# Patient Record
Sex: Male | Born: 1940
Health system: Southern US, Community
[De-identification: ages and names within clinical notes are randomized; demographics above are authoritative.]

## PROBLEM LIST (undated history)

## (undated) DIAGNOSIS — E785 Hyperlipidemia, unspecified: Secondary | ICD-10-CM

## (undated) DIAGNOSIS — C801 Malignant (primary) neoplasm, unspecified: Secondary | ICD-10-CM

## (undated) DIAGNOSIS — I428 Other cardiomyopathies: Secondary | ICD-10-CM

## (undated) DIAGNOSIS — I10 Essential (primary) hypertension: Secondary | ICD-10-CM

## (undated) DIAGNOSIS — E7800 Pure hypercholesterolemia, unspecified: Secondary | ICD-10-CM

## (undated) DIAGNOSIS — C8590 Non-Hodgkin lymphoma, unspecified, unspecified site: Secondary | ICD-10-CM

## (undated) DIAGNOSIS — M1990 Unspecified osteoarthritis, unspecified site: Secondary | ICD-10-CM

## (undated) HISTORY — DX: Hyperlipidemia, unspecified: E78.5

## (undated) HISTORY — DX: Other cardiomyopathies: I42.8

## (undated) HISTORY — DX: Non-Hodgkin lymphoma, unspecified, unspecified site: C85.90

## (undated) HISTORY — PX: THYROIDECTOMY, PARTIAL: SHX18

## (undated) HISTORY — DX: Pure hypercholesterolemia, unspecified: E78.00

## (undated) HISTORY — PX: OTHER SURGICAL HISTORY: SHX169

## (undated) HISTORY — DX: Essential (primary) hypertension: I10

## (undated) HISTORY — DX: Unspecified osteoarthritis, unspecified site: M19.90

## (undated) MED FILL — Rituximab-pvvr IV Soln 500 MG/50ML (10 MG/ML): INTRAVENOUS | Qty: 80 | Status: AC

---

## 2007-11-25 ENCOUNTER — Ambulatory Visit: Payer: Self-pay | Admitting: Oncology

## 2007-12-19 LAB — CBC WITH DIFFERENTIAL/PLATELET
BASO%: 0.5 % (ref 0.0–2.0)
Basophils Absolute: 0 10*3/uL (ref 0.0–0.1)
EOS%: 2.2 % (ref 0.0–7.0)
Eosinophils Absolute: 0.1 10*3/uL (ref 0.0–0.5)
HCT: 39.8 % (ref 38.7–49.9)
HGB: 14.2 g/dL (ref 13.0–17.1)
LYMPH%: 23 % (ref 14.0–48.0)
MCH: 31.5 pg (ref 28.0–33.4)
MCHC: 35.7 g/dL (ref 32.0–35.9)
MCV: 88.3 fL (ref 81.6–98.0)
MONO#: 0.7 10*3/uL (ref 0.1–0.9)
MONO%: 12.4 % (ref 0.0–13.0)
NEUT#: 3.6 10*3/uL (ref 1.5–6.5)
NEUT%: 61.9 % (ref 40.0–75.0)
Platelets: 204 10*3/uL (ref 145–400)
RBC: 4.51 10*6/uL (ref 4.20–5.71)
RDW: 14.2 % (ref 11.2–14.6)
WBC: 5.9 10*3/uL (ref 4.0–10.0)
lymph#: 1.4 10*3/uL (ref 0.9–3.3)

## 2007-12-19 LAB — COMPREHENSIVE METABOLIC PANEL
ALT: 25 U/L (ref 0–53)
AST: 14 U/L (ref 0–37)
Albumin: 4.5 g/dL (ref 3.5–5.2)
Alkaline Phosphatase: 47 U/L (ref 39–117)
BUN: 21 mg/dL (ref 6–23)
CO2: 26 mEq/L (ref 19–32)
Calcium: 8.9 mg/dL (ref 8.4–10.5)
Chloride: 104 mEq/L (ref 96–112)
Creatinine, Ser: 1.13 mg/dL (ref 0.40–1.50)
Glucose, Bld: 91 mg/dL (ref 70–99)
Potassium: 4 mEq/L (ref 3.5–5.3)
Sodium: 141 mEq/L (ref 135–145)
Total Bilirubin: 0.4 mg/dL (ref 0.3–1.2)
Total Protein: 6.6 g/dL (ref 6.0–8.3)

## 2007-12-19 LAB — LACTATE DEHYDROGENASE: LDH: 150 U/L (ref 94–250)

## 2007-12-19 LAB — URIC ACID: Uric Acid, Serum: 8.1 mg/dL — ABNORMAL HIGH (ref 4.0–7.8)

## 2007-12-27 ENCOUNTER — Ambulatory Visit: Admission: RE | Admit: 2007-12-27 | Discharge: 2007-12-27 | Payer: Self-pay | Admitting: Oncology

## 2007-12-27 ENCOUNTER — Encounter (HOSPITAL_COMMUNITY): Payer: Self-pay | Admitting: Oncology

## 2007-12-28 ENCOUNTER — Ambulatory Visit (HOSPITAL_COMMUNITY): Admission: RE | Admit: 2007-12-28 | Discharge: 2007-12-28 | Payer: Self-pay | Admitting: Oncology

## 2008-01-26 ENCOUNTER — Ambulatory Visit (HOSPITAL_COMMUNITY): Admission: RE | Admit: 2008-01-26 | Discharge: 2008-01-26 | Payer: Self-pay | Admitting: Oncology

## 2008-02-01 ENCOUNTER — Ambulatory Visit: Payer: Self-pay | Admitting: Oncology

## 2008-02-03 LAB — COMPREHENSIVE METABOLIC PANEL
ALT: 28 U/L (ref 0–53)
AST: 14 U/L (ref 0–37)
Albumin: 4.3 g/dL (ref 3.5–5.2)
Alkaline Phosphatase: 54 U/L (ref 39–117)
BUN: 19 mg/dL (ref 6–23)
CO2: 26 mEq/L (ref 19–32)
Calcium: 9.2 mg/dL (ref 8.4–10.5)
Chloride: 104 mEq/L (ref 96–112)
Creatinine, Ser: 1.04 mg/dL (ref 0.40–1.50)
Glucose, Bld: 102 mg/dL — ABNORMAL HIGH (ref 70–99)
Potassium: 4 mEq/L (ref 3.5–5.3)
Sodium: 138 mEq/L (ref 135–145)
Total Bilirubin: 0.5 mg/dL (ref 0.3–1.2)
Total Protein: 6.7 g/dL (ref 6.0–8.3)

## 2008-02-03 LAB — CBC WITH DIFFERENTIAL/PLATELET
BASO%: 0.4 % (ref 0.0–2.0)
Basophils Absolute: 0 10*3/uL (ref 0.0–0.1)
EOS%: 2 % (ref 0.0–7.0)
Eosinophils Absolute: 0.1 10*3/uL (ref 0.0–0.5)
HCT: 42.8 % (ref 38.7–49.9)
HGB: 15 g/dL (ref 13.0–17.1)
LYMPH%: 21.1 % (ref 14.0–48.0)
MCH: 31.9 pg (ref 28.0–33.4)
MCHC: 35 g/dL (ref 32.0–35.9)
MCV: 91.3 fL (ref 81.6–98.0)
MONO#: 0.7 10*3/uL (ref 0.1–0.9)
MONO%: 13.2 % — ABNORMAL HIGH (ref 0.0–13.0)
NEUT#: 3.4 10*3/uL (ref 1.5–6.5)
NEUT%: 63.3 % (ref 40.0–75.0)
Platelets: 178 10*3/uL (ref 145–400)
RBC: 4.69 10*6/uL (ref 4.20–5.71)
RDW: 13.9 % (ref 11.2–14.6)
WBC: 5.4 10*3/uL (ref 4.0–10.0)
lymph#: 1.1 10*3/uL (ref 0.9–3.3)

## 2008-02-03 LAB — ERYTHROCYTE SEDIMENTATION RATE: Sed Rate: 0 mm/hr (ref 0–20)

## 2008-02-03 LAB — TSH: TSH: 1.706 u[IU]/mL (ref 0.350–4.500)

## 2008-02-03 LAB — LACTATE DEHYDROGENASE: LDH: 143 U/L (ref 94–250)

## 2008-02-15 LAB — CBC WITH DIFFERENTIAL/PLATELET
BASO%: 1 % (ref 0.0–2.0)
Basophils Absolute: 0.1 10*3/uL (ref 0.0–0.1)
EOS%: 2.5 % (ref 0.0–7.0)
Eosinophils Absolute: 0.1 10*3/uL (ref 0.0–0.5)
HCT: 39.9 % (ref 38.7–49.9)
HGB: 14.1 g/dL (ref 13.0–17.1)
LYMPH%: 24.4 % (ref 14.0–48.0)
MCH: 31.4 pg (ref 28.0–33.4)
MCHC: 35.4 g/dL (ref 32.0–35.9)
MCV: 88.6 fL (ref 81.6–98.0)
MONO#: 0.7 10*3/uL (ref 0.1–0.9)
MONO%: 13.9 % — ABNORMAL HIGH (ref 0.0–13.0)
NEUT#: 3.1 10*3/uL (ref 1.5–6.5)
NEUT%: 58.3 % (ref 40.0–75.0)
Platelets: 197 10*3/uL (ref 145–400)
RBC: 4.5 10*6/uL (ref 4.20–5.71)
RDW: 12.4 % (ref 11.2–14.6)
WBC: 5.3 10*3/uL (ref 4.0–10.0)
lymph#: 1.3 10*3/uL (ref 0.9–3.3)

## 2008-02-15 LAB — COMPREHENSIVE METABOLIC PANEL
ALT: 31 U/L (ref 0–53)
AST: 15 U/L (ref 0–37)
Albumin: 4.2 g/dL (ref 3.5–5.2)
Alkaline Phosphatase: 49 U/L (ref 39–117)
BUN: 18 mg/dL (ref 6–23)
CO2: 26 mEq/L (ref 19–32)
Calcium: 8.8 mg/dL (ref 8.4–10.5)
Chloride: 104 mEq/L (ref 96–112)
Creatinine, Ser: 0.99 mg/dL (ref 0.40–1.50)
Glucose, Bld: 83 mg/dL (ref 70–99)
Potassium: 4.4 mEq/L (ref 3.5–5.3)
Sodium: 139 mEq/L (ref 135–145)
Total Bilirubin: 0.5 mg/dL (ref 0.3–1.2)
Total Protein: 6.3 g/dL (ref 6.0–8.3)

## 2008-02-15 LAB — LACTATE DEHYDROGENASE: LDH: 144 U/L (ref 94–250)

## 2008-04-06 ENCOUNTER — Ambulatory Visit: Payer: Self-pay | Admitting: Oncology

## 2008-04-10 LAB — COMPREHENSIVE METABOLIC PANEL
ALT: 37 U/L (ref 0–53)
AST: 16 U/L (ref 0–37)
Albumin: 4.4 g/dL (ref 3.5–5.2)
Alkaline Phosphatase: 51 U/L (ref 39–117)
BUN: 16 mg/dL (ref 6–23)
CO2: 24 mEq/L (ref 19–32)
Calcium: 9.3 mg/dL (ref 8.4–10.5)
Chloride: 104 mEq/L (ref 96–112)
Creatinine, Ser: 1.04 mg/dL (ref 0.40–1.50)
Glucose, Bld: 98 mg/dL (ref 70–99)
Potassium: 4.3 mEq/L (ref 3.5–5.3)
Sodium: 139 mEq/L (ref 135–145)
Total Bilirubin: 0.5 mg/dL (ref 0.3–1.2)
Total Protein: 6.6 g/dL (ref 6.0–8.3)

## 2008-04-10 LAB — CBC WITH DIFFERENTIAL/PLATELET
BASO%: 0.8 % (ref 0.0–2.0)
Basophils Absolute: 0 10*3/uL (ref 0.0–0.1)
EOS%: 2.7 % (ref 0.0–7.0)
Eosinophils Absolute: 0.2 10*3/uL (ref 0.0–0.5)
HCT: 42.5 % (ref 38.7–49.9)
HGB: 15 g/dL (ref 13.0–17.1)
LYMPH%: 23.7 % (ref 14.0–48.0)
MCH: 30.9 pg (ref 28.0–33.4)
MCHC: 35.4 g/dL (ref 32.0–35.9)
MCV: 87.5 fL (ref 81.6–98.0)
MONO#: 0.8 10*3/uL (ref 0.1–0.9)
MONO%: 12.7 % (ref 0.0–13.0)
NEUT#: 3.7 10*3/uL (ref 1.5–6.5)
NEUT%: 60.1 % (ref 40.0–75.0)
Platelets: 213 10*3/uL (ref 145–400)
RBC: 4.85 10*6/uL (ref 4.20–5.71)
RDW: 12.3 % (ref 11.2–14.6)
WBC: 6.2 10*3/uL (ref 4.0–10.0)
lymph#: 1.5 10*3/uL (ref 0.9–3.3)

## 2008-04-10 LAB — LACTATE DEHYDROGENASE: LDH: 152 U/L (ref 94–250)

## 2008-06-05 ENCOUNTER — Ambulatory Visit (HOSPITAL_COMMUNITY): Admission: RE | Admit: 2008-06-05 | Discharge: 2008-06-05 | Payer: Self-pay | Admitting: Oncology

## 2008-06-08 ENCOUNTER — Ambulatory Visit: Payer: Self-pay | Admitting: Oncology

## 2008-06-12 LAB — CBC WITH DIFFERENTIAL/PLATELET
BASO%: 1.3 % (ref 0.0–2.0)
Basophils Absolute: 0.1 10*3/uL (ref 0.0–0.1)
EOS%: 2.1 % (ref 0.0–7.0)
Eosinophils Absolute: 0.1 10*3/uL (ref 0.0–0.5)
HCT: 43.4 % (ref 38.7–49.9)
HGB: 15.4 g/dL (ref 13.0–17.1)
LYMPH%: 26.8 % (ref 14.0–48.0)
MCH: 31.1 pg (ref 28.0–33.4)
MCHC: 35.4 g/dL (ref 32.0–35.9)
MCV: 87.9 fL (ref 81.6–98.0)
MONO#: 0.8 10*3/uL (ref 0.1–0.9)
MONO%: 13 % (ref 0.0–13.0)
NEUT#: 3.4 10*3/uL (ref 1.5–6.5)
NEUT%: 56.8 % (ref 40.0–75.0)
Platelets: 213 10*3/uL (ref 145–400)
RBC: 4.94 10*6/uL (ref 4.20–5.71)
RDW: 12.2 % (ref 11.2–14.6)
WBC: 5.9 10*3/uL (ref 4.0–10.0)
lymph#: 1.6 10*3/uL (ref 0.9–3.3)

## 2008-06-12 LAB — COMPREHENSIVE METABOLIC PANEL
ALT: 32 U/L (ref 0–53)
AST: 13 U/L (ref 0–37)
Albumin: 4.3 g/dL (ref 3.5–5.2)
Alkaline Phosphatase: 47 U/L (ref 39–117)
BUN: 21 mg/dL (ref 6–23)
CO2: 25 mEq/L (ref 19–32)
Calcium: 9.2 mg/dL (ref 8.4–10.5)
Chloride: 103 mEq/L (ref 96–112)
Creatinine, Ser: 0.99 mg/dL (ref 0.40–1.50)
Glucose, Bld: 96 mg/dL (ref 70–99)
Potassium: 4.4 mEq/L (ref 3.5–5.3)
Sodium: 140 mEq/L (ref 135–145)
Total Bilirubin: 0.6 mg/dL (ref 0.3–1.2)
Total Protein: 6.8 g/dL (ref 6.0–8.3)

## 2008-06-12 LAB — LACTATE DEHYDROGENASE: LDH: 142 U/L (ref 94–250)

## 2008-08-07 ENCOUNTER — Ambulatory Visit (HOSPITAL_COMMUNITY): Admission: RE | Admit: 2008-08-07 | Discharge: 2008-08-07 | Payer: Self-pay | Admitting: Oncology

## 2008-08-09 ENCOUNTER — Ambulatory Visit: Payer: Self-pay | Admitting: Oncology

## 2008-08-13 LAB — COMPREHENSIVE METABOLIC PANEL
ALT: 24 U/L (ref 0–53)
AST: 13 U/L (ref 0–37)
Albumin: 4.4 g/dL (ref 3.5–5.2)
Alkaline Phosphatase: 47 U/L (ref 39–117)
BUN: 28 mg/dL — ABNORMAL HIGH (ref 6–23)
CO2: 24 mEq/L (ref 19–32)
Calcium: 8.9 mg/dL (ref 8.4–10.5)
Chloride: 105 mEq/L (ref 96–112)
Creatinine, Ser: 1.08 mg/dL (ref 0.40–1.50)
Glucose, Bld: 85 mg/dL (ref 70–99)
Potassium: 4.5 mEq/L (ref 3.5–5.3)
Sodium: 139 mEq/L (ref 135–145)
Total Bilirubin: 0.5 mg/dL (ref 0.3–1.2)
Total Protein: 6.7 g/dL (ref 6.0–8.3)

## 2008-08-13 LAB — CBC WITH DIFFERENTIAL/PLATELET
BASO%: 0.6 % (ref 0.0–2.0)
Basophils Absolute: 0 10*3/uL (ref 0.0–0.1)
EOS%: 1.5 % (ref 0.0–7.0)
Eosinophils Absolute: 0.1 10*3/uL (ref 0.0–0.5)
HCT: 44.1 % (ref 38.4–49.9)
HGB: 15.5 g/dL (ref 13.0–17.1)
LYMPH%: 24.2 % (ref 14.0–49.0)
MCH: 30.8 pg (ref 27.2–33.4)
MCHC: 35.1 g/dL (ref 32.0–36.0)
MCV: 87.7 fL (ref 79.3–98.0)
MONO#: 0.8 10*3/uL (ref 0.1–0.9)
MONO%: 14.3 % — ABNORMAL HIGH (ref 0.0–14.0)
NEUT#: 3.2 10*3/uL (ref 1.5–6.5)
NEUT%: 59.4 % (ref 39.0–75.0)
Platelets: 165 10*3/uL (ref 140–400)
RBC: 5.03 10*6/uL (ref 4.20–5.82)
RDW: 13.4 % (ref 11.0–14.6)
WBC: 5.3 10*3/uL (ref 4.0–10.3)
lymph#: 1.3 10*3/uL (ref 0.9–3.3)
nRBC: 0 % (ref 0–0)

## 2008-08-13 LAB — LACTATE DEHYDROGENASE: LDH: 163 U/L (ref 94–250)

## 2008-10-09 ENCOUNTER — Ambulatory Visit: Payer: Self-pay | Admitting: Oncology

## 2008-10-11 LAB — CBC WITH DIFFERENTIAL/PLATELET
BASO%: 0.5 % (ref 0.0–2.0)
Basophils Absolute: 0 10*3/uL (ref 0.0–0.1)
EOS%: 1.4 % (ref 0.0–7.0)
Eosinophils Absolute: 0.1 10*3/uL (ref 0.0–0.5)
HCT: 42.7 % (ref 38.4–49.9)
HGB: 15.2 g/dL (ref 13.0–17.1)
LYMPH%: 30.2 % (ref 14.0–49.0)
MCH: 31 pg (ref 27.2–33.4)
MCHC: 35.6 g/dL (ref 32.0–36.0)
MCV: 87.1 fL (ref 79.3–98.0)
MONO#: 0.5 10*3/uL (ref 0.1–0.9)
MONO%: 11.8 % (ref 0.0–14.0)
NEUT#: 2.4 10*3/uL (ref 1.5–6.5)
NEUT%: 56.1 % (ref 39.0–75.0)
Platelets: 149 10*3/uL (ref 140–400)
RBC: 4.9 10*6/uL (ref 4.20–5.82)
RDW: 13.3 % (ref 11.0–14.6)
WBC: 4.2 10*3/uL (ref 4.0–10.3)
lymph#: 1.3 10*3/uL (ref 0.9–3.3)

## 2008-10-11 LAB — COMPREHENSIVE METABOLIC PANEL
ALT: 16 U/L (ref 0–53)
AST: 10 U/L (ref 0–37)
Albumin: 4.5 g/dL (ref 3.5–5.2)
Alkaline Phosphatase: 51 U/L (ref 39–117)
BUN: 17 mg/dL (ref 6–23)
CO2: 24 mEq/L (ref 19–32)
Calcium: 9 mg/dL (ref 8.4–10.5)
Chloride: 102 mEq/L (ref 96–112)
Creatinine, Ser: 0.98 mg/dL (ref 0.40–1.50)
Glucose, Bld: 90 mg/dL (ref 70–99)
Potassium: 4.2 mEq/L (ref 3.5–5.3)
Sodium: 139 mEq/L (ref 135–145)
Total Bilirubin: 0.6 mg/dL (ref 0.3–1.2)
Total Protein: 6.6 g/dL (ref 6.0–8.3)

## 2008-10-11 LAB — LACTATE DEHYDROGENASE: LDH: 128 U/L (ref 94–250)

## 2008-12-07 ENCOUNTER — Ambulatory Visit: Payer: Self-pay | Admitting: Oncology

## 2008-12-11 LAB — COMPREHENSIVE METABOLIC PANEL
ALT: 17 U/L (ref 0–53)
AST: 11 U/L (ref 0–37)
Albumin: 4.3 g/dL (ref 3.5–5.2)
Alkaline Phosphatase: 50 U/L (ref 39–117)
BUN: 23 mg/dL (ref 6–23)
CO2: 25 mEq/L (ref 19–32)
Calcium: 9.5 mg/dL (ref 8.4–10.5)
Chloride: 104 mEq/L (ref 96–112)
Creatinine, Ser: 1.05 mg/dL (ref 0.40–1.50)
Glucose, Bld: 85 mg/dL (ref 70–99)
Potassium: 4.4 mEq/L (ref 3.5–5.3)
Sodium: 139 mEq/L (ref 135–145)
Total Bilirubin: 0.5 mg/dL (ref 0.3–1.2)
Total Protein: 6.6 g/dL (ref 6.0–8.3)

## 2008-12-11 LAB — CBC WITH DIFFERENTIAL/PLATELET
BASO%: 0.5 % (ref 0.0–2.0)
Basophils Absolute: 0 10*3/uL (ref 0.0–0.1)
EOS%: 1.7 % (ref 0.0–7.0)
Eosinophils Absolute: 0.1 10*3/uL (ref 0.0–0.5)
HCT: 41.8 % (ref 38.4–49.9)
HGB: 14.9 g/dL (ref 13.0–17.1)
LYMPH%: 27.8 % (ref 14.0–49.0)
MCH: 31.9 pg (ref 27.2–33.4)
MCHC: 35.8 g/dL (ref 32.0–36.0)
MCV: 89 fL (ref 79.3–98.0)
MONO#: 0.5 10*3/uL (ref 0.1–0.9)
MONO%: 10.1 % (ref 0.0–14.0)
NEUT#: 3.1 10*3/uL (ref 1.5–6.5)
NEUT%: 59.9 % (ref 39.0–75.0)
Platelets: 161 10*3/uL (ref 140–400)
RBC: 4.69 10*6/uL (ref 4.20–5.82)
RDW: 13.6 % (ref 11.0–14.6)
WBC: 5.1 10*3/uL (ref 4.0–10.3)
lymph#: 1.4 10*3/uL (ref 0.9–3.3)

## 2008-12-11 LAB — LACTATE DEHYDROGENASE: LDH: 128 U/L (ref 94–250)

## 2009-02-12 ENCOUNTER — Ambulatory Visit (HOSPITAL_COMMUNITY): Admission: RE | Admit: 2009-02-12 | Discharge: 2009-02-12 | Payer: Self-pay | Admitting: Oncology

## 2009-02-12 ENCOUNTER — Ambulatory Visit: Payer: Self-pay | Admitting: Oncology

## 2009-02-14 LAB — CBC WITH DIFFERENTIAL/PLATELET
BASO%: 0.5 % (ref 0.0–2.0)
Basophils Absolute: 0 10*3/uL (ref 0.0–0.1)
EOS%: 1.3 % (ref 0.0–7.0)
Eosinophils Absolute: 0.1 10*3/uL (ref 0.0–0.5)
HCT: 42.9 % (ref 38.4–49.9)
HGB: 14.9 g/dL (ref 13.0–17.1)
LYMPH%: 30.6 % (ref 14.0–49.0)
MCH: 30.5 pg (ref 27.2–33.4)
MCHC: 34.7 g/dL (ref 32.0–36.0)
MCV: 87.7 fL (ref 79.3–98.0)
MONO#: 0.6 10*3/uL (ref 0.1–0.9)
MONO%: 11.5 % (ref 0.0–14.0)
NEUT#: 3.1 10*3/uL (ref 1.5–6.5)
NEUT%: 56.1 % (ref 39.0–75.0)
Platelets: 141 10*3/uL (ref 140–400)
RBC: 4.89 10*6/uL (ref 4.20–5.82)
RDW: 13.7 % (ref 11.0–14.6)
WBC: 5.5 10*3/uL (ref 4.0–10.3)
lymph#: 1.7 10*3/uL (ref 0.9–3.3)
nRBC: 0 % (ref 0–0)

## 2009-02-14 LAB — COMPREHENSIVE METABOLIC PANEL
ALT: 18 U/L (ref 0–53)
AST: 11 U/L (ref 0–37)
Albumin: 4.2 g/dL (ref 3.5–5.2)
Alkaline Phosphatase: 44 U/L (ref 39–117)
BUN: 21 mg/dL (ref 6–23)
CO2: 25 mEq/L (ref 19–32)
Calcium: 9 mg/dL (ref 8.4–10.5)
Chloride: 103 mEq/L (ref 96–112)
Creatinine, Ser: 0.95 mg/dL (ref 0.40–1.50)
Glucose, Bld: 93 mg/dL (ref 70–99)
Potassium: 4.2 mEq/L (ref 3.5–5.3)
Sodium: 139 mEq/L (ref 135–145)
Total Bilirubin: 0.7 mg/dL (ref 0.3–1.2)
Total Protein: 6.4 g/dL (ref 6.0–8.3)

## 2009-02-14 LAB — LACTATE DEHYDROGENASE: LDH: 137 U/L (ref 94–250)

## 2009-03-04 ENCOUNTER — Ambulatory Visit (HOSPITAL_COMMUNITY): Admission: RE | Admit: 2009-03-04 | Discharge: 2009-03-05 | Payer: Self-pay | Admitting: Otolaryngology

## 2009-03-04 ENCOUNTER — Encounter (INDEPENDENT_AMBULATORY_CARE_PROVIDER_SITE_OTHER): Payer: Self-pay | Admitting: Otolaryngology

## 2009-04-12 ENCOUNTER — Ambulatory Visit: Payer: Self-pay | Admitting: Oncology

## 2009-04-16 LAB — CBC WITH DIFFERENTIAL/PLATELET
BASO%: 0.7 % (ref 0.0–2.0)
Basophils Absolute: 0 10*3/uL (ref 0.0–0.1)
EOS%: 2 % (ref 0.0–7.0)
Eosinophils Absolute: 0.1 10*3/uL (ref 0.0–0.5)
HCT: 43.7 % (ref 38.4–49.9)
HGB: 15.1 g/dL (ref 13.0–17.1)
LYMPH%: 29.4 % (ref 14.0–49.0)
MCH: 30.6 pg (ref 27.2–33.4)
MCHC: 34.6 g/dL (ref 32.0–36.0)
MCV: 88.6 fL (ref 79.3–98.0)
MONO#: 0.5 10*3/uL (ref 0.1–0.9)
MONO%: 9.3 % (ref 0.0–14.0)
NEUT#: 3.3 10*3/uL (ref 1.5–6.5)
NEUT%: 58.6 % (ref 39.0–75.0)
Platelets: 169 10*3/uL (ref 140–400)
RBC: 4.93 10*6/uL (ref 4.20–5.82)
RDW: 13.8 % (ref 11.0–14.6)
WBC: 5.6 10*3/uL (ref 4.0–10.3)
lymph#: 1.6 10*3/uL (ref 0.9–3.3)
nRBC: 0 % (ref 0–0)

## 2009-04-16 LAB — LACTATE DEHYDROGENASE: LDH: 138 U/L (ref 94–250)

## 2009-04-16 LAB — COMPREHENSIVE METABOLIC PANEL
ALT: 12 U/L (ref 0–53)
AST: 9 U/L (ref 0–37)
Albumin: 4.3 g/dL (ref 3.5–5.2)
Alkaline Phosphatase: 49 U/L (ref 39–117)
BUN: 19 mg/dL (ref 6–23)
CO2: 26 mEq/L (ref 19–32)
Calcium: 9.1 mg/dL (ref 8.4–10.5)
Chloride: 105 mEq/L (ref 96–112)
Creatinine, Ser: 1.18 mg/dL (ref 0.40–1.50)
Glucose, Bld: 92 mg/dL (ref 70–99)
Potassium: 4.1 mEq/L (ref 3.5–5.3)
Sodium: 141 mEq/L (ref 135–145)
Total Bilirubin: 0.4 mg/dL (ref 0.3–1.2)
Total Protein: 6.4 g/dL (ref 6.0–8.3)

## 2009-06-17 ENCOUNTER — Ambulatory Visit: Payer: Self-pay | Admitting: Oncology

## 2009-06-20 LAB — COMPREHENSIVE METABOLIC PANEL
ALT: 15 U/L (ref 0–53)
AST: 10 U/L (ref 0–37)
Albumin: 4.4 g/dL (ref 3.5–5.2)
Alkaline Phosphatase: 45 U/L (ref 39–117)
BUN: 16 mg/dL (ref 6–23)
CO2: 26 mEq/L (ref 19–32)
Calcium: 9.1 mg/dL (ref 8.4–10.5)
Chloride: 103 mEq/L (ref 96–112)
Creatinine, Ser: 0.95 mg/dL (ref 0.40–1.50)
Glucose, Bld: 88 mg/dL (ref 70–99)
Potassium: 4.2 mEq/L (ref 3.5–5.3)
Sodium: 139 mEq/L (ref 135–145)
Total Bilirubin: 0.5 mg/dL (ref 0.3–1.2)
Total Protein: 6.5 g/dL (ref 6.0–8.3)

## 2009-06-20 LAB — CBC WITH DIFFERENTIAL/PLATELET
BASO%: 0.5 % (ref 0.0–2.0)
Basophils Absolute: 0 10*3/uL (ref 0.0–0.1)
EOS%: 2.3 % (ref 0.0–7.0)
Eosinophils Absolute: 0.1 10*3/uL (ref 0.0–0.5)
HCT: 45.4 % (ref 38.4–49.9)
HGB: 15.6 g/dL (ref 13.0–17.1)
LYMPH%: 29 % (ref 14.0–49.0)
MCH: 30.5 pg (ref 27.2–33.4)
MCHC: 34.4 g/dL (ref 32.0–36.0)
MCV: 88.8 fL (ref 79.3–98.0)
MONO#: 0.7 10*3/uL (ref 0.1–0.9)
MONO%: 11.2 % (ref 0.0–14.0)
NEUT#: 3.5 10*3/uL (ref 1.5–6.5)
NEUT%: 57 % (ref 39.0–75.0)
Platelets: 144 10*3/uL (ref 140–400)
RBC: 5.11 10*6/uL (ref 4.20–5.82)
RDW: 13.7 % (ref 11.0–14.6)
WBC: 6.1 10*3/uL (ref 4.0–10.3)
lymph#: 1.8 10*3/uL (ref 0.9–3.3)

## 2009-06-20 LAB — LACTATE DEHYDROGENASE: LDH: 129 U/L (ref 94–250)

## 2009-08-15 ENCOUNTER — Ambulatory Visit: Payer: Self-pay | Admitting: Oncology

## 2009-10-16 ENCOUNTER — Ambulatory Visit: Payer: Self-pay | Admitting: Oncology

## 2009-10-17 LAB — COMPREHENSIVE METABOLIC PANEL
ALT: 10 U/L (ref 0–53)
AST: 9 U/L (ref 0–37)
Albumin: 4.3 g/dL (ref 3.5–5.2)
Alkaline Phosphatase: 44 U/L (ref 39–117)
BUN: 17 mg/dL (ref 6–23)
CO2: 26 mEq/L (ref 19–32)
Calcium: 8.9 mg/dL (ref 8.4–10.5)
Chloride: 104 mEq/L (ref 96–112)
Creatinine, Ser: 1.02 mg/dL (ref 0.40–1.50)
Glucose, Bld: 98 mg/dL (ref 70–99)
Potassium: 4.2 mEq/L (ref 3.5–5.3)
Sodium: 140 mEq/L (ref 135–145)
Total Bilirubin: 0.6 mg/dL (ref 0.3–1.2)
Total Protein: 6.3 g/dL (ref 6.0–8.3)

## 2009-10-17 LAB — CBC WITH DIFFERENTIAL/PLATELET
BASO%: 0.5 % (ref 0.0–2.0)
Basophils Absolute: 0 10*3/uL (ref 0.0–0.1)
EOS%: 1.9 % (ref 0.0–7.0)
Eosinophils Absolute: 0.1 10*3/uL (ref 0.0–0.5)
HCT: 44.4 % (ref 38.4–49.9)
HGB: 15.3 g/dL (ref 13.0–17.1)
LYMPH%: 28.1 % (ref 14.0–49.0)
MCH: 30.4 pg (ref 27.2–33.4)
MCHC: 34.5 g/dL (ref 32.0–36.0)
MCV: 88.1 fL (ref 79.3–98.0)
MONO#: 0.8 10*3/uL (ref 0.1–0.9)
MONO%: 12.2 % (ref 0.0–14.0)
NEUT#: 3.6 10*3/uL (ref 1.5–6.5)
NEUT%: 57.3 % (ref 39.0–75.0)
Platelets: 144 10*3/uL (ref 140–400)
RBC: 5.04 10*6/uL (ref 4.20–5.82)
RDW: 13.2 % (ref 11.0–14.6)
WBC: 6.2 10*3/uL (ref 4.0–10.3)
lymph#: 1.8 10*3/uL (ref 0.9–3.3)
nRBC: 0 % (ref 0–0)

## 2009-10-17 LAB — LACTATE DEHYDROGENASE: LDH: 118 U/L (ref 94–250)

## 2009-12-10 ENCOUNTER — Ambulatory Visit: Payer: Self-pay | Admitting: Oncology

## 2010-01-31 ENCOUNTER — Ambulatory Visit: Payer: Self-pay | Admitting: Oncology

## 2010-02-04 ENCOUNTER — Ambulatory Visit (HOSPITAL_COMMUNITY): Admission: RE | Admit: 2010-02-04 | Discharge: 2010-02-04 | Payer: Self-pay | Admitting: Oncology

## 2010-02-04 LAB — CBC WITH DIFFERENTIAL/PLATELET
BASO%: 0.6 % (ref 0.0–2.0)
Basophils Absolute: 0 10*3/uL (ref 0.0–0.1)
EOS%: 1.5 % (ref 0.0–7.0)
Eosinophils Absolute: 0.1 10*3/uL (ref 0.0–0.5)
HCT: 42.9 % (ref 38.4–49.9)
HGB: 14.9 g/dL (ref 13.0–17.1)
LYMPH%: 34.2 % (ref 14.0–49.0)
MCH: 30.5 pg (ref 27.2–33.4)
MCHC: 34.7 g/dL (ref 32.0–36.0)
MCV: 87.9 fL (ref 79.3–98.0)
MONO#: 0.5 10*3/uL (ref 0.1–0.9)
MONO%: 9.7 % (ref 0.0–14.0)
NEUT#: 2.6 10*3/uL (ref 1.5–6.5)
NEUT%: 54 % (ref 39.0–75.0)
Platelets: 153 10*3/uL (ref 140–400)
RBC: 4.88 10*6/uL (ref 4.20–5.82)
RDW: 14 % (ref 11.0–14.6)
WBC: 4.7 10*3/uL (ref 4.0–10.3)
lymph#: 1.6 10*3/uL (ref 0.9–3.3)
nRBC: 0 % (ref 0–0)

## 2010-02-04 LAB — COMPREHENSIVE METABOLIC PANEL
ALT: 14 U/L (ref 0–53)
AST: 15 U/L (ref 0–37)
Albumin: 4.1 g/dL (ref 3.5–5.2)
Alkaline Phosphatase: 42 U/L (ref 39–117)
BUN: 18 mg/dL (ref 6–23)
CO2: 28 mEq/L (ref 19–32)
Calcium: 9.3 mg/dL (ref 8.4–10.5)
Chloride: 105 mEq/L (ref 96–112)
Creatinine, Ser: 1.1 mg/dL (ref 0.40–1.50)
Glucose, Bld: 107 mg/dL — ABNORMAL HIGH (ref 70–99)
Potassium: 4.4 mEq/L (ref 3.5–5.3)
Sodium: 140 mEq/L (ref 135–145)
Total Bilirubin: 1 mg/dL (ref 0.3–1.2)
Total Protein: 6.8 g/dL (ref 6.0–8.3)

## 2010-02-04 LAB — LACTATE DEHYDROGENASE: LDH: 124 U/L (ref 94–250)

## 2010-04-04 ENCOUNTER — Ambulatory Visit: Payer: Self-pay | Admitting: Oncology

## 2010-06-06 ENCOUNTER — Ambulatory Visit: Payer: Self-pay | Admitting: Oncology

## 2010-08-11 ENCOUNTER — Other Ambulatory Visit (HOSPITAL_COMMUNITY): Payer: Self-pay | Admitting: Oncology

## 2010-08-11 ENCOUNTER — Encounter (HOSPITAL_BASED_OUTPATIENT_CLINIC_OR_DEPARTMENT_OTHER): Payer: Medicare Other | Admitting: Oncology

## 2010-08-11 DIAGNOSIS — C8299 Follicular lymphoma, unspecified, extranodal and solid organ sites: Secondary | ICD-10-CM

## 2010-08-11 DIAGNOSIS — Z452 Encounter for adjustment and management of vascular access device: Secondary | ICD-10-CM

## 2010-08-11 DIAGNOSIS — C8590 Non-Hodgkin lymphoma, unspecified, unspecified site: Secondary | ICD-10-CM

## 2010-08-11 LAB — COMPREHENSIVE METABOLIC PANEL
ALT: 25 U/L (ref 0–53)
AST: 15 U/L (ref 0–37)
Albumin: 4.6 g/dL (ref 3.5–5.2)
Alkaline Phosphatase: 47 U/L (ref 39–117)
BUN: 18 mg/dL (ref 6–23)
CO2: 26 mEq/L (ref 19–32)
Calcium: 9.3 mg/dL (ref 8.4–10.5)
Chloride: 102 mEq/L (ref 96–112)
Creatinine, Ser: 1.06 mg/dL (ref 0.40–1.50)
Glucose, Bld: 100 mg/dL — ABNORMAL HIGH (ref 70–99)
Potassium: 4.3 mEq/L (ref 3.5–5.3)
Sodium: 139 mEq/L (ref 135–145)
Total Bilirubin: 0.6 mg/dL (ref 0.3–1.2)
Total Protein: 6.5 g/dL (ref 6.0–8.3)

## 2010-08-11 LAB — CBC WITH DIFFERENTIAL/PLATELET
BASO%: 0.8 % (ref 0.0–2.0)
Basophils Absolute: 0 10*3/uL (ref 0.0–0.1)
EOS%: 2.6 % (ref 0.0–7.0)
Eosinophils Absolute: 0.1 10*3/uL (ref 0.0–0.5)
HCT: 44.8 % (ref 38.4–49.9)
HGB: 15.6 g/dL (ref 13.0–17.1)
LYMPH%: 25.7 % (ref 14.0–49.0)
MCH: 30.1 pg (ref 27.2–33.4)
MCHC: 34.8 g/dL (ref 32.0–36.0)
MCV: 86.6 fL (ref 79.3–98.0)
MONO#: 0.6 10*3/uL (ref 0.1–0.9)
MONO%: 11.8 % (ref 0.0–14.0)
NEUT#: 3.2 10*3/uL (ref 1.5–6.5)
NEUT%: 59.1 % (ref 39.0–75.0)
Platelets: 177 10*3/uL (ref 140–400)
RBC: 5.17 10*6/uL (ref 4.20–5.82)
RDW: 13.2 % (ref 11.0–14.6)
WBC: 5.4 10*3/uL (ref 4.0–10.3)
lymph#: 1.4 10*3/uL (ref 0.9–3.3)

## 2010-08-11 LAB — LACTATE DEHYDROGENASE: LDH: 131 U/L (ref 94–250)

## 2010-09-26 LAB — BASIC METABOLIC PANEL
BUN: 19 mg/dL (ref 6–23)
CO2: 29 mEq/L (ref 19–32)
Calcium: 9.3 mg/dL (ref 8.4–10.5)
Chloride: 100 mEq/L (ref 96–112)
Creatinine, Ser: 1 mg/dL (ref 0.4–1.5)
GFR calc Af Amer: >60 mL/min (ref >60)
GFR calc non Af Amer: >60 mL/min (ref >60)
Glucose, Bld: 95 mg/dL (ref 70–99)
Potassium: 4.8 mEq/L (ref 3.5–5.1)
Sodium: 138 mEq/L (ref 135–145)

## 2010-09-26 LAB — CBC
HCT: 44.3 % (ref 39.0–52.0)
Hemoglobin: 15.2 g/dL (ref 13.0–17.0)
MCHC: 34.3 g/dL (ref 30.0–36.0)
MCV: 90.9 fL (ref 78.0–100.0)
Platelets: 167 10*3/uL (ref 150–400)
RBC: 4.88 MIL/uL (ref 4.22–5.81)
RDW: 13.7 % (ref 11.5–15.5)
WBC: 6.7 10*3/uL (ref 4.0–10.5)

## 2010-09-27 LAB — GLUCOSE, CAPILLARY: Glucose-Capillary: 104 mg/dL — ABNORMAL HIGH (ref 70–99)

## 2010-10-06 ENCOUNTER — Encounter (HOSPITAL_BASED_OUTPATIENT_CLINIC_OR_DEPARTMENT_OTHER): Payer: Medicare Other | Admitting: Oncology

## 2010-10-06 DIAGNOSIS — Z452 Encounter for adjustment and management of vascular access device: Secondary | ICD-10-CM

## 2010-10-06 DIAGNOSIS — C8299 Follicular lymphoma, unspecified, extranodal and solid organ sites: Secondary | ICD-10-CM

## 2010-12-01 ENCOUNTER — Encounter (HOSPITAL_BASED_OUTPATIENT_CLINIC_OR_DEPARTMENT_OTHER): Payer: Medicare Other | Admitting: Oncology

## 2010-12-01 DIAGNOSIS — Z452 Encounter for adjustment and management of vascular access device: Secondary | ICD-10-CM

## 2010-12-01 DIAGNOSIS — C8299 Follicular lymphoma, unspecified, extranodal and solid organ sites: Secondary | ICD-10-CM

## 2011-01-23 ENCOUNTER — Encounter (HOSPITAL_COMMUNITY): Payer: Self-pay

## 2011-01-23 ENCOUNTER — Other Ambulatory Visit (HOSPITAL_COMMUNITY): Payer: Self-pay | Admitting: Oncology

## 2011-01-23 ENCOUNTER — Ambulatory Visit (HOSPITAL_COMMUNITY)
Admission: RE | Admit: 2011-01-23 | Discharge: 2011-01-23 | Disposition: A | Discharge status: Home / Self Care | Payer: Medicare Other | Source: Ambulatory Visit | Attending: Oncology | Admitting: Oncology

## 2011-01-23 ENCOUNTER — Encounter (HOSPITAL_BASED_OUTPATIENT_CLINIC_OR_DEPARTMENT_OTHER): Payer: Medicare Other | Admitting: Oncology

## 2011-01-23 DIAGNOSIS — K7689 Other specified diseases of liver: Secondary | ICD-10-CM | POA: Insufficient documentation

## 2011-01-23 DIAGNOSIS — C8589 Other specified types of non-Hodgkin lymphoma, extranodal and solid organ sites: Secondary | ICD-10-CM | POA: Insufficient documentation

## 2011-01-23 DIAGNOSIS — R599 Enlarged lymph nodes, unspecified: Secondary | ICD-10-CM | POA: Insufficient documentation

## 2011-01-23 DIAGNOSIS — Q619 Cystic kidney disease, unspecified: Secondary | ICD-10-CM | POA: Insufficient documentation

## 2011-01-23 DIAGNOSIS — Z452 Encounter for adjustment and management of vascular access device: Secondary | ICD-10-CM

## 2011-01-23 DIAGNOSIS — C8299 Follicular lymphoma, unspecified, extranodal and solid organ sites: Secondary | ICD-10-CM

## 2011-01-23 DIAGNOSIS — C8590 Non-Hodgkin lymphoma, unspecified, unspecified site: Secondary | ICD-10-CM

## 2011-01-23 HISTORY — DX: Malignant (primary) neoplasm, unspecified: C80.1

## 2011-01-23 LAB — LACTATE DEHYDROGENASE: LDH: 131 U/L (ref 94–250)

## 2011-01-23 LAB — CBC WITH DIFFERENTIAL/PLATELET
BASO%: 0.5 % (ref 0.0–2.0)
Basophils Absolute: 0 10*3/uL (ref 0.0–0.1)
EOS%: 2 % (ref 0.0–7.0)
Eosinophils Absolute: 0.1 10*3/uL (ref 0.0–0.5)
HCT: 39.3 % (ref 38.4–49.9)
HGB: 13.6 g/dL (ref 13.0–17.1)
LYMPH%: 29.2 % (ref 14.0–49.0)
MCH: 30.4 pg (ref 27.2–33.4)
MCHC: 34.6 g/dL (ref 32.0–36.0)
MCV: 87.9 fL (ref 79.3–98.0)
MONO#: 0.6 10*3/uL (ref 0.1–0.9)
MONO%: 11 % (ref 0.0–14.0)
NEUT#: 2.9 10*3/uL (ref 1.5–6.5)
NEUT%: 57.3 % (ref 39.0–75.0)
Platelets: 137 10*3/uL — ABNORMAL LOW (ref 140–400)
RBC: 4.46 10*6/uL (ref 4.20–5.82)
RDW: 14 % (ref 11.0–14.6)
WBC: 5 10*3/uL (ref 4.0–10.3)
lymph#: 1.5 10*3/uL (ref 0.9–3.3)

## 2011-01-23 LAB — CMP (CANCER CENTER ONLY)
ALT(SGPT): 22 U/L (ref 10–47)
AST: 17 U/L (ref 11–38)
Albumin: 3.6 g/dL (ref 3.3–5.5)
Alkaline Phosphatase: 41 U/L (ref 26–84)
BUN, Bld: 18 mg/dL (ref 7–22)
CO2: 30 mEq/L (ref 18–33)
Calcium: 8.9 mg/dL (ref 8.0–10.3)
Chloride: 101 mEq/L (ref 98–108)
Creat: 1.1 mg/dl (ref 0.6–1.2)
Glucose, Bld: 116 mg/dL (ref 73–118)
Potassium: 4.5 mEq/L (ref 3.3–4.7)
Sodium: 144 mEq/L (ref 128–145)
Total Bilirubin: 0.7 mg/dl (ref 0.20–1.60)
Total Protein: 6.6 g/dL (ref 6.4–8.1)

## 2011-01-23 MED ORDER — IOHEXOL 300 MG/ML  SOLN
100.0000 mL | Freq: Once | INTRAMUSCULAR | Status: AC | PRN
  Administered 2011-01-23: 100 mL via INTRAVENOUS

## 2011-01-27 ENCOUNTER — Other Ambulatory Visit (HOSPITAL_COMMUNITY): Payer: Self-pay | Admitting: Oncology

## 2011-01-27 ENCOUNTER — Encounter (HOSPITAL_BASED_OUTPATIENT_CLINIC_OR_DEPARTMENT_OTHER): Payer: Medicare Other | Admitting: Oncology

## 2011-01-27 DIAGNOSIS — C8299 Follicular lymphoma, unspecified, extranodal and solid organ sites: Secondary | ICD-10-CM

## 2011-01-27 DIAGNOSIS — C8590 Non-Hodgkin lymphoma, unspecified, unspecified site: Secondary | ICD-10-CM

## 2011-03-24 ENCOUNTER — Encounter (HOSPITAL_BASED_OUTPATIENT_CLINIC_OR_DEPARTMENT_OTHER): Payer: Medicare Other | Admitting: Oncology

## 2011-03-24 DIAGNOSIS — Z452 Encounter for adjustment and management of vascular access device: Secondary | ICD-10-CM

## 2011-03-24 DIAGNOSIS — C8299 Follicular lymphoma, unspecified, extranodal and solid organ sites: Secondary | ICD-10-CM

## 2011-05-25 ENCOUNTER — Ambulatory Visit (HOSPITAL_BASED_OUTPATIENT_CLINIC_OR_DEPARTMENT_OTHER): Payer: Medicare Other

## 2011-05-25 ENCOUNTER — Telehealth: Payer: Self-pay | Admitting: Oncology

## 2011-05-25 DIAGNOSIS — C8590 Non-Hodgkin lymphoma, unspecified, unspecified site: Secondary | ICD-10-CM

## 2011-05-25 DIAGNOSIS — C8299 Follicular lymphoma, unspecified, extranodal and solid organ sites: Secondary | ICD-10-CM

## 2011-05-25 MED ORDER — SODIUM CHLORIDE 0.9 % IJ SOLN
10.0000 mL | INTRAMUSCULAR | Status: DC | PRN
  Administered 2011-05-25: 10 mL via INTRAVENOUS
  Filled 2011-05-25: qty 10

## 2011-05-25 MED ORDER — HEPARIN SOD (PORK) LOCK FLUSH 100 UNIT/ML IV SOLN
500.0000 [IU] | Freq: Once | INTRAVENOUS | Status: AC
  Administered 2011-05-25: 500 [IU] via INTRAVENOUS
  Filled 2011-05-25: qty 5

## 2011-05-25 NOTE — Telephone Encounter (Signed)
Pt came in today to check on 2013 appts. Pt was given schedule for 2/4 and 2/8. Pt has ct info.

## 2011-05-25 NOTE — Patient Instructions (Signed)
Call MD for problems 

## 2011-06-23 HISTORY — PX: OTHER SURGICAL HISTORY: SHX169

## 2011-07-22 ENCOUNTER — Telehealth: Payer: Self-pay | Admitting: Oncology

## 2011-07-22 NOTE — Telephone Encounter (Signed)
Moved 2/8 appt to 2/6. lmonvm for pt re change w/new d/t. Also confirmed appts for 2/4 lb/flush/ct. Added not to 2/4 appt to send pt for new schedule.

## 2011-07-27 ENCOUNTER — Other Ambulatory Visit: Payer: Medicare Other | Admitting: Lab

## 2011-07-27 ENCOUNTER — Ambulatory Visit (HOSPITAL_COMMUNITY)
Admission: RE | Admit: 2011-07-27 | Discharge: 2011-07-27 | Disposition: A | Discharge status: Home / Self Care | Payer: Medicare Other | Source: Ambulatory Visit | Attending: Oncology | Admitting: Oncology

## 2011-07-27 ENCOUNTER — Ambulatory Visit (HOSPITAL_BASED_OUTPATIENT_CLINIC_OR_DEPARTMENT_OTHER): Payer: Medicare Other

## 2011-07-27 DIAGNOSIS — C8590 Non-Hodgkin lymphoma, unspecified, unspecified site: Secondary | ICD-10-CM

## 2011-07-27 DIAGNOSIS — C8589 Other specified types of non-Hodgkin lymphoma, extranodal and solid organ sites: Secondary | ICD-10-CM | POA: Insufficient documentation

## 2011-07-27 DIAGNOSIS — Z452 Encounter for adjustment and management of vascular access device: Secondary | ICD-10-CM

## 2011-07-27 DIAGNOSIS — R599 Enlarged lymph nodes, unspecified: Secondary | ICD-10-CM | POA: Insufficient documentation

## 2011-07-27 LAB — CMP (CANCER CENTER ONLY)
ALT(SGPT): 26 U/L (ref 10–47)
AST: 18 U/L (ref 11–38)
Albumin: 3.8 g/dL (ref 3.3–5.5)
Alkaline Phosphatase: 41 U/L (ref 26–84)
BUN, Bld: 15 mg/dL (ref 7–22)
CO2: 30 mEq/L (ref 18–33)
Calcium: 8.9 mg/dL (ref 8.0–10.3)
Chloride: 100 mEq/L (ref 98–108)
Creat: 1 mg/dl (ref 0.6–1.2)
Glucose, Bld: 111 mg/dL (ref 73–118)
Potassium: 4.3 mEq/L (ref 3.3–4.7)
Sodium: 141 mEq/L (ref 128–145)
Total Bilirubin: 0.7 mg/dl (ref 0.20–1.60)
Total Protein: 7 g/dL (ref 6.4–8.1)

## 2011-07-27 LAB — CBC WITH DIFFERENTIAL/PLATELET
BASO%: 0.4 % (ref 0.0–2.0)
Basophils Absolute: 0 10*3/uL (ref 0.0–0.1)
EOS%: 2.7 % (ref 0.0–7.0)
Eosinophils Absolute: 0.2 10*3/uL (ref 0.0–0.5)
HCT: 44.9 % (ref 38.4–49.9)
HGB: 15.7 g/dL (ref 13.0–17.1)
LYMPH%: 25.9 % (ref 14.0–49.0)
MCH: 30.5 pg (ref 27.2–33.4)
MCHC: 34.9 g/dL (ref 32.0–36.0)
MCV: 87.5 fL (ref 79.3–98.0)
MONO#: 0.6 10*3/uL (ref 0.1–0.9)
MONO%: 10.2 % (ref 0.0–14.0)
NEUT#: 3.6 10*3/uL (ref 1.5–6.5)
NEUT%: 60.8 % (ref 39.0–75.0)
Platelets: 177 10*3/uL (ref 140–400)
RBC: 5.14 10*6/uL (ref 4.20–5.82)
RDW: 13.5 % (ref 11.0–14.6)
WBC: 6 10*3/uL (ref 4.0–10.3)
lymph#: 1.5 10*3/uL (ref 0.9–3.3)

## 2011-07-27 LAB — LACTATE DEHYDROGENASE: LDH: 141 U/L (ref 94–250)

## 2011-07-27 MED ORDER — SODIUM CHLORIDE 0.9 % IJ SOLN
10.0000 mL | INTRAMUSCULAR | Status: DC | PRN
  Administered 2011-07-27: 10 mL via INTRAVENOUS
  Filled 2011-07-27: qty 10

## 2011-07-27 MED ORDER — HEPARIN SOD (PORK) LOCK FLUSH 100 UNIT/ML IV SOLN
500.0000 [IU] | Freq: Once | INTRAVENOUS | Status: AC
  Administered 2011-07-27: 500 [IU] via INTRAVENOUS
  Filled 2011-07-27: qty 5

## 2011-07-27 MED ORDER — IOHEXOL 300 MG/ML  SOLN
100.0000 mL | Freq: Once | INTRAMUSCULAR | Status: AC | PRN
  Administered 2011-07-27: 100 mL via INTRAVENOUS

## 2011-07-29 ENCOUNTER — Ambulatory Visit (HOSPITAL_BASED_OUTPATIENT_CLINIC_OR_DEPARTMENT_OTHER): Payer: Medicare Other | Admitting: Oncology

## 2011-07-29 ENCOUNTER — Telehealth: Payer: Self-pay | Admitting: Oncology

## 2011-07-29 ENCOUNTER — Encounter: Payer: Self-pay | Admitting: Oncology

## 2011-07-29 VITALS — BP 144/82 | HR 60 | Temp 97.4°F | Ht 69.0 in | Wt 210.3 lb

## 2011-07-29 DIAGNOSIS — C8290 Follicular lymphoma, unspecified, unspecified site: Secondary | ICD-10-CM

## 2011-07-29 DIAGNOSIS — C8299 Follicular lymphoma, unspecified, extranodal and solid organ sites: Secondary | ICD-10-CM

## 2011-07-29 HISTORY — DX: Follicular lymphoma, unspecified, unspecified site: C82.90

## 2011-07-29 NOTE — Progress Notes (Signed)
CC:   Durenda Hurt, M.D. Newman Pies, MD Webb Silversmith, MD  PROBLEM LIST: 1. Non-Hodgkin's lymphoma diagnosed in May 2000.  This apparently was     a follicular grade 1, CD20 positive, non-Hodgkin's lymphoma with     involvement of the left part of the thyroid, chest, and abdomen.     The patient tells me that this was a diffuse large B-cell lymphoma,     although I believe our records do not indicate that.  In any event,     he was treated with CHOP chemotherapy for 6 cycles from August 2000     through November 2000.  He then received Rituxan for 8 cycles from     November 2000 through February of 2001 and had a complete     remission.  He had recurrent lymphoma by PET scan on 02/04/2007     with a confirmatory biopsy of the right axillary lymph node on     02/24/2007.  This showed follicular lymphoma grade 1-2, CD20     positive.  He had involvement of right axilla, retroperitoneum, and     pelvic lymph nodes.  He was having chest pain and night sweats.  He     was treated with Rituxan-CVP for 6 cycles with achievement of a     complete remission.  These treatments were given from September     2008 through January 2009.  The patient then received 1 dose of     maintenance Rituxan on 12/30/2007 after we had seen him but did not     want to continue with maintenance Rituxan.  The patient's first     visit here was on 12/19/2007. 2. Hypertension. 3. Dyslipidemia. 4. Right vocal cord nodules. 5. Status post left hemithyroidectomy yielding a 3.5 cm adenoma on     03/04/2009. 6. History of left-sided chest wall pain for the past 2 or 3 years. 7. History of vertigo. 8. History of squamous cell skin cancers involving the arms, face, and     neck secondary to sun exposure. 9. Right-sided Port-A-Cath placed in September 2008.  MEDICATIONS: 1. Aspirin 160 mg twice daily. 2. Calcium and magnesium 1 tablet twice daily. 3. Vitamin D 1,000 units daily. 4. Coenzyme Q. 5. Vasotec 20 mg  daily. 6. Vitamin B12 1,000 mcg daily. 7. Fish oil/omega-3 fatty acids 1,000 mg daily. 8. Glucosamine chondroitin 500/400 mg one tablet twice daily. 9. Multivitamins 1 tablet daily. 10.Pravachol 40 mg daily. 11.Saw palmetto 500 mg twice daily.  HISTORY:  Sean Hodges is now 71 years old and is here today with his wife, Lucendia Herrlich.  He was last seen by [name] on 01/27/2011.  It will be recalled that we first saw the patient for evaluation on 12/19/2007.  He had presented to [name] with records from his previous treatments in [place], [place]. The patient's clinical status remains stable and unchanged.  He does have some left-sided chest pain, which has not changed.  This has been present for a couple years.  The patient denies any symptoms to suggest active lymphoma; specifically, fever or night sweats.  In general, he feels well.  He underwent CT scans of chest, abdomen, and pelvis with IV contrast on 07/27/2011.  The scans were compared with the prior studies of 01/23/2011.  PHYSICAL EXAMINATION:  General Appearance:  Mr. Ciancio looks well.  No obvious changes.  Vital Signs:  Weight is 210 pounds.  Height 5 feet and 9 inches.  Body surface area 2.15  m2.  Blood pressure 144/82.  Other vital signs are normal.  HEENT:  There is no scleral icterus.  He had an injury with decreased vision in his left eye and also an irregular pupil, as previously noted.  Mouth and pharynx are benign.  No peripheral adenopathy palpable.  He may have rosacea.  Heart:  Normal. Lungs:  Normal.  Breath sounds are decreased.  Chest:  He has a right- sided Port-A-Cath that is being flushed with heparin every 2 months. Abdomen:  Benign and nontender with no organomegaly or masses palpable. Extremities:  No peripheral edema or clubbing.  Neurologic Exam: Grossly normal.  LABORATORY DATA:  From 07/27/2011, white count 6, ANC 3.6, hemoglobin 15.7, hematocrit 44.9, platelets 177,000.  Chemistries from 07/27/2011: Albumin 3.8, LDH  141.  Chemistries were normal.  IMAGING STUDIES: 1. CT scan of chest, abdomen, and pelvis with IV contrast from     02/04/2010 showed stability with no evidence for lymphoma     recurrence compared with the prior study of 08/07/2008. 2. CT scan of chest, abdomen, and pelvis from 01/23/2011 showed a 3 mm     right upper lobe nodule on image 15, which is unchanged.  There was     interval development of mild right pelvic lymphadenopathy     concerning for possible recurrent lymphoma.  Small retroperitoneal     and mesenteric lymph nodes within the abdomen were unchanged. 3. CT scan of chest, abdomen, and pelvis with IV contrast on     07/27/2011 showed no enlarged mediastinal, hilar, or axillary lymph     nodes.  There is a small right thyroid low density lesion on image     5, which is unchanged.  There is a tiny right upper lobe pulmonary     nodule on image 13, which is unchanged.  No new or enlarging     pulmonary nodules.  There are scattered mild enlarged abdominal and     pelvic lymph nodes, mostly unchanged compared with the most recent     examination carried out on 01/23/2011.  There is a single celiac     lymph node that is minimally larger, measuring 9 mm in the short     axis on image 56 whereas previously it measured 7 mm.  The     asymmetrically mildly enlarged right pelvic lymph nodes noted on     the most recent examination have not significantly changed.  An     external iliac lymph node on the right measures 13 mm in short axis     on image 108, unchanged from the prior study.  IMPRESSION AND PLAN:  Mr. Brogden continues to do well with no active lymphoma.  As discussed with him, I cannot exclude the possibility that some of these minimally enlarged lymph nodes certainly could contain lymphoma.  He was given a copy of his report.  There are no symptoms at this time to suggest active lymphoma.  As stated above, his lymphoma goes back to May 2000, i.e., approaching 13  years ago.  The patient was telling me that his original diagnosis showed diffuse large B-cell lymphoma.  I asked him to bring in that report for me.  I looked through [facility] and I was unable to find prior documentation.  The patient will continue to have his port flushed with heparin every 2 months. We will plan to see him again in 6 months at which time we will check a CBC,  chemistries and  LDH.  I have ordered CT scans of the chest, abdomen and pelvis with IV contrast for about August 1, a few days prior to his appointment with me.  ______________________________ Samul Dada, M.D. DSM/MEDQ  D:  07/29/2011  T:  07/29/2011  Job:  [id number]

## 2011-07-29 NOTE — Telephone Encounter (Signed)
appts made and printed for  2/4/6/8 2013 and contrast given  aom

## 2011-07-29 NOTE — Progress Notes (Signed)
This office note has been dictated.  #[id number]

## 2011-07-31 ENCOUNTER — Ambulatory Visit: Payer: Medicare Other | Admitting: Oncology

## 2011-09-22 ENCOUNTER — Ambulatory Visit (HOSPITAL_BASED_OUTPATIENT_CLINIC_OR_DEPARTMENT_OTHER): Payer: Medicare Other

## 2011-09-22 VITALS — BP 121/72 | HR 67

## 2011-09-22 DIAGNOSIS — C8589 Other specified types of non-Hodgkin lymphoma, extranodal and solid organ sites: Secondary | ICD-10-CM

## 2011-09-22 DIAGNOSIS — C8590 Non-Hodgkin lymphoma, unspecified, unspecified site: Secondary | ICD-10-CM

## 2011-09-22 DIAGNOSIS — Z469 Encounter for fitting and adjustment of unspecified device: Secondary | ICD-10-CM

## 2011-09-22 MED ORDER — SODIUM CHLORIDE 0.9 % IJ SOLN
10.0000 mL | INTRAMUSCULAR | Status: DC | PRN
  Administered 2011-09-22: 10 mL via INTRAVENOUS
  Filled 2011-09-22: qty 10

## 2011-09-22 MED ORDER — HEPARIN SOD (PORK) LOCK FLUSH 100 UNIT/ML IV SOLN
500.0000 [IU] | Freq: Once | INTRAVENOUS | Status: AC
  Administered 2011-09-22: 500 [IU] via INTRAVENOUS
  Filled 2011-09-22: qty 5

## 2011-11-24 ENCOUNTER — Ambulatory Visit (HOSPITAL_BASED_OUTPATIENT_CLINIC_OR_DEPARTMENT_OTHER): Payer: Medicare Other

## 2011-11-24 DIAGNOSIS — C8299 Follicular lymphoma, unspecified, extranodal and solid organ sites: Secondary | ICD-10-CM

## 2011-11-24 DIAGNOSIS — C8590 Non-Hodgkin lymphoma, unspecified, unspecified site: Secondary | ICD-10-CM

## 2011-11-24 MED ORDER — SODIUM CHLORIDE 0.9 % IJ SOLN
10.0000 mL | INTRAMUSCULAR | Status: DC | PRN
  Administered 2011-11-24: 10 mL via INTRAVENOUS
  Filled 2011-11-24: qty 10

## 2011-11-24 MED ORDER — HEPARIN SOD (PORK) LOCK FLUSH 100 UNIT/ML IV SOLN
500.0000 [IU] | Freq: Once | INTRAVENOUS | Status: AC
  Administered 2011-11-24: 500 [IU] via INTRAVENOUS
  Filled 2011-11-24: qty 5

## 2012-01-21 ENCOUNTER — Telehealth: Payer: Self-pay | Admitting: Oncology

## 2012-01-21 NOTE — Telephone Encounter (Signed)
Received call from [facility] pt on her line rescheduling 8/5 ct to 8/7 and would like lb/fu on 8/5 to be moved to 8/7 w/ct. Lb/flush moved to 8/7 @ 8:30 am and pet will have scan 8/7 @ 9:30 am. Changed communicated to pt via [place] as he was already on the line w/her.

## 2012-01-25 ENCOUNTER — Other Ambulatory Visit (HOSPITAL_BASED_OUTPATIENT_CLINIC_OR_DEPARTMENT_OTHER): Payer: Medicare Other | Admitting: Lab

## 2012-01-25 ENCOUNTER — Other Ambulatory Visit (HOSPITAL_COMMUNITY): Payer: Medicare Other

## 2012-01-27 ENCOUNTER — Ambulatory Visit (HOSPITAL_BASED_OUTPATIENT_CLINIC_OR_DEPARTMENT_OTHER): Payer: Medicare Other

## 2012-01-27 ENCOUNTER — Other Ambulatory Visit: Payer: Medicare Other

## 2012-01-27 ENCOUNTER — Other Ambulatory Visit (HOSPITAL_COMMUNITY): Payer: Medicare Other

## 2012-01-27 ENCOUNTER — Ambulatory Visit (HOSPITAL_COMMUNITY)
Admission: RE | Admit: 2012-01-27 | Discharge: 2012-01-27 | Disposition: A | Discharge status: Home / Self Care | Payer: Medicare Other | Source: Ambulatory Visit | Attending: Oncology | Admitting: Oncology

## 2012-01-27 ENCOUNTER — Telehealth: Payer: Self-pay | Admitting: Oncology

## 2012-01-27 VITALS — BP 146/85 | HR 60 | Temp 97.8°F

## 2012-01-27 DIAGNOSIS — N400 Enlarged prostate without lower urinary tract symptoms: Secondary | ICD-10-CM | POA: Insufficient documentation

## 2012-01-27 DIAGNOSIS — C8589 Other specified types of non-Hodgkin lymphoma, extranodal and solid organ sites: Secondary | ICD-10-CM | POA: Insufficient documentation

## 2012-01-27 DIAGNOSIS — Z9221 Personal history of antineoplastic chemotherapy: Secondary | ICD-10-CM | POA: Insufficient documentation

## 2012-01-27 DIAGNOSIS — C8590 Non-Hodgkin lymphoma, unspecified, unspecified site: Secondary | ICD-10-CM

## 2012-01-27 DIAGNOSIS — R911 Solitary pulmonary nodule: Secondary | ICD-10-CM | POA: Insufficient documentation

## 2012-01-27 DIAGNOSIS — C8299 Follicular lymphoma, unspecified, extranodal and solid organ sites: Secondary | ICD-10-CM

## 2012-01-27 DIAGNOSIS — C8290 Follicular lymphoma, unspecified, unspecified site: Secondary | ICD-10-CM

## 2012-01-27 DIAGNOSIS — R599 Enlarged lymph nodes, unspecified: Secondary | ICD-10-CM | POA: Insufficient documentation

## 2012-01-27 LAB — CMP (CANCER CENTER ONLY)
ALT(SGPT): 24 U/L (ref 10–47)
AST: 19 U/L (ref 11–38)
Albumin: 3.6 g/dL (ref 3.3–5.5)
Alkaline Phosphatase: 47 U/L (ref 26–84)
BUN, Bld: 19 mg/dL (ref 7–22)
CO2: 28 mEq/L (ref 18–33)
Calcium: 9 mg/dL (ref 8.0–10.3)
Chloride: 99 mEq/L (ref 98–108)
Creat: 1.1 mg/dl (ref 0.6–1.2)
Glucose, Bld: 104 mg/dL (ref 73–118)
Potassium: 4.5 mEq/L (ref 3.3–4.7)
Sodium: 138 mEq/L (ref 128–145)
Total Bilirubin: 0.7 mg/dl (ref 0.20–1.60)
Total Protein: 6.6 g/dL (ref 6.4–8.1)

## 2012-01-27 LAB — LACTATE DEHYDROGENASE: LDH: 127 U/L (ref 94–250)

## 2012-01-27 LAB — CBC WITH DIFFERENTIAL/PLATELET
BASO%: 0.9 % (ref 0.0–2.0)
Basophils Absolute: 0 10*3/uL (ref 0.0–0.1)
EOS%: 2.7 % (ref 0.0–7.0)
Eosinophils Absolute: 0.1 10*3/uL (ref 0.0–0.5)
HCT: 42.4 % (ref 38.4–49.9)
HGB: 14.5 g/dL (ref 13.0–17.1)
LYMPH%: 26.3 % (ref 14.0–49.0)
MCH: 30.2 pg (ref 27.2–33.4)
MCHC: 34.2 g/dL (ref 32.0–36.0)
MCV: 88.3 fL (ref 79.3–98.0)
MONO#: 0.7 10*3/uL (ref 0.1–0.9)
MONO%: 12.4 % (ref 0.0–14.0)
NEUT#: 3.2 10*3/uL (ref 1.5–6.5)
NEUT%: 57.7 % (ref 39.0–75.0)
Platelets: 170 10*3/uL (ref 140–400)
RBC: 4.8 10*6/uL (ref 4.20–5.82)
RDW: 14.3 % (ref 11.0–14.6)
WBC: 5.5 10*3/uL (ref 4.0–10.3)
lymph#: 1.5 10*3/uL (ref 0.9–3.3)

## 2012-01-27 MED ORDER — IOHEXOL 300 MG/ML  SOLN
100.0000 mL | Freq: Once | INTRAMUSCULAR | Status: AC | PRN
  Administered 2012-01-27: 100 mL via INTRAVENOUS

## 2012-01-27 MED ORDER — SODIUM CHLORIDE 0.9 % IJ SOLN
10.0000 mL | INTRAMUSCULAR | Status: DC | PRN
  Administered 2012-01-27: 10 mL via INTRAVENOUS
  Filled 2012-01-27: qty 10

## 2012-01-27 NOTE — Progress Notes (Signed)
50- Port accessed with power port HN for CT scan scheduled today.  Labs drawn and port flushed with normal saline only.  Pt has appt for CT today at 0930-dhp, rn

## 2012-01-27 NOTE — Telephone Encounter (Signed)
l/m with ner appt info   aom

## 2012-01-27 NOTE — Patient Instructions (Signed)
Call MD with any problems 

## 2012-01-29 ENCOUNTER — Ambulatory Visit: Payer: Medicare Other | Admitting: Family

## 2012-02-01 ENCOUNTER — Ambulatory Visit: Payer: Medicare Other | Admitting: Family

## 2012-02-01 ENCOUNTER — Telehealth: Payer: Self-pay | Admitting: Oncology

## 2012-02-01 NOTE — Telephone Encounter (Signed)
called and l/m with appt change   aom

## 2012-02-05 ENCOUNTER — Encounter: Payer: Self-pay | Admitting: Family

## 2012-02-05 ENCOUNTER — Ambulatory Visit (HOSPITAL_BASED_OUTPATIENT_CLINIC_OR_DEPARTMENT_OTHER): Payer: Medicare Other | Admitting: Family

## 2012-02-05 ENCOUNTER — Telehealth: Payer: Self-pay | Admitting: Oncology

## 2012-02-05 VITALS — BP 112/71 | HR 67 | Temp 97.0°F | Resp 18 | Ht 69.0 in | Wt 211.4 lb

## 2012-02-05 DIAGNOSIS — I10 Essential (primary) hypertension: Secondary | ICD-10-CM

## 2012-02-05 DIAGNOSIS — C8290 Follicular lymphoma, unspecified, unspecified site: Secondary | ICD-10-CM

## 2012-02-05 DIAGNOSIS — C8298 Follicular lymphoma, unspecified, lymph nodes of multiple sites: Secondary | ICD-10-CM

## 2012-02-05 NOTE — Patient Instructions (Addendum)
Patient ID: Sean Hodges,   DOB: 06/09/1941,  MRN: [id number]   [facility] Discharge Instructions  RECOMMENDATIONS MAD BY THE CONSULTANT AND ANY TEST RESULT(S) WILL BE FORWARDED TO YOU REFERRING DOCTOR   EXAM FINDINGS BY NURSE PRACTITIONER TODAY TO REPORT TO THE CLINIC OR PRIMARY PROVIDER: N/A   Your Current Medications Are: Current Outpatient Prescriptions  Medication Sig Dispense Refill  . aspirin 81 MG tablet Take 160 mg by mouth 2 (two) times daily.      . calcium & magnesium carbonates (MYLANTA) 311-232 MG per tablet Take 1 tablet by mouth 2 (two) times daily.      . cholecalciferol (VITAMIN D) 400 UNITS TABS Take 1,000 Units by mouth. 500 units daily      . COENZYME Q-10 PO Take by mouth.      . cyanocobalamin 1000 MCG tablet Take 100 mcg by mouth daily.      . fish oil-omega-3 fatty acids 1000 MG capsule Take 1 g by mouth daily.      Marland Kitchen glucosamine-chondroitin 500-400 MG tablet Take 1 tablet by mouth 2 (two) times daily.      . pravastatin (PRAVACHOL) 40 MG tablet Take 40 mg by mouth daily.      . ranitidine (ZANTAC) 150 MG capsule Take 150 mg by mouth every evening.      . saw palmetto 500 MG capsule Take 500 mg by mouth 2 (two) times daily.         INSTRUCTIONS GIVEN, DISCUSSED AND FOLLOW-UP: Follow up with you Dermatologist about the troublesome areas on you back and arms.  I acknowledge that I have been informed and understand all the instructions given to me and have received a copy.  I do not have any further questions at this time, but I understand that I may call the [facility] at [phone] during business hours should I have any further questions or need assistance in obtaining follow-up care.   02/05/2012, 4:54 PM

## 2012-02-05 NOTE — Progress Notes (Signed)
Patient ID: Sean Hodges, male   DOB: 10-Nov-1940, 71 y.o.   MRN: [id number] CSN: [id number]  CC: Durenda Hurt, M.D.  Illene Silver, MD  Webb Silversmith, MD Fran Lowes., MD  Problem Hodges: Sean Hodges is a 71 y.o. Caucasian male with a problem Hodges consisting of:  1. Non-Hodgkin's lymphoma diagnosed in May 2000. This apparently was a follicular grade 1, CD20 positive, non-Hodgkin's lymphoma with involvement of the left part of the thyroid, chest, and abdomen. The patient tells me that this was a diffuse large B-cell lymphoma, although I believe our records do not indicate that. In any event, he was treated with CHOP chemotherapy for 6 cycles from August 2000 through November 2000. He then received Rituxan for 8 cycles from November 2000 through February of 2001 and had a complete remission. He had recurrent lymphoma by PET scan on 02/04/2007 with a confirmatory biopsy of the right axillary lymph node on 02/24/2007. This showed follicular lymphoma grade 1-2, CD20 positive. He had involvement of right axilla, retroperitoneum, and pelvic lymph nodes. He was having chest pain and night sweats. He was treated with Rituxan-CVP for 6 cycles with achievement of a complete remission. These treatments were given from September 2008 through January 2009. The patient then received 1 dose of maintenance Rituxan on 12/30/2007 after we had seen him but did not want to continue with maintenance Rituxan. The patient's first visit here was on 12/19/2007.  2. Hypertension.  3. Dyslipidemia.  4. Right vocal cord nodules.  5. Status post left hemithyroidectomy yielding a 3.5 cm adenoma on 03/04/2009.  6. History of left-sided chest wall pain for the past 2 or 3 years.  7. History of vertigo.  8. History of squamous cell skin cancers involving the arms, face, and neck secondary to sun exposure.  9. Right-sided Port-A-Cath placed in September 2008.  Mr. [name] was examined by Dr. Arline Asp and I today.   Sean Hodges is 71 years old and is here today with his wife, Lucendia Herrlich. He was last seen by [name] on 01/27/2011. It will be recalled that we first saw the patient for evaluation on 12/19/2007. He had presented to [name] with records from his previous treatments in [place], [place]. The patient's clinical status remains stable and unchanged. In the past the patient has complained of left-sided chest pain, but is without complaint of chest pain today.  The patient denies any symptoms to suggest active lymphoma; specifically, fever or night sweats. In general, he feels well but did have a some areas on his skin that are pruritic and of concern (left forearm and right lower back).  The patient states that he has a office visit with his Dermatologist, Dr. Nita Sells scheduled for next week.  He underwent CT scans of chest, abdomen, and pelvis with IV contrast on 01/27/2012. The scans were compared with the prior studies of 07/27/2011.  The scans are unremarkable per Dr. Arline Asp. Since we last saw the patient, he underwent cataract removal and repair of an old eye injury surgery on his left eye 12/16/2011.  The patient denies any other concerns or symptomatology during today's visit.   Past Medical History: Past Medical History  Diagnosis Date  . Cancer     nhl    Surgical History: Past Surgical History  Procedure Date  . Left eye surgery 2013    Current Medications: Current Outpatient Prescriptions  Medication Sig Dispense Refill  . aspirin 81 MG tablet Take 160 mg by mouth 2 (  two) times daily.      . calcium & magnesium carbonates (MYLANTA) 311-232 MG per tablet Take 1 tablet by mouth 2 (two) times daily.      . cholecalciferol (VITAMIN D) 400 UNITS TABS Take 1,000 Units by mouth. 500 units daily      . COENZYME Q-10 PO Take by mouth.      . cyanocobalamin 1000 MCG tablet Take 100 mcg by mouth daily.      . fish oil-omega-3 fatty acids 1000 MG capsule Take 1 g by mouth daily.      Marland Kitchen glucosamine-chondroitin  500-400 MG tablet Take 1 tablet by mouth 2 (two) times daily.      . pravastatin (PRAVACHOL) 40 MG tablet Take 40 mg by mouth daily.      . ranitidine (ZANTAC) 150 MG capsule Take 150 mg by mouth every evening.      . saw palmetto 500 MG capsule Take 500 mg by mouth 2 (two) times daily.        Allergies: No Known Allergies   Family History: No family history on file.  Social History: History  Substance Use Topics  . Smoking status: Not on file  . Smokeless tobacco: Not on file  . Alcohol Use: Not on file    Review of Systems: 10 Point review of systems was completed and is negative except as noted above.   Physical Exam:   Blood pressure 112/71, pulse 67, temperature 97 F (36.1 C), temperature source Oral, resp. rate 18, height 5\' 9"  (1.753 m), weight 211 lb 6.4 oz (95.89 kg).  General appearance: Alert, cooperative, well nourished, no apparent distress Head: Normocephalic, without obvious abnormality, atraumatic Eyes: Conjunctivae clear, cloudy left cornea, PERRLA, EOMI, no icterus  Nose: Nares, septum and mucosa are normal, no drainage or sinus tenderness Throat: Lips, mucosa, tongue, teeth and gums are normal Resp: Clear to auscultation bilaterally Cardio: Regular rate and rhythm, S1, S2 normal, no murmur, click, rub or gallop GI: Soft, non-tender, distended,  bowel sounds normal, no organomegaly Extremities: Extremities normal, atraumatic, no cyanosis or edema Skin: Dry skin with several areas of seborrheic keratosis, lesions on right lower back and bilateral forearms (L arm > R arm) Lymph nodes: Cervical, supraclavicular, and axillary nodes normal Neurologic: Grossly normal   Laboratory Data: Sodium 138, potassium 4.5, chloride 99, CO2 28, glucose 104, creatinine 1.1, calcium 9.0, BUN 19, alkaline phosphatase 47, albumin 3.6, AST 19, ALT 24, total protein 6.6, total bilirubin 0.70, LDH 127, WBCs 5.5, RBCs 4.80, hemoglobin 14.5, hematocrit 42.4, MCV 88.3, MCHC 3.2,  MCHC 34.2, RDW 14.3, platelets 170, ANC 3.2  Imaging Studies: 1. CT scan of chest, abdomen, and pelvis with IV contrast from 02/04/2010 showed stability with no evidence for lymphoma recurrence compared with the prior study of 08/07/2008.  2. CT scan of chest, abdomen, and pelvis from 01/23/2011 showed a 3 mm right upper lobe nodule on image 15, which is unchanged. There was interval development of mild right pelvic lymphadenopathy concerning for possible recurrent lymphoma. Small retroperitoneal and mesenteric lymph nodes within the abdomen were unchanged.  3. CT scan of chest, abdomen, and pelvis with IV contrast on 07/27/2011 showed no enlarged mediastinal, hilar, or axillary lymph  nodes. There is a small right thyroid low density lesion on image 5, which is unchanged. There is a tiny right upper lobe pulmonary nodule on image 13, which is unchanged. No new or enlarging pulmonary nodules. There are scattered mild enlarged abdominal and pelvic lymph nodes, mostly  unchanged compared with the most recent examination carried out on 01/23/2011. There is a single celiac lymph node that is minimally larger, measuring 9 mm in the short axis on image 56 whereas previously it measured 7 mm. The asymmetrically mildly enlarged right pelvic lymph nodes noted on the most recent examination have not significantly changed. An external iliac lymph node on the right measures 13 mm in short axis on image 108, unchanged from the prior study. 4.  Ct Chest W Contrast 01/27/2012 No axillary or supraclavicular lymphadenopathy. There is a port in the right anterior chest wall.  Left hemithyroidectomy noted.  No pathologically enlarged mediastinal lymph nodes or hilar nodes.  No pericardial fluid.  Esophagus is normal.  Small 3 mm right upper lobe nodule is unchanged (image 14).  IMPRESSION: No evidence of lymphoma recurrence within the thorax.  5.  Ct Abdomen Pelvis W Contrast  There are mildly enlarged left periaortic lymph nodes  which measures slightly larger than comparison exam.    For example, left periaortic node measuring 10 mm ( image 77) compared to 8 mm on prior.  More inferiorly, at the level of bifurcation, a 9mm lymph node (image 88) compares to 8 mm on prior. On the coronal projection visually these lymph nodes appear slightly more full than comparison exam (Image 51, series 602).  Right iliac lymph nodes are stable with a 13 mm right external iliac lymph node (image 101) unchanged 13 mm on prior.  No focal hepatic lesion. The gallbladder, pancreas, spleen, adrenal glands, and kidneys are stable.  There is a simple cyst within the right kidney.  The stomach, small bowel, appendix, and colon are normal.  Abdominal aorta normal caliber.  No free fluid the pelvis.  The prostate gland is mildly enlarged. There is mild thickening of the bladder wall to 5 mm which is similar to prior exam.  No aggressive osseous lesions.  IMPRESSION:  1. Mild increase in size of left periaortic lymph nodes.  This is a subtle finding but could indicate early progression of disease. 2.  Stable right iliac adenopathy and periportal adenopathy. 3.  Normal spleen     Impression/Plan: Mr. Thang continues to do well with no active lymphoma. As discussed with him, we cannot exclude the possibility that  some of these minimally enlarged lymph nodes certainly could contain lymphoma. He was given a copy of his reports. There are no symptoms at this time to suggest active lymphoma. As stated above, his lymphoma goes back to May 2000, i.e., over 13 years ago. The patient was telling me that his original diagnosis showed diffuse large B-cell lymphoma. I asked him to bring in that report for me. I looked through [facility] and I was unable to find prior documentation. The patient will continue to have his port flushed with heparin every 2 months. We will plan to see him again in 6 months at which time we will check a CBC, chemistries and LDH.  We will decide at  that time if further CT scans are warranted.  Dequann Vandervelden  NP-C 02/05/2012, 4:31 PM

## 2012-02-05 NOTE — Telephone Encounter (Signed)
Gave pt appt for October, December and February 2014 lab and MD

## 2012-03-25 ENCOUNTER — Other Ambulatory Visit: Payer: Medicare Other | Admitting: Lab

## 2012-03-25 ENCOUNTER — Ambulatory Visit (HOSPITAL_BASED_OUTPATIENT_CLINIC_OR_DEPARTMENT_OTHER): Payer: Medicare Other

## 2012-03-25 VITALS — BP 112/69 | HR 76 | Temp 97.1°F | Resp 20

## 2012-03-25 DIAGNOSIS — C8590 Non-Hodgkin lymphoma, unspecified, unspecified site: Secondary | ICD-10-CM

## 2012-03-25 DIAGNOSIS — Z452 Encounter for adjustment and management of vascular access device: Secondary | ICD-10-CM

## 2012-03-25 DIAGNOSIS — C8298 Follicular lymphoma, unspecified, lymph nodes of multiple sites: Secondary | ICD-10-CM

## 2012-03-25 MED ORDER — SODIUM CHLORIDE 0.9 % IJ SOLN
10.0000 mL | INTRAMUSCULAR | Status: DC | PRN
  Administered 2012-03-25: 10 mL via INTRAVENOUS
  Filled 2012-03-25: qty 10

## 2012-03-25 MED ORDER — HEPARIN SOD (PORK) LOCK FLUSH 100 UNIT/ML IV SOLN
500.0000 [IU] | Freq: Once | INTRAVENOUS | Status: AC
  Administered 2012-03-25: 500 [IU] via INTRAVENOUS
  Filled 2012-03-25: qty 5

## 2012-05-25 ENCOUNTER — Other Ambulatory Visit: Payer: Medicare Other | Admitting: Lab

## 2012-05-27 ENCOUNTER — Other Ambulatory Visit: Payer: Medicare Other | Admitting: Lab

## 2012-05-27 ENCOUNTER — Ambulatory Visit (HOSPITAL_BASED_OUTPATIENT_CLINIC_OR_DEPARTMENT_OTHER): Payer: Medicare Other

## 2012-05-27 VITALS — BP 124/69 | HR 85 | Temp 98.1°F

## 2012-05-27 DIAGNOSIS — Z452 Encounter for adjustment and management of vascular access device: Secondary | ICD-10-CM

## 2012-05-27 DIAGNOSIS — C8298 Follicular lymphoma, unspecified, lymph nodes of multiple sites: Secondary | ICD-10-CM

## 2012-05-27 DIAGNOSIS — C8590 Non-Hodgkin lymphoma, unspecified, unspecified site: Secondary | ICD-10-CM

## 2012-05-27 MED ORDER — HEPARIN SOD (PORK) LOCK FLUSH 100 UNIT/ML IV SOLN
500.0000 [IU] | Freq: Once | INTRAVENOUS | Status: AC
  Administered 2012-05-27: 500 [IU] via INTRAVENOUS
  Filled 2012-05-27: qty 5

## 2012-05-27 MED ORDER — SODIUM CHLORIDE 0.9 % IJ SOLN
10.0000 mL | INTRAMUSCULAR | Status: DC | PRN
  Administered 2012-05-27: 10 mL via INTRAVENOUS
  Filled 2012-05-27: qty 10

## 2012-05-27 NOTE — Patient Instructions (Signed)
Call MD for problems 

## 2012-07-13 ENCOUNTER — Telehealth: Payer: Self-pay | Admitting: Oncology

## 2012-07-13 NOTE — Telephone Encounter (Signed)
2/14 appt moved to 2/13 poof. lmonvm for pt and mailed schedule.

## 2012-08-01 ENCOUNTER — Telehealth: Payer: Self-pay | Admitting: Oncology

## 2012-08-01 NOTE — Telephone Encounter (Signed)
Pt called today and r/s appts. Next available not until April. Pt will do flush only. 2/14 and lb/DM r/s to 4/4 and an additional flush appt has been added. Pt has new appt d/t's.

## 2012-08-04 ENCOUNTER — Other Ambulatory Visit: Payer: Medicare Other | Admitting: Lab

## 2012-08-04 ENCOUNTER — Ambulatory Visit: Payer: Medicare Other | Admitting: Oncology

## 2012-08-05 ENCOUNTER — Ambulatory Visit: Payer: Medicare Other | Admitting: Oncology

## 2012-08-05 ENCOUNTER — Other Ambulatory Visit: Payer: Medicare Other | Admitting: Lab

## 2012-08-09 ENCOUNTER — Ambulatory Visit (HOSPITAL_BASED_OUTPATIENT_CLINIC_OR_DEPARTMENT_OTHER): Payer: Medicare Other

## 2012-08-09 VITALS — BP 126/73 | HR 67 | Temp 98.1°F

## 2012-08-09 DIAGNOSIS — C8589 Other specified types of non-Hodgkin lymphoma, extranodal and solid organ sites: Secondary | ICD-10-CM

## 2012-08-09 DIAGNOSIS — Z452 Encounter for adjustment and management of vascular access device: Secondary | ICD-10-CM

## 2012-08-09 DIAGNOSIS — C8590 Non-Hodgkin lymphoma, unspecified, unspecified site: Secondary | ICD-10-CM

## 2012-08-09 MED ORDER — SODIUM CHLORIDE 0.9 % IJ SOLN
10.0000 mL | INTRAMUSCULAR | Status: DC | PRN
  Administered 2012-08-09: 10 mL via INTRAVENOUS
  Filled 2012-08-09: qty 10

## 2012-08-09 MED ORDER — HEPARIN SOD (PORK) LOCK FLUSH 100 UNIT/ML IV SOLN
500.0000 [IU] | Freq: Once | INTRAVENOUS | Status: AC
  Administered 2012-08-09: 500 [IU] via INTRAVENOUS
  Filled 2012-08-09: qty 5

## 2012-09-23 ENCOUNTER — Ambulatory Visit (HOSPITAL_BASED_OUTPATIENT_CLINIC_OR_DEPARTMENT_OTHER): Payer: Medicare Other | Admitting: Oncology

## 2012-09-23 ENCOUNTER — Other Ambulatory Visit (HOSPITAL_BASED_OUTPATIENT_CLINIC_OR_DEPARTMENT_OTHER): Payer: Medicare Other | Admitting: Lab

## 2012-09-23 ENCOUNTER — Ambulatory Visit: Payer: Medicare Other

## 2012-09-23 ENCOUNTER — Telehealth: Payer: Self-pay | Admitting: Oncology

## 2012-09-23 ENCOUNTER — Encounter: Payer: Self-pay | Admitting: Oncology

## 2012-09-23 VITALS — BP 135/70 | HR 66 | Temp 98.8°F | Resp 20 | Ht 69.0 in | Wt 201.9 lb

## 2012-09-23 DIAGNOSIS — C8298 Follicular lymphoma, unspecified, lymph nodes of multiple sites: Secondary | ICD-10-CM

## 2012-09-23 DIAGNOSIS — C8589 Other specified types of non-Hodgkin lymphoma, extranodal and solid organ sites: Secondary | ICD-10-CM

## 2012-09-23 DIAGNOSIS — C8290 Follicular lymphoma, unspecified, unspecified site: Secondary | ICD-10-CM

## 2012-09-23 LAB — CBC WITH DIFFERENTIAL/PLATELET
BASO%: 1 % (ref 0.0–2.0)
Basophils Absolute: 0.1 10*3/uL (ref 0.0–0.1)
EOS%: 2.4 % (ref 0.0–7.0)
Eosinophils Absolute: 0.1 10*3/uL (ref 0.0–0.5)
HCT: 43 % (ref 38.4–49.9)
HGB: 14.7 g/dL (ref 13.0–17.1)
LYMPH%: 21.1 % (ref 14.0–49.0)
MCH: 29.2 pg (ref 27.2–33.4)
MCHC: 34.1 g/dL (ref 32.0–36.0)
MCV: 85.5 fL (ref 79.3–98.0)
MONO#: 0.7 10*3/uL (ref 0.1–0.9)
MONO%: 10.4 % (ref 0.0–14.0)
NEUT#: 4.1 10*3/uL (ref 1.5–6.5)
NEUT%: 65.1 % (ref 39.0–75.0)
Platelets: 173 10*3/uL (ref 140–400)
RBC: 5.03 10*6/uL (ref 4.20–5.82)
RDW: 13.7 % (ref 11.0–14.6)
WBC: 6.4 10*3/uL (ref 4.0–10.3)
lymph#: 1.3 10*3/uL (ref 0.9–3.3)

## 2012-09-23 LAB — COMPREHENSIVE METABOLIC PANEL (CC13)
ALT: 13 U/L (ref 0–55)
AST: 9 U/L (ref 5–34)
Albumin: 3.9 g/dL (ref 3.5–5.0)
Alkaline Phosphatase: 45 U/L (ref 40–150)
BUN: 23 mg/dL (ref 7.0–26.0)
CO2: 27 mEq/L (ref 22–29)
Calcium: 9.2 mg/dL (ref 8.4–10.4)
Chloride: 104 mEq/L (ref 98–107)
Creatinine: 1.2 mg/dL (ref 0.7–1.3)
Glucose: 95 mg/dl (ref 70–99)
Potassium: 4.5 mEq/L (ref 3.5–5.1)
Sodium: 139 mEq/L (ref 136–145)
Total Bilirubin: 0.58 mg/dL (ref 0.20–1.20)
Total Protein: 6.7 g/dL (ref 6.4–8.3)

## 2012-09-23 LAB — LACTATE DEHYDROGENASE (CC13): LDH: 138 U/L (ref 125–245)

## 2012-09-23 MED ORDER — HEPARIN SOD (PORK) LOCK FLUSH 100 UNIT/ML IV SOLN
500.0000 [IU] | Freq: Once | INTRAVENOUS | Status: AC
  Administered 2012-09-23: 500 [IU] via INTRAVENOUS
  Filled 2012-09-23: qty 5

## 2012-09-23 MED ORDER — SODIUM CHLORIDE 0.9 % IJ SOLN
10.0000 mL | INTRAMUSCULAR | Status: DC | PRN
  Administered 2012-09-23: 10 mL via INTRAVENOUS
  Filled 2012-09-23: qty 10

## 2012-09-23 NOTE — Telephone Encounter (Signed)
gv and printed appt schedule for pt for June, aug, and [place]

## 2012-09-23 NOTE — Progress Notes (Signed)
This office note has been dictated.  #[id number]

## 2012-09-23 NOTE — Progress Notes (Signed)
CC:   Durenda Hurt, M.D. Newman Pies, MD Webb Silversmith, MD  PROBLEM LIST:  1. Non-Hodgkin's lymphoma diagnosed in May 2000. This apparently was  a follicular grade 1, CD20 positive, non-Hodgkin's lymphoma with  involvement of the left part of the thyroid, chest, and abdomen.  The patient tells me that this was a diffuse large B-cell lymphoma,  although I believe our records do not indicate that. In any event,  he was treated with CHOP chemotherapy for 6 cycles from August 2000  through November 2000. He then received Rituxan for 8 cycles from  November 2000 through February of 2001 and had a complete  remission. He had recurrent lymphoma by PET scan on 02/04/2007  with a confirmatory biopsy of the right axillary lymph node on  02/24/2007. This showed follicular lymphoma grade 1-2, CD20  positive. He had involvement of right axilla, retroperitoneum, and  pelvic lymph nodes. He was having chest pain and night sweats. He  was treated with Rituxan-CVP for 6 cycles with achievement of a  complete remission. These treatments were given from September  2008 through January 2009. The patient then received 1 dose of  maintenance Rituxan on 12/30/2007 after we had seen him but did not  want to continue with maintenance Rituxan. The patient's first  visit here was on 12/19/2007.  2. Hypertension.  3. Dyslipidemia.  4. Right vocal cord nodules.  5. Status post left hemithyroidectomy yielding a 3.5 cm adenoma on  03/04/2009.  6. History of left-sided chest wall pain for the past 2 or 3 years.  7. History of vertigo.  8. History of squamous cell skin cancers involving the arms, face, and  neck secondary to sun exposure.  9. Right-sided Port-A-Cath placed in September 2008.   MEDICATIONS:  Reviewed and recorded. Current Outpatient Prescriptions  Medication Sig Dispense Refill  . calcium & magnesium carbonates (MYLANTA) 311-232 MG per tablet Take 1 tablet by mouth 2 (two) times daily.      .  cholecalciferol (VITAMIN D) 400 UNITS TABS Take 1,000 Units by mouth. 500 units daily      . COENZYME Q-10 PO Take by mouth.      . cyanocobalamin 1000 MCG tablet Take 100 mcg by mouth daily.      . enalapril (VASOTEC) 20 MG tablet Take 40 mg by mouth daily.      . fish oil-omega-3 fatty acids 1000 MG capsule Take 1 g by mouth daily.      Marland Kitchen glucosamine-chondroitin 500-400 MG tablet Take 1 tablet by mouth 2 (two) times daily.      . pravastatin (PRAVACHOL) 40 MG tablet Take 40 mg by mouth daily.      . ranitidine (ZANTAC) 150 MG capsule Take 150 mg by mouth every evening.      . saw palmetto 500 MG capsule Take 500 mg by mouth 2 (two) times daily.       No current facility-administered medications for this visit.    SMOKING HISTORY:  To be determined.   HISTORY:  I saw Sean Hodges today for a followup of his previous history of non-Hodgkin's lymphoma diagnosed in May 2000 with recurrence noted in the right axillary lymph nodes, documented by biopsy on 02/24/2007.  This was a follicular lymphoma grade 1-2.  The patient had lymph node disease above and below the diaphragm.  He has been in remission since completing 6 cycles of Rituxan-CVP.  He has had no treatments since January 2009.  Thus, he has been  disease-free for over 5 years.  The patient was last seen by [name] on 02/05/2012 and prior to that on 07/29/2011.  He is here today with his wife, Sean Hodges.  He underwent CT scans of chest, abdomen, and pelvis on 01/27/2012.  There was no evidence of disease in the chest.  He had minimal increase in the size of some left periaortic lymph nodes.  The largest lymph nodes apparently are in the pelvis and measure 13 mm.  These are located on the right side.  The patient is without any complaints today.  He continues to have his Port-A-Cath flushed every 2 months.  He lives in [place] and comes to [place] for this.  He denies any fever, chills, night sweats, any sense of ill health.  He  has been trying to lose weight and has been successful.  PHYSICAL EXAMINATION:  Mr. Frett looks well.  Weight is 201 pounds 14.4 ounces.  Height 5 feet 9 inches, body surface area 2.11 meters squared. Blood pressure 135/70.  Other vital signs are normal.  There is no scleral icterus.  Mouth and pharynx are benign.  No peripheral adenopathy palpable in any of the lymph node groups.  He has a right- sided Port-A-Cath that was flushed with heparin today.  We are flushing this with heparin every 2 months.  Breath sounds are decreased. Abdomen:  Benign with no organomegaly or masses palpable.  Extremities: No peripheral edema or clubbing.  No petechiae or purpura.  Neurologic: Normal.  The patient pointed out to me what seemed like a sebaceous cyst in his low back near the midline, just to the right of the midline. Thyroid was normal without masses.  LABORATORY DATA:  Today, white count 6.4, ANC 4.1, hemoglobin 14.7, hematocrit 43.0, platelets 173,000.  Chemistries were normal.  LDH is pending.  LDH from 01/27/2012 was 127.  IMAGING STUDIES:  1. CT scan of chest, abdomen, and pelvis with IV contrast from  02/04/2010 showed stability with no evidence for lymphoma  recurrence compared with the prior study of 08/07/2008.  2. CT scan of chest, abdomen, and pelvis from 01/23/2011 showed a 3 mm  right upper lobe nodule on image 15, which is unchanged. There was  interval development of mild right pelvic lymphadenopathy  concerning for possible recurrent lymphoma. Small retroperitoneal  and mesenteric lymph nodes within the abdomen were unchanged.  3. CT scan of chest, abdomen, and pelvis with IV contrast on  07/27/2011 showed no enlarged mediastinal, hilar, or axillary lymph  nodes. There is a small right thyroid low density lesion on image  5, which is unchanged. There is a tiny right upper lobe pulmonary  nodule on image 13, which is unchanged. No new or enlarging  pulmonary nodules. There  are scattered mild enlarged abdominal and  pelvic lymph nodes, mostly unchanged compared with the most recent  examination carried out on 01/23/2011. There is a single celiac  lymph node that is minimally larger, measuring 9 mm in the short  axis on image 56 whereas previously it measured 7 mm. The  asymmetrically mildly enlarged right pelvic lymph nodes noted on  the most recent examination have not significantly changed. An  external iliac lymph node on the right measures 13 mm in short axis  on image 108, unchanged from the prior study.  4. CT scan of the chest, abdomen, and pelvis with IV contrast on 01/27/2012 showed no evidence of lymphoma recurrence within the thorax.  There is felt to be mild increase  in the size of left periaortic lymph nodes which measured 10 mm on image 77, as compared with 8 mm on the prior CT scan from 07/27/2011.  Right iliac lymph nodes were stable, measuring 13 mm, and a right external iliac lymph node also was unchanged, also measuring 13 mm.  Spleen was normal in size.  IMPRESSION/PLAN:  Mr. Whalin continues to do well with no evidence for progression of his lymphoma.  As stated, he is now out over 5 years from the time of his last treatment.  We talked about removing the Port-A- Cath.  The patient has been kept driving up to [facility] from [place] to have the Port-A-Cath flushed with heparin every 2 months.  He will continue to do that at his preference.  The patient, at least for now, would like [name] to obtain CT scans on a yearly basis.  We had been obtaining CT scans every 6 months.  We will plan to get a CT scan of the abdomen and pelvis and also a chest x-ray in late September.  We will plan to see the patient again in 6 months, which should be early October, at which time we will check CBC and chemistries.    ______________________________ Samul Dada, M.D. DSM/MEDQ  D:  09/23/2012  T:  09/23/2012  Job:  [id number]

## 2012-09-26 ENCOUNTER — Telehealth: Payer: Self-pay | Admitting: *Deleted

## 2012-09-26 NOTE — Telephone Encounter (Signed)
Pt called to get an earlier appt for lab so that he could get a flush the same day. He cancel his flush for Oct. 3 and scheduled for 9.26.14.

## 2012-11-23 ENCOUNTER — Ambulatory Visit (HOSPITAL_BASED_OUTPATIENT_CLINIC_OR_DEPARTMENT_OTHER): Payer: Medicare Other

## 2012-11-23 VITALS — BP 116/70 | HR 76 | Temp 97.3°F | Resp 20

## 2012-11-23 DIAGNOSIS — C8590 Non-Hodgkin lymphoma, unspecified, unspecified site: Secondary | ICD-10-CM

## 2012-11-23 DIAGNOSIS — C8589 Other specified types of non-Hodgkin lymphoma, extranodal and solid organ sites: Secondary | ICD-10-CM

## 2012-11-23 DIAGNOSIS — Z452 Encounter for adjustment and management of vascular access device: Secondary | ICD-10-CM

## 2012-11-23 MED ORDER — SODIUM CHLORIDE 0.9 % IJ SOLN
10.0000 mL | INTRAMUSCULAR | Status: DC | PRN
  Administered 2012-11-23: 10 mL via INTRAVENOUS
  Filled 2012-11-23: qty 10

## 2012-11-23 MED ORDER — HEPARIN SOD (PORK) LOCK FLUSH 100 UNIT/ML IV SOLN
500.0000 [IU] | Freq: Once | INTRAVENOUS | Status: AC
  Administered 2012-11-23: 500 [IU] via INTRAVENOUS
  Filled 2012-11-23: qty 5

## 2012-11-23 NOTE — Patient Instructions (Signed)
Call MD for problems or concerns 

## 2013-01-23 ENCOUNTER — Ambulatory Visit (HOSPITAL_BASED_OUTPATIENT_CLINIC_OR_DEPARTMENT_OTHER): Payer: Medicare Other

## 2013-01-23 VITALS — BP 120/75 | HR 62 | Temp 97.4°F

## 2013-01-23 DIAGNOSIS — C8290 Follicular lymphoma, unspecified, unspecified site: Secondary | ICD-10-CM

## 2013-01-23 DIAGNOSIS — C8589 Other specified types of non-Hodgkin lymphoma, extranodal and solid organ sites: Secondary | ICD-10-CM

## 2013-01-23 DIAGNOSIS — Z452 Encounter for adjustment and management of vascular access device: Secondary | ICD-10-CM

## 2013-01-23 MED ORDER — HEPARIN SOD (PORK) LOCK FLUSH 100 UNIT/ML IV SOLN
500.0000 [IU] | Freq: Once | INTRAVENOUS | Status: AC
  Administered 2013-01-23: 500 [IU] via INTRAVENOUS
  Filled 2013-01-23: qty 5

## 2013-01-23 MED ORDER — SODIUM CHLORIDE 0.9 % IJ SOLN
10.0000 mL | INTRAMUSCULAR | Status: DC | PRN
  Administered 2013-01-23: 10 mL via INTRAVENOUS
  Filled 2013-01-23: qty 10

## 2013-01-23 NOTE — Patient Instructions (Addendum)
Implanted Port Instructions  An implanted port is a central line that has a round shape and is placed under the skin. It is used for long-term IV (intravenous) access for:  · Medicine.  · Fluids.  · Liquid nutrition, such as TPN (total parenteral nutrition).  · Blood samples.  Ports can be placed:  · In the chest area just below the collarbone (this is the most common place.)  · In the arms.  · In the belly (abdomen) area.  · In the legs.  PARTS OF THE PORT  A port has 2 main parts:  · The reservoir. The reservoir is round, disc-shaped, and will be a small, raised area under your skin.  · The reservoir is the part where a needle is inserted (accessed) to either give medicines or to draw blood.  · The catheter. The catheter is a long, slender tube that extends from the reservoir. The catheter is placed into a large vein.  · Medicine that is inserted into the reservoir goes into the catheter and then into the vein.  INSERTION OF THE PORT  · The port is surgically placed in either an operating room or in a procedural area (interventional radiology).  · Medicine may be given to help you relax during the procedure.  · The skin where the port will be inserted is numbed (local anesthetic).  · 1 or 2 small cuts (incisions) will be made in the skin to insert the port.  · The port can be used after it has been inserted.  INCISION SITE CARE  · The incision site may have small adhesive strips on it. This helps keep the incision site closed. Sometimes, no adhesive strips are placed. Instead of adhesive strips, a special kind of surgical glue is used to keep the incision closed.  · If adhesive strips were placed on the incision sites, do not take them off. They will fall off on their own.  · The incision site may be sore for 1 to 2 days. Pain medicine can help.  · Do not get the incision site wet. Bathe or shower as directed by your caregiver.  · The incision site should heal in 5 to 7 days. A small scar may form after the  incision has healed.  ACCESSING THE PORT  Special steps must be taken to access the port:  · Before the port is accessed, a numbing cream can be placed on the skin. This helps numb the skin over the port site.  · A sterile technique is used to access the port.  · The port is accessed with a needle. Only "non-coring" port needles should be used to access the port. Once the port is accessed, a blood return should be checked. This helps ensure the port is in the vein and is not clogged (clotted).  · If your caregiver believes your port should remain accessed, a clear (transparent) bandage will be placed over the needle site. The bandage and needle will need to be changed every week or as directed by your caregiver.  · Keep the bandage covering the needle clean and dry. Do not get it wet. Follow your caregiver's instructions on how to take a shower or bath when the port is accessed.  · If your port does not need to stay accessed, no bandage is needed over the port.  FLUSHING THE PORT  Flushing the port keeps it from getting clogged. How often the port is flushed depends on:  · If a   constant infusion is running. If a constant infusion is running, the port may not need to be flushed.  · If intermittent medicines are given.  · If the port is not being used.  For intermittent medicines:  · The port will need to be flushed:  · After medicines have been given.  · After blood has been drawn.  · As part of routine maintenance.  · A port is normally flushed with:  · Normal saline.  · Heparin.  · Follow your caregiver's advice on how often, how much, and the type of flush to use on your port.  IMPORTANT PORT INFORMATION  · Tell your caregiver if you are allergic to heparin.  · After your port is placed, you will get a manufacturer's information card. The card has information about your port. Keep this card with you at all times.  · There are many types of ports available. Know what kind of port you have.  · In case of an  emergency, it may be helpful to wear a medical alert bracelet. This can help alert health care workers that you have a port.  · The port can stay in for as long as your caregiver believes it is necessary.  · When it is time for the port to come out, surgery will be done to remove it. The surgery will be similar to how the port was put in.  · If you are in the hospital or clinic:  · Your port will be taken care of and flushed by a nurse.  · If you are at home:  · A home health care nurse may give medicines and take care of the port.  · You or a family member can get special training and directions for giving medicine and taking care of the port at home.  SEEK IMMEDIATE MEDICAL CARE IF:   · Your port does not flush or you are unable to get a blood return.  · New drainage or pus is coming from the incision.  · A bad smell is coming from the incision site.  · You develop swelling or increased redness at the incision site.  · You develop increased swelling or pain at the port site.  · You develop swelling or pain in the surrounding skin near the port.  · You have an oral temperature above 102° F (38.9° C), not controlled by medicine.  MAKE SURE YOU:   · Understand these instructions.  · Will watch your condition.  · Will get help right away if you are not doing well or get worse.  Document Released: 06/08/2005 Document Revised: 08/31/2011 Document Reviewed: 08/30/2008  ExitCare® Patient Information ©2014 [employer].

## 2013-01-25 ENCOUNTER — Other Ambulatory Visit: Payer: Self-pay

## 2013-03-02 ENCOUNTER — Telehealth: Payer: Self-pay | Admitting: Internal Medicine

## 2013-03-02 NOTE — Telephone Encounter (Signed)
LM FOR PT ABT TIME CHAMGE TO 2:45 INSTRUCTED PT TO RETURN CALL IN CONFIRM MESSAGE RECEIVED.

## 2013-03-17 ENCOUNTER — Ambulatory Visit (HOSPITAL_COMMUNITY)
Admission: RE | Admit: 2013-03-17 | Discharge: 2013-03-17 | Disposition: A | Discharge status: Home / Self Care | Payer: Medicare Other | Source: Ambulatory Visit | Attending: Oncology | Admitting: Oncology

## 2013-03-17 ENCOUNTER — Other Ambulatory Visit: Payer: Medicare Other | Admitting: Lab

## 2013-03-17 ENCOUNTER — Encounter (HOSPITAL_COMMUNITY): Payer: Self-pay

## 2013-03-17 ENCOUNTER — Ambulatory Visit (HOSPITAL_BASED_OUTPATIENT_CLINIC_OR_DEPARTMENT_OTHER): Payer: Medicare Other

## 2013-03-17 ENCOUNTER — Other Ambulatory Visit (HOSPITAL_BASED_OUTPATIENT_CLINIC_OR_DEPARTMENT_OTHER): Payer: Medicare Other

## 2013-03-17 VITALS — BP 150/68 | HR 65 | Temp 97.9°F

## 2013-03-17 DIAGNOSIS — I700 Atherosclerosis of aorta: Secondary | ICD-10-CM | POA: Insufficient documentation

## 2013-03-17 DIAGNOSIS — C8590 Non-Hodgkin lymphoma, unspecified, unspecified site: Secondary | ICD-10-CM

## 2013-03-17 DIAGNOSIS — Z87898 Personal history of other specified conditions: Secondary | ICD-10-CM | POA: Insufficient documentation

## 2013-03-17 DIAGNOSIS — C8290 Follicular lymphoma, unspecified, unspecified site: Secondary | ICD-10-CM

## 2013-03-17 DIAGNOSIS — C8299 Follicular lymphoma, unspecified, extranodal and solid organ sites: Secondary | ICD-10-CM

## 2013-03-17 DIAGNOSIS — N281 Cyst of kidney, acquired: Secondary | ICD-10-CM | POA: Insufficient documentation

## 2013-03-17 DIAGNOSIS — N400 Enlarged prostate without lower urinary tract symptoms: Secondary | ICD-10-CM | POA: Insufficient documentation

## 2013-03-17 DIAGNOSIS — R079 Chest pain, unspecified: Secondary | ICD-10-CM | POA: Insufficient documentation

## 2013-03-17 LAB — CBC WITH DIFFERENTIAL/PLATELET
BASO%: 1.1 % (ref 0.0–2.0)
Basophils Absolute: 0.1 10*3/uL (ref 0.0–0.1)
EOS%: 3.3 % (ref 0.0–7.0)
Eosinophils Absolute: 0.2 10*3/uL (ref 0.0–0.5)
HCT: 42.8 % (ref 38.4–49.9)
HGB: 14.7 g/dL (ref 13.0–17.1)
LYMPH%: 21.8 % (ref 14.0–49.0)
MCH: 29.4 pg (ref 27.2–33.4)
MCHC: 34.4 g/dL (ref 32.0–36.0)
MCV: 85.4 fL (ref 79.3–98.0)
MONO#: 0.7 10*3/uL (ref 0.1–0.9)
MONO%: 10 % (ref 0.0–14.0)
NEUT#: 4.6 10*3/uL (ref 1.5–6.5)
NEUT%: 63.8 % (ref 39.0–75.0)
Platelets: 186 10*3/uL (ref 140–400)
RBC: 5.01 10*6/uL (ref 4.20–5.82)
RDW: 14.5 % (ref 11.0–14.6)
WBC: 7.2 10*3/uL (ref 4.0–10.3)
lymph#: 1.6 10*3/uL (ref 0.9–3.3)

## 2013-03-17 LAB — COMPREHENSIVE METABOLIC PANEL (CC13)
ALT: 11 U/L (ref 0–55)
AST: 8 U/L (ref 5–34)
Albumin: 3.7 g/dL (ref 3.5–5.0)
Alkaline Phosphatase: 45 U/L (ref 40–150)
BUN: 23.4 mg/dL (ref 7.0–26.0)
CO2: 27 mEq/L (ref 22–29)
Calcium: 9.2 mg/dL (ref 8.4–10.4)
Chloride: 104 mEq/L (ref 98–109)
Creatinine: 1.1 mg/dL (ref 0.7–1.3)
Glucose: 114 mg/dl (ref 70–140)
Potassium: 4.3 mEq/L (ref 3.5–5.1)
Sodium: 141 mEq/L (ref 136–145)
Total Bilirubin: 0.57 mg/dL (ref 0.20–1.20)
Total Protein: 6.8 g/dL (ref 6.4–8.3)

## 2013-03-17 LAB — LACTATE DEHYDROGENASE (CC13): LDH: 148 U/L (ref 125–245)

## 2013-03-17 MED ORDER — SODIUM CHLORIDE 0.9 % IJ SOLN
10.0000 mL | Freq: Once | INTRAMUSCULAR | Status: AC
  Administered 2013-03-17: 10 mL
  Filled 2013-03-17: qty 10

## 2013-03-17 MED ORDER — IOHEXOL 300 MG/ML  SOLN
100.0000 mL | Freq: Once | INTRAMUSCULAR | Status: AC | PRN
  Administered 2013-03-17: 100 mL via INTRAVENOUS

## 2013-03-17 NOTE — Patient Instructions (Addendum)
Implanted Port Instructions  An implanted port is a central line that has a round shape and is placed under the skin. It is used for long-term IV (intravenous) access for:  · Medicine.  · Fluids.  · Liquid nutrition, such as TPN (total parenteral nutrition).  · Blood samples.  Ports can be placed:  · In the chest area just below the collarbone (this is the most common place.)  · In the arms.  · In the belly (abdomen) area.  · In the legs.  PARTS OF THE PORT  A port has 2 main parts:  · The reservoir. The reservoir is round, disc-shaped, and will be a small, raised area under your skin.  · The reservoir is the part where a needle is inserted (accessed) to either give medicines or to draw blood.  · The catheter. The catheter is a long, slender tube that extends from the reservoir. The catheter is placed into a large vein.  · Medicine that is inserted into the reservoir goes into the catheter and then into the vein.  INSERTION OF THE PORT  · The port is surgically placed in either an operating room or in a procedural area (interventional radiology).  · Medicine may be given to help you relax during the procedure.  · The skin where the port will be inserted is numbed (local anesthetic).  · 1 or 2 small cuts (incisions) will be made in the skin to insert the port.  · The port can be used after it has been inserted.  INCISION SITE CARE  · The incision site may have small adhesive strips on it. This helps keep the incision site closed. Sometimes, no adhesive strips are placed. Instead of adhesive strips, a special kind of surgical glue is used to keep the incision closed.  · If adhesive strips were placed on the incision sites, do not take them off. They will fall off on their own.  · The incision site may be sore for 1 to 2 days. Pain medicine can help.  · Do not get the incision site wet. Bathe or shower as directed by your caregiver.  · The incision site should heal in 5 to 7 days. A small scar may form after the  incision has healed.  ACCESSING THE PORT  Special steps must be taken to access the port:  · Before the port is accessed, a numbing cream can be placed on the skin. This helps numb the skin over the port site.  · A sterile technique is used to access the port.  · The port is accessed with a needle. Only "non-coring" port needles should be used to access the port. Once the port is accessed, a blood return should be checked. This helps ensure the port is in the vein and is not clogged (clotted).  · If your caregiver believes your port should remain accessed, a clear (transparent) bandage will be placed over the needle site. The bandage and needle will need to be changed every week or as directed by your caregiver.  · Keep the bandage covering the needle clean and dry. Do not get it wet. Follow your caregiver's instructions on how to take a shower or bath when the port is accessed.  · If your port does not need to stay accessed, no bandage is needed over the port.  FLUSHING THE PORT  Flushing the port keeps it from getting clogged. How often the port is flushed depends on:  · If a   constant infusion is running. If a constant infusion is running, the port may not need to be flushed.  · If intermittent medicines are given.  · If the port is not being used.  For intermittent medicines:  · The port will need to be flushed:  · After medicines have been given.  · After blood has been drawn.  · As part of routine maintenance.  · A port is normally flushed with:  · Normal saline.  · Heparin.  · Follow your caregiver's advice on how often, how much, and the type of flush to use on your port.  IMPORTANT PORT INFORMATION  · Tell your caregiver if you are allergic to heparin.  · After your port is placed, you will get a manufacturer's information card. The card has information about your port. Keep this card with you at all times.  · There are many types of ports available. Know what kind of port you have.  · In case of an  emergency, it may be helpful to wear a medical alert bracelet. This can help alert health care workers that you have a port.  · The port can stay in for as long as your caregiver believes it is necessary.  · When it is time for the port to come out, surgery will be done to remove it. The surgery will be similar to how the port was put in.  · If you are in the hospital or clinic:  · Your port will be taken care of and flushed by a nurse.  · If you are at home:  · A home health care nurse may give medicines and take care of the port.  · You or a family member can get special training and directions for giving medicine and taking care of the port at home.  SEEK IMMEDIATE MEDICAL CARE IF:   · Your port does not flush or you are unable to get a blood return.  · New drainage or pus is coming from the incision.  · A bad smell is coming from the incision site.  · You develop swelling or increased redness at the incision site.  · You develop increased swelling or pain at the port site.  · You develop swelling or pain in the surrounding skin near the port.  · You have an oral temperature above 102° F (38.9° C), not controlled by medicine.  MAKE SURE YOU:   · Understand these instructions.  · Will watch your condition.  · Will get help right away if you are not doing well or get worse.  Document Released: 06/08/2005 Document Revised: 08/31/2011 Document Reviewed: 08/30/2008  ExitCare® Patient Information ©2014 [employer].

## 2013-03-23 ENCOUNTER — Other Ambulatory Visit: Payer: Self-pay | Admitting: Internal Medicine

## 2013-03-23 DIAGNOSIS — C8290 Follicular lymphoma, unspecified, unspecified site: Secondary | ICD-10-CM

## 2013-03-24 ENCOUNTER — Ambulatory Visit (HOSPITAL_BASED_OUTPATIENT_CLINIC_OR_DEPARTMENT_OTHER): Payer: Medicare Other | Admitting: Internal Medicine

## 2013-03-24 ENCOUNTER — Telehealth: Payer: Self-pay | Admitting: Internal Medicine

## 2013-03-24 ENCOUNTER — Other Ambulatory Visit: Payer: Medicare Other | Admitting: Lab

## 2013-03-24 ENCOUNTER — Encounter: Payer: Self-pay | Admitting: Internal Medicine

## 2013-03-24 ENCOUNTER — Ambulatory Visit: Payer: Medicare Other | Admitting: Lab

## 2013-03-24 ENCOUNTER — Other Ambulatory Visit: Payer: Self-pay | Admitting: Internal Medicine

## 2013-03-24 VITALS — BP 134/68 | HR 76 | Temp 98.0°F | Resp 18 | Ht 69.0 in | Wt 201.6 lb

## 2013-03-24 DIAGNOSIS — C8299 Follicular lymphoma, unspecified, extranodal and solid organ sites: Secondary | ICD-10-CM

## 2013-03-24 DIAGNOSIS — C8290 Follicular lymphoma, unspecified, unspecified site: Secondary | ICD-10-CM

## 2013-03-24 NOTE — Telephone Encounter (Signed)
lvm for pt regarding to add on labs today.Marland KitchenMarland Kitchen

## 2013-03-24 NOTE — Patient Instructions (Signed)
Positron Emission Tomography (PET Scan) PET stands for positron emission tomography. This is a test similar to an X-ray. Pictures can be taken of a body part after injection of a very small dose of a chemical called a radionuclide. This is combined with sugar, water, or ammonia to give off tiny particles called positrons. The positrons emitted are like small bursts of energy that can be detected by a scanner. They are processed by a computer to create images. These images can be used to study different diseases. They are often used to study cancer and cancer therapy. A scan of the entire body can be done and used to study all its parts. Because this test is tagged to a sugar used by cells, the bursts of energy show up differently in cells that use sugar faster. The computer is able to produce a color-coded picture based on this. The colors and amount of brightness on a PET image show different levels of tissue or organ function. For example, a cancer grows faster than healthy tissue and uses more sugar than normal tissue. It will absorb more of the substance injected. This causes it to appear brighter than normal tissue on the PET image. A specialist will read and explain the images. Other examinations, such as recent CT (or CAT) scans or MRI scans may help with interpretation and should be brought along. There are usually no restrictions after the test. You should drink plenty of fluids to flush the radioactive substance from your body. BEFORE THE PROCEDURE   PET is usually an outpatient procedure. Wear comfortable, loose-fitting clothes.  Do not eat for four hours before the scan. You will be encouraged to drink water.  Your caregiver will instruct you regarding the use of medications before the test.  Note: Diabetic patients should ask for any specific diet guidelines to control glucose (sugar) levels during the day of the test. There are limitations with the test if your blood sugar is not controlled  during or before the test.  Be on time because of the rapid decay of the radioactive material that must be injected. PROCEDURE  Before the procedure begins a small amount of harmless radioactive material will be injected into a vein. This means you will have a needle stick. It will take from 30 minutes to one hour for the material to travel around your body in preparation for the scan. You will lie on a cushioned table and be moved through the center of a machine that looks like a large doughnut. This is the machine that detects the positrons. It is connected to a computer that produces images that can be viewed on a monitor. This will take about 30 minutes to an hour, during which you must remain still. Let your caregiver know if this will be difficult for you. Also, let your caregiver know if you need a sedative or help dealing with claustrophobia (feeling uncomfortable in enclosed spaces). HOME CARE INSTRUCTIONS   For the protection of your privacy, test results can not be given over the phone. Make sure you receive the results of your test. Ask as to how these results are to be obtained if you have not been informed. It is your responsibility to obtain your test results.  Drink several 8-once glasses of water following the test to flush the small amount of radioactive material out of your body.  Keep your follow-up appointments. Document Released: 12/13/2002 Document Revised: 08/31/2011 Document Reviewed: 06/08/2005 Exit[employer] Patient Information 2014 [employer].  

## 2013-03-24 NOTE — Telephone Encounter (Signed)
gv and printed appt sched and avs for pt for OCT. °

## 2013-03-26 NOTE — Progress Notes (Signed)
[name] OFFICE PROGRESS NOTE  Sean Hodges., MD [address] [place] [address]  DIAGNOSIS: Lymphoma, follicular - Plan: NM PET Image Restag (PS) Skull Base To Thigh  Chief Complaint  Patient presents with  . Lymphoma, follicular    CURRENT THERAPY: Observation  INTERVAL HISTORY: Sean Hodges 72 y.o. male with a history of follicular lymphoma presents for follow-up. He was last seen by Dr. Arline Asp on 09/23/2012. Today, he is accompanied by his wife Sean Hodges.  He has received the flu shot.  He denies any fevers or night sweats.   He denies any weight changes.  He reports feeling pretty well.   MEDICAL HISTORY: Past Medical History  Diagnosis Date  . Cancer     nhl    INTERIM HISTORY: has Lymphoma, follicular on his problem list.    ALLERGIES:  has No Known Allergies.  MEDICATIONS: has a current medication list which includes the following prescription(s): calcium & magnesium carbonates, cholecalciferol, coenzyme q10, cyanocobalamin, enalapril, fish oil-omega-3 fatty acids, glucosamine-chondroitin, pravastatin, ranitidine, and saw palmetto.  SURGICAL HISTORY:  Past Surgical History  Procedure Laterality Date  . Left eye surgery  2013   NHL HISTORY (BASED ON DR. Arline Asp PRIOR NOTE): 1. Non-Hodgkin's lymphoma diagnosed in May 2000. This apparently was a follicular grade 1, CD20 positive, non-Hodgkin's lymphoma with involvement of the left part of the thyroid, chest, and abdomen. The patient tells me that this was a diffuse large B-cell lymphoma, although I believe our records do not indicate that. In any event, he was treated with CHOP chemotherapy for 6 cycles from August 2000 through November 2000. He then received Rituxan for 8 cycles from November 2000 through February of 2001 and had a complete remission. He had recurrent lymphoma by PET scan on 02/04/2007 with a confirmatory biopsy of the right axillary lymph node on 02/24/2007. This showed  follicular lymphoma grade 1-2, CD20 positive. He had involvement of right axilla, retroperitoneum, and pelvic lymph nodes. He was having chest pain and night sweats. He was treated with Rituxan-CVP for 6 cycles with achievement of a complete remission. These treatments were given from September  2008 through January 2009. The patient then received 1 dose of  maintenance Rituxan on 12/30/2007 after we had seen him but did not want to continue with maintenance Rituxan. The patient's first visit here was on 12/19/2007.   REVIEW OF SYSTEMS:   Constitutional: Denies fevers, chills or abnormal weight loss Eyes: Denies blurriness of vision Ears, nose, mouth, throat, and face: Denies mucositis or sore throat Respiratory: Denies cough, dyspnea or wheezes Cardiovascular: Denies palpitation, chest discomfort or lower extremity swelling Gastrointestinal:  Denies nausea, heartburn or change in bowel habits Skin: Denies abnormal skin rashes Lymphatics: Denies new lymphadenopathy or easy bruising Neurological:Denies numbness, tingling or new weaknesses Behavioral/Psych: Mood is stable, no new changes  All other systems were reviewed with the patient and are negative.  PHYSICAL EXAMINATION: ECOG PERFORMANCE STATUS: 0 - Asymptomatic  Blood pressure 134/68, pulse 76, temperature 98 F (36.7 C), temperature source Oral, resp. rate 18, height 5\' 9"  (1.753 m), weight 201 lb 9.6 oz (91.445 kg), SpO2 97.00%.  GENERAL:alert, no distress and comfortable; mildly obese. SKIN: skin color, texture, turgor are normal, no rashes or significant lesions; right port-a-cath. EYES: normal, Conjunctiva are pink and non-injected, sclera clear OROPHARYNX:no exudate, no erythema and lips, buccal mucosa, and tongue normal  NECK: supple, thyroid normal size, non-tender, without nodularity LYMPH:  no palpable lymphadenopathy in the cervical,  axillary or supraclavicular LUNGS: clear to auscultation and percussion with normal  breathing effort HEART: regular rate & rhythm and no murmurs and no lower extremity edema ABDOMEN:abdomen soft, non-tender and normal bowel sounds Musculoskeletal:no cyanosis of digits and no clubbing  NEURO: alert & oriented x 3 with fluent speech, no focal motor/sensory deficits  Labs:  Lab Results  Component Value Date   WBC 7.2 03/17/2013   HGB 14.7 03/17/2013   HCT 42.8 03/17/2013   MCV 85.4 03/17/2013   PLT 186 03/17/2013   NEUTROABS 4.6 03/17/2013      Chemistry      Component Value Date/Time   NA 141 03/17/2013 0839   NA 138 01/27/2012 0819   NA 139 08/11/2010 1013   K 4.3 03/17/2013 0839   K 4.5 01/27/2012 0819   K 4.3 08/11/2010 1013   CL 104 09/23/2012 1104   CL 99 01/27/2012 0819   CL 102 08/11/2010 1013   CO2 27 03/17/2013 0839   CO2 28 01/27/2012 0819   CO2 26 08/11/2010 1013   BUN 23.4 03/17/2013 0839   BUN 19 01/27/2012 0819   BUN 18 08/11/2010 1013   CREATININE 1.1 03/17/2013 0839   CREATININE 1.1 01/27/2012 0819   CREATININE 1.06 08/11/2010 1013      Component Value Date/Time   CALCIUM 9.2 03/17/2013 0839   CALCIUM 9.0 01/27/2012 0819   CALCIUM 9.3 08/11/2010 1013   ALKPHOS 45 03/17/2013 0839   ALKPHOS 47 01/27/2012 0819   ALKPHOS 47 08/11/2010 1013   AST 8 03/17/2013 0839   AST 19 01/27/2012 0819   AST 15 08/11/2010 1013   ALT 11 03/17/2013 0839   ALT 24 01/27/2012 0819   ALT 25 08/11/2010 1013   BILITOT 0.57 03/17/2013 0839   BILITOT 0.70 01/27/2012 0819   BILITOT 0.6 08/11/2010 1013     RADIOGRAPHIC STUDIES: Dg Chest 2 View  03/17/2013   *RADIOLOGY REPORT*  Clinical Data: History of lymphoma.  Chest soreness.  CHEST - 2 VIEW  Comparison: CT chest 01/27/2012 and chest radiograph 02/28/2009.  Findings: Trachea is midline.  Heart size normal.  Thoracic aorta is calcified.  Right subclavian Port-A-Cath tip is in the SVC. Minimal scarring or subsegmental atelectasis along the hemidiaphragms bilaterally.  Lungs are mildly hyperinflated but otherwise clear.  No pleural fluid.  IMPRESSION: No  acute findings.   Original Report Authenticated By: Leanna Battles, M.D.   Ct Abdomen Pelvis W Contrast  03/17/2013   CLINICAL DATA:  recurrent lymphoma by PET scan on 02/04/2007 with a confirmatory biopsy of the right axillary lymph node on 02/24/2007. This showed follicular lymphoma grade 1-2, CD20 positive. He had involvement of right axilla, retroperitoneum, and pelvic lymph nodes.  EXAM: CT ABDOMEN AND PELVIS WITH CONTRAST  TECHNIQUE: Multidetector CT imaging of the abdomen and pelvis was performed using the standard protocol following bolus administration of intravenous contrast.  CONTRAST:  OMNIPAQUE IOHEXOL 300 MG/ML  SOLN  COMPARISON:  A 713  FINDINGS: Tiny low-density foci in the posterior right liver are unchanged. The spleen, stomach, duodenum, pancreas, gallbladder, and adrenal glands are unremarkable.  2.1 cm interpolar right renal cyst is stable. Left kidney is unremarkable.  No abdominal aortic aneurysm. The retroperitoneal, left para-aortic lymph nodes persist. 8 mm left para-aortic index node (image 28 today) was 10 mm previously. 6 mm left para-aortic lymph node on image 36 was measured on the previous study at 7 mm. 8 mm hepatoduodenal ligament lymph node seen on image 15 is stable.  Small lymph nodes in the gastrohepatic ligament are unchanged.  Imaging through the pelvis shows no intraperitoneal free fluid. Prostate gland is mildly enlarged. 14 mm short axis right common femoral lymph node was 10 mm on the previous study (see images 66 today). The 13 mm short axis right pelvic sidewall lymph node seen on the previous study is now 16 mm in short axis on image 63.  The terminal ileum is normal. The appendix is normal.  Bone windows reveal no worrisome lytic or sclerotic osseous lesions.  IMPRESSION: Stable appearance of the abdominal para-aortic lymph nodes and hepatoduodenal ligament lymph nodes, but lymph nodes in the right pelvic sidewall and right common femoral region shows slight  interval increase in size. PET-CT may prove helpful to assess for hypermetabolism in the right pelvic enlarging nodes.   Electronically Signed   By: Kennith Center M.D.   On: 03/17/2013 10:29    ASSESSMENT: WAGNER TANZI 72 y.o. male with a history of Lymphoma, follicular - Plan: NM PET Image Restag (PS) Skull Base To Thigh  PLAN:  --Mr. Shell continues to do well with no evidence for  progression of his lymphoma clinically. As stated, he is now out over 5 and one/half years from the time of his last treatment.   --We reviewed in detail his latest CT scan of his abdomen and chest as noted above.  We will follow up as requested by radiology with a PET scan to further characterize the lymph nodes in the right pelvic sidewall and right common femoral region which showed a slight interval increase in size. PET-CT may prove helpful to assess for hypermetabolism in the right pelvic enlarging nodes.    --All questions were answered. The patient knows to call the clinic with any problems, questions or concerns. We can certainly see the patient much sooner if necessary.  He was provided an after visit summary.  I spent 15 minutes counseling the patient face to face. The total time spent in the appointment was 25 minutes.    Sean Bollman, MD 03/24/2013  8:30 PM

## 2013-04-07 ENCOUNTER — Encounter (HOSPITAL_COMMUNITY)
Admission: RE | Admit: 2013-04-07 | Discharge: 2013-04-07 | Disposition: A | Discharge status: Home / Self Care | Payer: Medicare Other | Source: Ambulatory Visit | Attending: Internal Medicine | Admitting: Internal Medicine

## 2013-04-07 DIAGNOSIS — R599 Enlarged lymph nodes, unspecified: Secondary | ICD-10-CM | POA: Insufficient documentation

## 2013-04-07 DIAGNOSIS — C8290 Follicular lymphoma, unspecified, unspecified site: Secondary | ICD-10-CM

## 2013-04-07 DIAGNOSIS — C8299 Follicular lymphoma, unspecified, extranodal and solid organ sites: Secondary | ICD-10-CM | POA: Insufficient documentation

## 2013-04-07 LAB — GLUCOSE, CAPILLARY: Glucose-Capillary: 99 mg/dL (ref 70–99)

## 2013-04-07 MED ORDER — FLUDEOXYGLUCOSE F - 18 (FDG) INJECTION
17.6000 | Freq: Once | INTRAVENOUS | Status: AC | PRN
  Administered 2013-04-07: 17.6 via INTRAVENOUS

## 2013-04-11 ENCOUNTER — Telehealth: Payer: Self-pay | Admitting: Internal Medicine

## 2013-04-11 ENCOUNTER — Other Ambulatory Visit: Payer: Self-pay | Admitting: Internal Medicine

## 2013-04-11 ENCOUNTER — Ambulatory Visit (HOSPITAL_BASED_OUTPATIENT_CLINIC_OR_DEPARTMENT_OTHER): Payer: Medicare Other | Admitting: Internal Medicine

## 2013-04-11 VITALS — BP 135/76 | HR 60 | Temp 97.0°F | Resp 18 | Ht 69.0 in | Wt 206.4 lb

## 2013-04-11 DIAGNOSIS — C8290 Follicular lymphoma, unspecified, unspecified site: Secondary | ICD-10-CM

## 2013-04-11 DIAGNOSIS — C8299 Follicular lymphoma, unspecified, extranodal and solid organ sites: Secondary | ICD-10-CM

## 2013-04-11 DIAGNOSIS — L2089 Other atopic dermatitis: Secondary | ICD-10-CM

## 2013-04-11 NOTE — Telephone Encounter (Signed)
Gave pt appt for labs and Ct, gave pt oral contrast, still waiting for POF

## 2013-04-11 NOTE — Progress Notes (Signed)
[facility] OFFICE PROGRESS NOTE  Jeanie Sewer Valrie Hart., MD [address] [place] [address]  DIAGNOSIS: Lymphoma, follicular - Plan: CBC with Differential, Comprehensive metabolic panel, CBC with Differential, Comprehensive metabolic panel, Lactate dehydrogenase, Lactate dehydrogenase, CT Abdomen Pelvis W Contrast  Chief Complaint  Patient presents with  . Lymphoma, follicular    CURRENT THERAPY: Observation   INTERVAL HISTORY:  Sean Hodges 72 y.o. male with a history of follicular lymphoma presents for follow-up. He was last seen by me on 03/24/2013.  Today, he is accompanied by his wife Lucendia Herrlich. He has received the flu shot. He denies any fevers or night sweats. He denies any weight changes. He reports mild abdominal fullness but normal bowel movements. He denies any abdominal pain . He reports feeling pretty well. He had a nuclear PET scan done as followup for a CT of abdomen and pelvis is here to discuss her results. He saw his primary care physician and was started on protopic topical ointment for ectopic dermatitis of the face. His been taking this for the last several days with significant improvement he reports. Continues to have his Port-A-Cath flush every 8 weeks.  MEDICAL HISTORY: Past Medical History  Diagnosis Date  . Cancer     nhl    INTERIM HISTORY: has Lymphoma, follicular on his problem list.    ALLERGIES:  has No Known Allergies.  MEDICATIONS: has a current medication list which includes the following prescription(s): calcium & magnesium carbonates, cholecalciferol, coenzyme q10, cyanocobalamin, enalapril, fish oil-omega-3 fatty acids, glucosamine-chondroitin, pravastatin, ranitidine, and saw palmetto.  SURGICAL HISTORY:  Past Surgical History  Procedure Laterality Date  . Left eye surgery  2013    REVIEW OF SYSTEMS:   Constitutional: Denies fevers, chills or abnormal weight loss Eyes: Denies blurriness of vision Ears, nose, mouth,  throat, and face: Denies mucositis or sore throat Respiratory: Denies cough, dyspnea or wheezes Cardiovascular: Denies palpitation, chest discomfort or lower extremity swelling Gastrointestinal:  Denies nausea, heartburn or change in bowel habits Skin: Denies abnormal skin rashes Lymphatics: Denies new lymphadenopathy or easy bruising Neurological:Denies numbness, tingling or new weaknesses Behavioral/Psych: Mood is stable, no new changes  All other systems were reviewed with the patient and are negative.  PHYSICAL EXAMINATION: ECOG PERFORMANCE STATUS: 0 - Asymptomatic  Blood pressure 135/76, pulse 60, temperature 97 F (36.1 C), temperature source Oral, resp. rate 18, height 5\' 9"  (1.753 m), weight 206 lb 6.4 oz (93.622 kg).  GENERAL:alert, no distress and comfortable; mildly overweight. SKIN: skin color, texture, turgor are normal, no rashes or significant lesions; right Port-A-Cath EYES: normal, Conjunctiva are pink and non-injected, sclera clear OROPHARYNX:no exudate, no erythema and lips, buccal mucosa, and tongue normal  NECK: supple, thyroid normal size, non-tender, without nodularity LYMPH:  no palpable lymphadenopathy in the cervical, axillary or supraclavicular LUNGS: clear to auscultation and percussion with normal breathing effort HEART: regular rate & rhythm and no murmurs and no lower extremity edema ABDOMEN:abdomen soft, non-tender and normal bowel sounds Musculoskeletal:no cyanosis of digits and no clubbing  NEURO: alert & oriented x 3 with fluent speech, no focal motor/sensory deficits   LABORATORY DATA: No results found for this or any previous visit (from the past 48 hour(s)).     Labs:  Lab Results  Component Value Date   WBC 7.2 03/17/2013   HGB 14.7 03/17/2013   HCT 42.8 03/17/2013   MCV 85.4 03/17/2013   PLT 186 03/17/2013   NEUTROABS 4.6 03/17/2013  Chemistry      Component Value Date/Time   NA 141 03/17/2013 0839   NA 138 01/27/2012 0819   NA 139  08/11/2010 1013   K 4.3 03/17/2013 0839   K 4.5 01/27/2012 0819   K 4.3 08/11/2010 1013   CL 104 09/23/2012 1104   CL 99 01/27/2012 0819   CL 102 08/11/2010 1013   CO2 27 03/17/2013 0839   CO2 28 01/27/2012 0819   CO2 26 08/11/2010 1013   BUN 23.4 03/17/2013 0839   BUN 19 01/27/2012 0819   BUN 18 08/11/2010 1013   CREATININE 1.1 03/17/2013 0839   CREATININE 1.1 01/27/2012 0819   CREATININE 1.06 08/11/2010 1013      Component Value Date/Time   CALCIUM 9.2 03/17/2013 0839   CALCIUM 9.0 01/27/2012 0819   CALCIUM 9.3 08/11/2010 1013   ALKPHOS 45 03/17/2013 0839   ALKPHOS 47 01/27/2012 0819   ALKPHOS 47 08/11/2010 1013   AST 8 03/17/2013 0839   AST 19 01/27/2012 0819   AST 15 08/11/2010 1013   ALT 11 03/17/2013 0839   ALT 24 01/27/2012 0819   ALT 25 08/11/2010 1013   BILITOT 0.57 03/17/2013 0839   BILITOT 0.70 01/27/2012 0819   BILITOT 0.6 08/11/2010 1013        Recent Labs Lab 04/07/13 1049  GLUCAP 99   Studies:  No results found.   RADIOGRAPHIC STUDIES: Dg Chest 2 View  03/17/2013   *RADIOLOGY REPORT*  Clinical Data: History of lymphoma.  Chest soreness.  CHEST - 2 VIEW  Comparison: CT chest 01/27/2012 and chest radiograph 02/28/2009.  Findings: Trachea is midline.  Heart size normal.  Thoracic aorta is calcified.  Right subclavian Port-A-Cath tip is in the SVC. Minimal scarring or subsegmental atelectasis along the hemidiaphragms bilaterally.  Lungs are mildly hyperinflated but otherwise clear.  No pleural fluid.  IMPRESSION: No acute findings.   Original Report Authenticated By: Leanna Battles, M.D.   Ct Abdomen Pelvis W Contrast  03/17/2013   CLINICAL DATA:  recurrent lymphoma by PET scan on 02/04/2007 with a confirmatory biopsy of the right axillary lymph node on 02/24/2007. This showed follicular lymphoma grade 1-2, CD20 positive. He had involvement of right axilla, retroperitoneum, and pelvic lymph nodes.  EXAM: CT ABDOMEN AND PELVIS WITH CONTRAST  TECHNIQUE: Multidetector CT imaging of the abdomen and  pelvis was performed using the standard protocol following bolus administration of intravenous contrast.  CONTRAST:  OMNIPAQUE IOHEXOL 300 MG/ML  SOLN  COMPARISON:  A 713  FINDINGS: Tiny low-density foci in the posterior right liver are unchanged. The spleen, stomach, duodenum, pancreas, gallbladder, and adrenal glands are unremarkable.  2.1 cm interpolar right renal cyst is stable. Left kidney is unremarkable.  No abdominal aortic aneurysm. The retroperitoneal, left para-aortic lymph nodes persist. 8 mm left para-aortic index node (image 28 today) was 10 mm previously. 6 mm left para-aortic lymph node on image 36 was measured on the previous study at 7 mm. 8 mm hepatoduodenal ligament lymph node seen on image 15 is stable. Small lymph nodes in the gastrohepatic ligament are unchanged.  Imaging through the pelvis shows no intraperitoneal free fluid. Prostate gland is mildly enlarged. 14 mm short axis right common femoral lymph node was 10 mm on the previous study (see images 66 today). The 13 mm short axis right pelvic sidewall lymph node seen on the previous study is now 16 mm in short axis on image 63.  The terminal ileum is normal. The appendix is  normal.  Bone windows reveal no worrisome lytic or sclerotic osseous lesions.  IMPRESSION: Stable appearance of the abdominal para-aortic lymph nodes and hepatoduodenal ligament lymph nodes, but lymph nodes in the right pelvic sidewall and right common femoral region shows slight interval increase in size. PET-CT may prove helpful to assess for hypermetabolism in the right pelvic enlarging nodes.   Electronically Signed   By: Kennith Center M.D.   On: 03/17/2013 10:29   Nm Pet Image Restag (ps) Skull Base To Thigh (SCAN was individually reviewed by me).   04/07/2013   CLINICAL DATA:  Subsequent treatment strategy for lymphoma.  EXAM: NUCLEAR MEDICINE PET SKULL BASE TO THIGH  FASTING BLOOD GLUCOSE:  Value:  99 mg/dl  TECHNIQUE: 16.1 mCi W-96 FDG was injected  intravenously. CT data was obtained and used for attenuation correction and anatomic localization only. (This was not acquired as a diagnostic CT examination.) Additional exam technical data entered on technologist worksheet.  COMPARISON:  Multiple exams, including 03/17/2013 and 02/12/2009  FINDINGS: NECK  No hypermetabolic lymph nodes in the neck.  CHEST  No hypermetabolic mediastinal or hilar nodes. No suspicious pulmonary nodules on the CT data.  ABDOMEN/PELVIS  Right external iliac lymph node, 1.7 cm in short axis, maximum standard uptake value 9.5. Additional adjacent external iliac nodes are present on the right.  In could scattered small periaortic lymph nodes observed, measuring up to 1.3 cm in short axis, maximum standard uptake value 6.9. A right common iliac node with short axis diameter 1.0 cm has a maximum standard uptake value of 6.1.  SKELETON  No focal hypermetabolic activity to suggest skeletal metastasis. Degenerative subcortical cyst formation is present along the acetabular roof bilaterally.  IMPRESSION: 1. Hypermetabolic retroperitoneal and pelvic adenopathy. The most active lymph node is a right external iliac node which has a maximum standard uptake value of 9.5. Appearance favors recurrent lymphoma.   Electronically Signed   By: Herbie Baltimore M.D.   On: 04/07/2013 13:19    ASSESSMENT: Sean Hodges 72 y.o. male with a history of Lymphoma, follicular - Plan: CBC with Differential, Comprehensive metabolic panel, CBC with Differential, Comprehensive metabolic panel, Lactate dehydrogenase, Lactate dehydrogenase, CT Abdomen Pelvis W Contrast  PLAN:  1. Follicular Lymphoma --Mr. Peggs continues to do well with no evidence for  progression of his lymphoma clinically. As stated, he is now out over 5 and one/half years from the time of his last treatment. We reviewed his PET CT (04/07/2013) which was consistent with hypermetabolic retroperitoneal and pelvic adenopathy with the most  active lymph node in the right external iliac no which had a maximum standard uptake body of 9.5. In addition we again reviewed in detail his latest CT scan of his abdomen and chest as noted above.   --We again reviewed in detail NCCN Guidelines version 4.20144 follicular lymphoma grade 1 through 2. We reviewed the GELF criteria (Solal-Celigny P et. Al, J. Clin Oncol [phone]) which indicated that he was negative for involvement of greater than and equal to 3 nodal sites, each with a diameter of greater than and equal to 3 cm; negative for any nodal or extranodal tumor mass with a diameter of greater than or equal to 7 cm; negative for any B. symptoms including night sweats fevers weight loss; negative for splenomegaly; negative for pleural effusions or peritoneal ascites; negative for cytopenias or leukemia.  Given his absence of symptoms, threatened end-organ function, cytopenias secondary to lymphoma, or bulky disease we will continue to observe.  Main risk factor continues to be his age greater than 6 years old.  -- The patient understood the risks of treatment delay included worsening of his lymphadenopathy is characterize by imaging and or worsening of clinical symptoms. The benefit of treatment delay included decrease exposure to chemotherapies which might be myelosuppressive core have other significant side effects. He understood the risks and benefits clearly and chose to continue observation.We then discussed that there was the possibility of histologic transformation in patients with progressive disease especially if they're LDH levels are rising or there is presence of new B. symptoms. He has none of the above at this time.  2. Atopic dermatitis, resolving. --We advised the patient that tacrolimus topical has been associated with lymphadenopathy in the development of lymphoma in rare cases. As a result, he should minimize exposure to this medication as it will increase his risk and or  make the appearance of any new adenopathy uncertain. He will further assess the risk and benefits, and alternative therapies with his dermatologist.    3. Follow-up. --We will repeat CT abdomen and pelvis with contrast scans in 6 months to assess for steady progression. Additionally, we will repeat labs including chemistries, CBC and LDH in 4 months.  --All questions were answered. The patient knows to call the clinic with any problems, questions or concerns. We can certainly see the patient much sooner if necessary. He was provided an after visit summary. He will have his port-a-cath flushed every 8 weeks.  I spent 15 minutes counseling the patient face to face. The total time spent in the appointment was 25 minutes.    Tylie Golonka, MD 04/11/2013 1:01 PM

## 2013-04-13 ENCOUNTER — Telehealth: Payer: Self-pay | Admitting: Internal Medicine

## 2013-04-13 NOTE — Telephone Encounter (Signed)
, °

## 2013-04-27 ENCOUNTER — Other Ambulatory Visit: Payer: Self-pay

## 2013-05-17 ENCOUNTER — Ambulatory Visit (HOSPITAL_BASED_OUTPATIENT_CLINIC_OR_DEPARTMENT_OTHER): Payer: Medicare Other

## 2013-05-17 VITALS — BP 148/65 | HR 73 | Temp 96.8°F

## 2013-05-17 DIAGNOSIS — Z452 Encounter for adjustment and management of vascular access device: Secondary | ICD-10-CM

## 2013-05-17 DIAGNOSIS — C8590 Non-Hodgkin lymphoma, unspecified, unspecified site: Secondary | ICD-10-CM

## 2013-05-17 DIAGNOSIS — C8299 Follicular lymphoma, unspecified, extranodal and solid organ sites: Secondary | ICD-10-CM

## 2013-05-17 MED ORDER — HEPARIN SOD (PORK) LOCK FLUSH 100 UNIT/ML IV SOLN
500.0000 [IU] | Freq: Once | INTRAVENOUS | Status: AC
  Administered 2013-05-17: 500 [IU] via INTRAVENOUS
  Filled 2013-05-17: qty 5

## 2013-05-17 MED ORDER — SODIUM CHLORIDE 0.9 % IJ SOLN
10.0000 mL | INTRAMUSCULAR | Status: DC | PRN
  Administered 2013-05-17: 10 mL via INTRAVENOUS
  Filled 2013-05-17: qty 10

## 2013-05-17 NOTE — Addendum Note (Signed)
Addended by: Amado Coe on: 05/17/2013 02:57 PM   Modules accepted: Orders, SmartSet

## 2013-05-17 NOTE — Patient Instructions (Signed)
Implanted Port Instructions  An implanted port is a central line that has a round shape and is placed under the skin. It is used for long-term IV (intravenous) access for:  · Medicine.  · Fluids.  · Liquid nutrition, such as TPN (total parenteral nutrition).  · Blood samples.  Ports can be placed:  · In the chest area just below the collarbone (this is the most common place.)  · In the arms.  · In the belly (abdomen) area.  · In the legs.  PARTS OF THE PORT  A port has 2 main parts:  · The reservoir. The reservoir is round, disc-shaped, and will be a small, raised area under your skin.  · The reservoir is the part where a needle is inserted (accessed) to either give medicines or to draw blood.  · The catheter. The catheter is a long, slender tube that extends from the reservoir. The catheter is placed into a large vein.  · Medicine that is inserted into the reservoir goes into the catheter and then into the vein.  INSERTION OF THE PORT  · The port is surgically placed in either an operating room or in a procedural area (interventional radiology).  · Medicine may be given to help you relax during the procedure.  · The skin where the port will be inserted is numbed (local anesthetic).  · 1 or 2 small cuts (incisions) will be made in the skin to insert the port.  · The port can be used after it has been inserted.  INCISION SITE CARE  · The incision site may have small adhesive strips on it. This helps keep the incision site closed. Sometimes, no adhesive strips are placed. Instead of adhesive strips, a special kind of surgical glue is used to keep the incision closed.  · If adhesive strips were placed on the incision sites, do not take them off. They will fall off on their own.  · The incision site may be sore for 1 to 2 days. Pain medicine can help.  · Do not get the incision site wet. Bathe or shower as directed by your caregiver.  · The incision site should heal in 5 to 7 days. A small scar may form after the  incision has healed.  ACCESSING THE PORT  Special steps must be taken to access the port:  · Before the port is accessed, a numbing cream can be placed on the skin. This helps numb the skin over the port site.  · A sterile technique is used to access the port.  · The port is accessed with a needle. Only "non-coring" port needles should be used to access the port. Once the port is accessed, a blood return should be checked. This helps ensure the port is in the vein and is not clogged (clotted).  · If your caregiver believes your port should remain accessed, a clear (transparent) bandage will be placed over the needle site. The bandage and needle will need to be changed every week or as directed by your caregiver.  · Keep the bandage covering the needle clean and dry. Do not get it wet. Follow your caregiver's instructions on how to take a shower or bath when the port is accessed.  · If your port does not need to stay accessed, no bandage is needed over the port.  FLUSHING THE PORT  Flushing the port keeps it from getting clogged. How often the port is flushed depends on:  · If a   constant infusion is running. If a constant infusion is running, the port may not need to be flushed.  · If intermittent medicines are given.  · If the port is not being used.  For intermittent medicines:  · The port will need to be flushed:  · After medicines have been given.  · After blood has been drawn.  · As part of routine maintenance.  · A port is normally flushed with:  · Normal saline.  · Heparin.  · Follow your caregiver's advice on how often, how much, and the type of flush to use on your port.  IMPORTANT PORT INFORMATION  · Tell your caregiver if you are allergic to heparin.  · After your port is placed, you will get a manufacturer's information card. The card has information about your port. Keep this card with you at all times.  · There are many types of ports available. Know what kind of port you have.  · In case of an  emergency, it may be helpful to wear a medical alert bracelet. This can help alert health care workers that you have a port.  · The port can stay in for as long as your caregiver believes it is necessary.  · When it is time for the port to come out, surgery will be done to remove it. The surgery will be similar to how the port was put in.  · If you are in the hospital or clinic:  · Your port will be taken care of and flushed by a nurse.  · If you are at home:  · A home health care nurse may give medicines and take care of the port.  · You or a family member can get special training and directions for giving medicine and taking care of the port at home.  SEEK IMMEDIATE MEDICAL CARE IF:   · Your port does not flush or you are unable to get a blood return.  · New drainage or pus is coming from the incision.  · A bad smell is coming from the incision site.  · You develop swelling or increased redness at the incision site.  · You develop increased swelling or pain at the port site.  · You develop swelling or pain in the surrounding skin near the port.  · You have an oral temperature above 102° F (38.9° C), not controlled by medicine.  MAKE SURE YOU:   · Understand these instructions.  · Will watch your condition.  · Will get help right away if you are not doing well or get worse.  Document Released: 06/08/2005 Document Revised: 08/31/2011 Document Reviewed: 08/30/2008  [employer]® Patient Information ©2014 [employer].

## 2013-07-17 ENCOUNTER — Other Ambulatory Visit (HOSPITAL_BASED_OUTPATIENT_CLINIC_OR_DEPARTMENT_OTHER): Payer: Medicare Other

## 2013-07-17 ENCOUNTER — Ambulatory Visit (HOSPITAL_BASED_OUTPATIENT_CLINIC_OR_DEPARTMENT_OTHER): Payer: Medicare Other

## 2013-07-17 VITALS — BP 117/63 | HR 64 | Temp 97.6°F

## 2013-07-17 DIAGNOSIS — C8299 Follicular lymphoma, unspecified, extranodal and solid organ sites: Secondary | ICD-10-CM

## 2013-07-17 DIAGNOSIS — Z95828 Presence of other vascular implants and grafts: Secondary | ICD-10-CM

## 2013-07-17 DIAGNOSIS — C8290 Follicular lymphoma, unspecified, unspecified site: Secondary | ICD-10-CM

## 2013-07-17 DIAGNOSIS — Z452 Encounter for adjustment and management of vascular access device: Secondary | ICD-10-CM

## 2013-07-17 LAB — COMPREHENSIVE METABOLIC PANEL (CC13)
ALT: 17 U/L (ref 0–55)
AST: 9 U/L (ref 5–34)
Albumin: 4.2 g/dL (ref 3.5–5.0)
Alkaline Phosphatase: 45 U/L (ref 40–150)
Anion Gap: 9 mEq/L (ref 3–11)
BUN: 23.3 mg/dL (ref 7.0–26.0)
CO2: 27 mEq/L (ref 22–29)
Calcium: 9.5 mg/dL (ref 8.4–10.4)
Chloride: 104 mEq/L (ref 98–109)
Creatinine: 1.2 mg/dL (ref 0.7–1.3)
Glucose: 97 mg/dl (ref 70–140)
Potassium: 4.4 mEq/L (ref 3.5–5.1)
Sodium: 140 mEq/L (ref 136–145)
Total Bilirubin: 0.64 mg/dL (ref 0.20–1.20)
Total Protein: 6.6 g/dL (ref 6.4–8.3)

## 2013-07-17 LAB — CBC WITH DIFFERENTIAL/PLATELET
BASO%: 1.2 % (ref 0.0–2.0)
Basophils Absolute: 0.1 10*3/uL (ref 0.0–0.1)
EOS%: 2.9 % (ref 0.0–7.0)
Eosinophils Absolute: 0.2 10*3/uL (ref 0.0–0.5)
HCT: 43.1 % (ref 38.4–49.9)
HGB: 14.7 g/dL (ref 13.0–17.1)
LYMPH%: 20.7 % (ref 14.0–49.0)
MCH: 29.4 pg (ref 27.2–33.4)
MCHC: 34.2 g/dL (ref 32.0–36.0)
MCV: 86 fL (ref 79.3–98.0)
MONO#: 0.7 10*3/uL (ref 0.1–0.9)
MONO%: 12 % (ref 0.0–14.0)
NEUT#: 3.6 10*3/uL (ref 1.5–6.5)
NEUT%: 63.2 % (ref 39.0–75.0)
Platelets: 171 10*3/uL (ref 140–400)
RBC: 5.02 10*6/uL (ref 4.20–5.82)
RDW: 13.6 % (ref 11.0–14.6)
WBC: 5.7 10*3/uL (ref 4.0–10.3)
lymph#: 1.2 10*3/uL (ref 0.9–3.3)

## 2013-07-17 LAB — LACTATE DEHYDROGENASE (CC13): LDH: 150 U/L (ref 125–245)

## 2013-07-17 MED ORDER — HEPARIN SOD (PORK) LOCK FLUSH 100 UNIT/ML IV SOLN
500.0000 [IU] | Freq: Once | INTRAVENOUS | Status: AC
  Administered 2013-07-17: 500 [IU] via INTRAVENOUS
  Filled 2013-07-17: qty 5

## 2013-07-17 MED ORDER — SODIUM CHLORIDE 0.9 % IJ SOLN
10.0000 mL | INTRAMUSCULAR | Status: DC | PRN
  Administered 2013-07-17: 10 mL via INTRAVENOUS
  Filled 2013-07-17: qty 10

## 2013-07-17 NOTE — Patient Instructions (Signed)

## 2013-09-11 ENCOUNTER — Other Ambulatory Visit: Payer: Medicare Other | Admitting: Lab

## 2013-09-11 ENCOUNTER — Ambulatory Visit (HOSPITAL_BASED_OUTPATIENT_CLINIC_OR_DEPARTMENT_OTHER): Payer: Medicare Other

## 2013-09-11 ENCOUNTER — Ambulatory Visit (HOSPITAL_COMMUNITY)
Admission: RE | Admit: 2013-09-11 | Discharge: 2013-09-11 | Disposition: A | Discharge status: Home / Self Care | Payer: Medicare Other | Source: Ambulatory Visit | Attending: Internal Medicine | Admitting: Internal Medicine

## 2013-09-11 ENCOUNTER — Other Ambulatory Visit (HOSPITAL_BASED_OUTPATIENT_CLINIC_OR_DEPARTMENT_OTHER): Payer: Medicare Other

## 2013-09-11 VITALS — BP 125/60 | HR 61 | Temp 97.4°F

## 2013-09-11 DIAGNOSIS — Z452 Encounter for adjustment and management of vascular access device: Secondary | ICD-10-CM

## 2013-09-11 DIAGNOSIS — C8290 Follicular lymphoma, unspecified, unspecified site: Secondary | ICD-10-CM

## 2013-09-11 DIAGNOSIS — C8299 Follicular lymphoma, unspecified, extranodal and solid organ sites: Secondary | ICD-10-CM

## 2013-09-11 DIAGNOSIS — Z95828 Presence of other vascular implants and grafts: Secondary | ICD-10-CM

## 2013-09-11 DIAGNOSIS — C8589 Other specified types of non-Hodgkin lymphoma, extranodal and solid organ sites: Secondary | ICD-10-CM | POA: Insufficient documentation

## 2013-09-11 DIAGNOSIS — R599 Enlarged lymph nodes, unspecified: Secondary | ICD-10-CM | POA: Insufficient documentation

## 2013-09-11 DIAGNOSIS — J9819 Other pulmonary collapse: Secondary | ICD-10-CM | POA: Insufficient documentation

## 2013-09-11 LAB — CBC WITH DIFFERENTIAL/PLATELET
BASO%: 0.9 % (ref 0.0–2.0)
Basophils Absolute: 0.1 10*3/uL (ref 0.0–0.1)
EOS%: 3.5 % (ref 0.0–7.0)
Eosinophils Absolute: 0.2 10*3/uL (ref 0.0–0.5)
HCT: 43.4 % (ref 38.4–49.9)
HGB: 14.8 g/dL (ref 13.0–17.1)
LYMPH%: 18.1 % (ref 14.0–49.0)
MCH: 29.1 pg (ref 27.2–33.4)
MCHC: 34 g/dL (ref 32.0–36.0)
MCV: 85.6 fL (ref 79.3–98.0)
MONO#: 0.7 10*3/uL (ref 0.1–0.9)
MONO%: 9.3 % (ref 0.0–14.0)
NEUT#: 4.9 10*3/uL (ref 1.5–6.5)
NEUT%: 68.2 % (ref 39.0–75.0)
Platelets: 187 10*3/uL (ref 140–400)
RBC: 5.07 10*6/uL (ref 4.20–5.82)
RDW: 13.9 % (ref 11.0–14.6)
WBC: 7.2 10*3/uL (ref 4.0–10.3)
lymph#: 1.3 10*3/uL (ref 0.9–3.3)

## 2013-09-11 LAB — COMPREHENSIVE METABOLIC PANEL (CC13)
ALT: 11 U/L (ref 0–55)
AST: 7 U/L (ref 5–34)
Albumin: 4 g/dL (ref 3.5–5.0)
Alkaline Phosphatase: 45 U/L (ref 40–150)
Anion Gap: 11 mEq/L (ref 3–11)
BUN: 22.6 mg/dL (ref 7.0–26.0)
CO2: 26 mEq/L (ref 22–29)
Calcium: 9.4 mg/dL (ref 8.4–10.4)
Chloride: 103 mEq/L (ref 98–109)
Creatinine: 1 mg/dL (ref 0.7–1.3)
Glucose: 114 mg/dl (ref 70–140)
Potassium: 4.3 mEq/L (ref 3.5–5.1)
Sodium: 140 mEq/L (ref 136–145)
Total Bilirubin: 0.44 mg/dL (ref 0.20–1.20)
Total Protein: 6.7 g/dL (ref 6.4–8.3)

## 2013-09-11 LAB — LACTATE DEHYDROGENASE (CC13): LDH: 143 U/L (ref 125–245)

## 2013-09-11 MED ORDER — IOHEXOL 300 MG/ML  SOLN
100.0000 mL | Freq: Once | INTRAMUSCULAR | Status: AC | PRN
  Administered 2013-09-11: 100 mL via INTRAVENOUS

## 2013-09-11 MED ORDER — SODIUM CHLORIDE 0.9 % IJ SOLN
10.0000 mL | INTRAMUSCULAR | Status: DC | PRN
  Administered 2013-09-11: 10 mL via INTRAVENOUS
  Filled 2013-09-11: qty 10

## 2013-09-11 MED ORDER — HEPARIN SOD (PORK) LOCK FLUSH 100 UNIT/ML IV SOLN
500.0000 [IU] | Freq: Once | INTRAVENOUS | Status: AC
  Administered 2013-09-11: 500 [IU] via INTRAVENOUS
  Filled 2013-09-11: qty 5

## 2013-09-11 NOTE — Patient Instructions (Signed)

## 2013-09-13 ENCOUNTER — Encounter: Payer: Self-pay | Admitting: Internal Medicine

## 2013-09-13 ENCOUNTER — Ambulatory Visit (HOSPITAL_BASED_OUTPATIENT_CLINIC_OR_DEPARTMENT_OTHER): Payer: Medicare Other | Admitting: Internal Medicine

## 2013-09-13 ENCOUNTER — Telehealth: Payer: Self-pay | Admitting: Internal Medicine

## 2013-09-13 VITALS — BP 133/68 | HR 70 | Temp 98.0°F | Resp 18 | Ht 69.0 in | Wt 200.9 lb

## 2013-09-13 DIAGNOSIS — G620 Drug-induced polyneuropathy: Secondary | ICD-10-CM

## 2013-09-13 DIAGNOSIS — C8299 Follicular lymphoma, unspecified, extranodal and solid organ sites: Secondary | ICD-10-CM

## 2013-09-13 DIAGNOSIS — C8290 Follicular lymphoma, unspecified, unspecified site: Secondary | ICD-10-CM

## 2013-09-13 DIAGNOSIS — L259 Unspecified contact dermatitis, unspecified cause: Secondary | ICD-10-CM

## 2013-09-13 DIAGNOSIS — T451X5A Adverse effect of antineoplastic and immunosuppressive drugs, initial encounter: Secondary | ICD-10-CM

## 2013-09-13 MED ORDER — GABAPENTIN 300 MG PO CAPS: ORAL_CAPSULE | ORAL | Status: DC

## 2013-09-13 NOTE — Patient Instructions (Signed)
Gabapentin capsules or tablets What is this medicine? GABAPENTIN (GA ba pen tin) is used to control partial seizures in adults with epilepsy. It is also used to treat certain types of nerve pain. This medicine may be used for other purposes; ask your health care provider or pharmacist if you have questions. COMMON BRAND NAME(S): Gabarone , Neurontin What should I tell my health care provider before I take this medicine? They need to know if you have any of these conditions: -kidney disease -suicidal thoughts, plans, or attempt; a previous suicide attempt by you or a family member -an unusual or allergic reaction to gabapentin, other medicines, foods, dyes, or preservatives -pregnant or trying to get pregnant -breast-feeding How should I use this medicine? Take this medicine by mouth with a glass of water. Follow the directions on the prescription label. You can take it with or without food. If it upsets your stomach, take it with food.Take your medicine at regular intervals. Do not take it more often than directed. Do not stop taking except on your doctor's advice. If you are directed to break the 600 or 800 mg tablets in half as part of your dose, the extra half tablet should be used for the next dose. If you have not used the extra half tablet within 28 days, it should be thrown away. A special MedGuide will be given to you by the pharmacist with each prescription and refill. Be sure to read this information carefully each time. Talk to your pediatrician regarding the use of this medicine in children. Special care may be needed. Overdosage: If you think you have taken too much of this medicine contact a poison control center or emergency room at once. NOTE: This medicine is only for you. Do not share this medicine with others. What if I miss a dose? If you miss a dose, take it as soon as you can. If it is almost time for your next dose, take only that dose. Do not take double or extra  doses. What may interact with this medicine? Do not take this medicine with any of the following medications: -other gabapentin products This medicine may also interact with the following medications: -alcohol -antacids -antihistamines for allergy, cough and cold -certain medicines for anxiety or sleep -certain medicines for depression or psychotic disturbances -homatropine; hydrocodone -naproxen -narcotic medicines (opiates) for pain -phenothiazines like chlorpromazine, mesoridazine, prochlorperazine, thioridazine This list may not describe all possible interactions. Give your health care provider a list of all the medicines, herbs, non-prescription drugs, or dietary supplements you use. Also tell them if you smoke, drink alcohol, or use illegal drugs. Some items may interact with your medicine. What should I watch for while using this medicine? Visit your doctor or health care professional for regular checks on your progress. You may want to keep a record at home of how you feel your condition is responding to treatment. You may want to share this information with your doctor or health care professional at each visit. You should contact your doctor or health care professional if your seizures get worse or if you have any new types of seizures. Do not stop taking this medicine or any of your seizure medicines unless instructed by your doctor or health care professional. Stopping your medicine suddenly can increase your seizures or their severity. Wear a medical identification bracelet or chain if you are taking this medicine for seizures, and carry a card that lists all your medications. You may get drowsy, dizzy, or have   blurred vision. Do not drive, use machinery, or do anything that needs mental alertness until you know how this medicine affects you. To reduce dizzy or fainting spells, do not sit or stand up quickly, especially if you are an older patient. Alcohol can increase drowsiness and  dizziness. Avoid alcoholic drinks. Your mouth may get dry. Chewing sugarless gum or sucking hard candy, and drinking plenty of water will help. The use of this medicine may increase the chance of suicidal thoughts or actions. Pay special attention to how you are responding while on this medicine. Any worsening of mood, or thoughts of suicide or dying should be reported to your health care professional right away. Women who become pregnant while using this medicine may enroll in the [place] Antiepileptic Drug Pregnancy Registry by calling 1-888-233-2334. This registry collects information about the safety of antiepileptic drug use during pregnancy. What side effects may I notice from receiving this medicine? Side effects that you should report to your doctor or health care professional as soon as possible: -allergic reactions like skin rash, itching or hives, swelling of the face, lips, or tongue -worsening of mood, thoughts or actions of suicide or dying Side effects that usually do not require medical attention (report to your doctor or health care professional if they continue or are bothersome): -constipation -difficulty walking or controlling muscle movements -dizziness -nausea -slurred speech -tiredness -tremors -weight gain This list may not describe all possible side effects. Call your doctor for medical advice about side effects. You may report side effects to FDA at 1-800-FDA-1088. Where should I keep my medicine? Keep out of reach of children. Store at room temperature between 15 and 30 degrees C (59 and 86 degrees F). Throw away any unused medicine after the expiration date. NOTE: This sheet is a summary. It may not cover all possible information. If you have questions about this medicine, talk to your doctor, pharmacist, or health care provider.  2014, [employer]. (2013-02-09 09:12:48)  

## 2013-09-13 NOTE — Telephone Encounter (Signed)
gv and printed appt sched and avs for pt for April adn May °

## 2013-09-17 DIAGNOSIS — T451X5A Adverse effect of antineoplastic and immunosuppressive drugs, initial encounter: Secondary | ICD-10-CM

## 2013-09-17 DIAGNOSIS — G620 Drug-induced polyneuropathy: Secondary | ICD-10-CM | POA: Insufficient documentation

## 2013-09-17 HISTORY — DX: Adverse effect of antineoplastic and immunosuppressive drugs, initial encounter: T45.1X5A

## 2013-09-17 NOTE — Progress Notes (Signed)
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 Lymphoma, follicular    CURRENT THERAPY: Observation   INTERVAL HISTORY:  Sean Hodges 73 y.o. male with a history of follicular lymphoma presents for follow-up. He was last seen by me on 04/11/2013.  Today, he is accompanied by his wife Sean Hodges.  He denies any fevers or night sweats. He denies any weight changes. He reports mild abdominal fullness but normal bowel movements. He denies any abdominal pain . He reports feeling pretty well. He had a CT of abdomen and pelvis is here to discuss her results. Continues to have his Port-A-Cath flush every 8 weeks. He reports worsening of his tingling in feet bilaterally.  He denies falls.  This bothers him mostly at night.   MEDICAL HISTORY: Past Medical History  Diagnosis Date  . Cancer     nhl    INTERIM HISTORY: has Lymphoma, follicular on his problem list.    ALLERGIES:  has No Known Allergies.  MEDICATIONS: has a current medication list which includes the following prescription(s): calcium & magnesium carbonates, cholecalciferol, coenzyme q10, cyanocobalamin, enalapril, fish oil-omega-3 fatty acids, glucosamine-chondroitin, pravastatin, ranitidine, saw palmetto, and gabapentin.  SURGICAL HISTORY:  Past Surgical History  Procedure Laterality Date  . Left eye surgery  2013    REVIEW OF SYSTEMS:   Constitutional: Denies fevers, chills or abnormal weight loss Eyes: Denies blurriness of vision Ears, nose, mouth, throat, and face: Denies mucositis or sore throat Respiratory: Denies  cough, dyspnea or wheezes Cardiovascular: Denies palpitation, chest discomfort or lower extremity swelling Gastrointestinal:  Denies nausea, heartburn or change in bowel habits Skin: Denies abnormal skin rashes Lymphatics: Denies new lymphadenopathy or easy bruising Neurological:Denies numbness, tingling or new weaknesses Behavioral/Psych: Mood is stable, no new changes  All other systems were reviewed with the patient and are negative.  PHYSICAL EXAMINATION: ECOG PERFORMANCE STATUS: 0 - Asymptomatic  Blood pressure 133/68, pulse 70, temperature 98 F (36.7 C), temperature source Oral, resp. rate 18, height 5\' 9"  (1.753 m), weight 200 lb 14.4 oz (91.128 kg), SpO2 98.00%.  GENERAL:alert, no distress and comfortable; mildly overweight. SKIN: skin color, texture, turgor are normal, reddish hyperpigmentation around brow and nose; right Port-A-Cath EYES: normal, Conjunctiva are pink and non-injected, sclera clear OROPHARYNX:no exudate, no erythema and lips, buccal mucosa, and tongue normal  NECK: supple, thyroid normal size, non-tender, without nodularity LYMPH:  no palpable lymphadenopathy in the cervical, axillary or supraclavicular LUNGS: clear to auscultation and percussion with normal breathing effort HEART: regular rate & rhythm and no murmurs and no lower extremity edema ABDOMEN:abdomen soft, non-tender and normal bowel sounds Musculoskeletal:no cyanosis of digits and no clubbing  NEURO: alert & oriented x 3 with fluent speech, no focal motor/sensory deficits   Labs:  Lab Results  Component Value Date   WBC 7.2 09/11/2013   HGB 14.8 09/11/2013   HCT 43.4 09/11/2013   MCV 85.6 09/11/2013   PLT 187 09/11/2013   NEUTROABS 4.9 09/11/2013      Chemistry      Component Value Date/Time   NA 140 09/11/2013 0912   NA 138 01/27/2012 0819  NA 139 08/11/2010 1013   K 4.3 09/11/2013 0912   K 4.5 01/27/2012 0819   K 4.3 08/11/2010 1013   CL 104 09/23/2012 1104   CL 99 01/27/2012 0819   CL 102  08/11/2010 1013   CO2 26 09/11/2013 0912   CO2 28 01/27/2012 0819   CO2 26 08/11/2010 1013   BUN 22.6 09/11/2013 0912   BUN 19 01/27/2012 0819   BUN 18 08/11/2010 1013   CREATININE 1.0 09/11/2013 0912   CREATININE 1.1 01/27/2012 0819   CREATININE 1.06 08/11/2010 1013      Component Value Date/Time   CALCIUM 9.4 09/11/2013 0912   CALCIUM 9.0 01/27/2012 0819   CALCIUM 9.3 08/11/2010 1013   ALKPHOS 45 09/11/2013 0912   ALKPHOS 47 01/27/2012 0819   ALKPHOS 47 08/11/2010 1013   AST 7 09/11/2013 0912   AST 19 01/27/2012 0819   AST 15 08/11/2010 1013   ALT 11 09/11/2013 0912   ALT 24 01/27/2012 0819   ALT 25 08/11/2010 1013   BILITOT 0.44 09/11/2013 0912   BILITOT 0.70 01/27/2012 0819   BILITOT 0.6 08/11/2010 1013       No results found for this basename: GLUCAP,  in the last 168 hours Studies:  No results found.   RADIOGRAPHIC STUDIES: Dg Chest 2 View  03/17/2013   *RADIOLOGY REPORT*  Clinical Data: History of lymphoma.  Chest soreness.  CHEST - 2 VIEW  Comparison: CT chest 01/27/2012 and chest radiograph 02/28/2009.  Findings: Trachea is midline.  Heart size normal.  Thoracic aorta is calcified.  Right subclavian Port-A-Cath tip is in the SVC. Minimal scarring or subsegmental atelectasis along the hemidiaphragms bilaterally.  Lungs are mildly hyperinflated but otherwise clear.  No pleural fluid.  IMPRESSION: No acute findings.   Original Report Authenticated By: Lorin Picket, M.D.   Ct Abdomen Pelvis W Contrast  03/17/2013   CLINICAL DATA:  recurrent lymphoma by PET scan on 02/04/2007 with a confirmatory biopsy of the right axillary lymph node on 02/24/2007. This showed follicular lymphoma grade 1-2, CD20 positive. He had involvement of right axilla, retroperitoneum, and pelvic lymph nodes.  EXAM: CT ABDOMEN AND PELVIS WITH CONTRAST  TECHNIQUE: Multidetector CT imaging of the abdomen and pelvis was performed using the standard protocol following bolus administration of intravenous contrast.  CONTRAST:  142mL  OMNIPAQUE IOHEXOL 300 MG/ML  SOLN  COMPARISON:  A 713  FINDINGS: Tiny low-density foci in the posterior right liver are unchanged. The spleen, stomach, duodenum, pancreas, gallbladder, and adrenal glands are unremarkable.  2.1 cm interpolar right renal cyst is stable. Left kidney is unremarkable.  No abdominal aortic aneurysm. The retroperitoneal, left para-aortic lymph nodes persist. 8 mm left para-aortic index node (image 28 today) was 10 mm previously. 6 mm left para-aortic lymph node on image 36 was measured on the previous study at 7 mm. 8 mm hepatoduodenal ligament lymph node seen on image 15 is stable. Small lymph nodes in the gastrohepatic ligament are unchanged.  Imaging through the pelvis shows no intraperitoneal free fluid. Prostate gland is mildly enlarged. 14 mm short axis right common femoral lymph node was 10 mm on the previous study (see images 66 today). The 13 mm short axis right pelvic sidewall lymph node seen on the previous study is now 16 mm in short axis on image 63.  The terminal ileum is normal. The appendix is normal.  Bone windows reveal no worrisome lytic or sclerotic osseous lesions.  IMPRESSION: Stable appearance of the abdominal para-aortic lymph  nodes and hepatoduodenal ligament lymph nodes, but lymph nodes in the right pelvic sidewall and right common femoral region shows slight interval increase in size. PET-CT may prove helpful to assess for hypermetabolism in the right pelvic enlarging nodes.   Electronically Signed   By: Kennith Center M.D.   On: 03/17/2013 10:29   Nm Pet Image Restag (ps) Skull Base To Thigh 04/07/2013   CLINICAL DATA:  Subsequent treatment strategy for lymphoma.  EXAM: NUCLEAR MEDICINE PET SKULL BASE TO THIGH  FASTING BLOOD GLUCOSE:  Value:  99 mg/dl  TECHNIQUE: 72.6 mCi O-03 FDG was injected intravenously. CT data was obtained and used for attenuation correction and anatomic localization only. (This was not acquired as a diagnostic CT examination.)  Additional exam technical data entered on technologist worksheet.  COMPARISON:  Multiple exams, including 03/17/2013 and 02/12/2009  FINDINGS: NECK  No hypermetabolic lymph nodes in the neck.  CHEST  No hypermetabolic mediastinal or hilar nodes. No suspicious pulmonary nodules on the CT data.  ABDOMEN/PELVIS  Right external iliac lymph node, 1.7 cm in short axis, maximum standard uptake value 9.5. Additional adjacent external iliac nodes are present on the right.  In could scattered small periaortic lymph nodes observed, measuring up to 1.3 cm in short axis, maximum standard uptake value 6.9. A right common iliac node with short axis diameter 1.0 cm has a maximum standard uptake value of 6.1.  SKELETON  No focal hypermetabolic activity to suggest skeletal metastasis. Degenerative subcortical cyst formation is present along the acetabular roof bilaterally.  IMPRESSION: 1. Hypermetabolic retroperitoneal and pelvic adenopathy. The most active lymph node is a right external iliac node which has a maximum standard uptake value of 9.5. Appearance favors recurrent lymphoma.   Electronically Signed   By: Herbie Baltimore M.D.   On: 04/07/2013 13:19   09/11/2013  CT ABDOMEN AND PELVIS WITH CONTRAST  TECHNIQUE: (REVIEWED PERSONALLY BY ME) Multidetector CT imaging of the abdomen and pelvis was performed using the standard protocol following bolus administration of intravenous contrast. CONTRAST: OMNIPAQUE IOHEXOL 300 MG/ML SOLN COMPARISON: 03/17/2013 FINDINGS: Subsegmental atelectasis is seen in the right lower lobe. Tiny low-density lesion in the posterior right hepatic dome is unchanged. The spleen, stomach, duodenum, pancreas, gallbladder, and adrenal glands are unremarkable. No substantial change in the 2.2 cm right renal cyst. Left kidney is unremarkable. No abdominal aortic aneurysm. The retroperitoneal lymphadenopathy is again identified. Although subtle, there does appear to have been an interval increase in  the bulkiness/ confluence of this abnormal soft tissue attenuation. A confluence of soft tissue to the left of the aorta on image 37 measures 17 x 26 mm today compared to 15 x 25 mm  when Re measuring in the same dimensions on the previous study. Just to the left of the aortic bifurcation, a soft tissue confluence measures 16 x 21 mm on today's study compared to 13 x 19 mm when remeasuring in the same dimensions on the previous study. 16 mm short axis right pelvic sidewall lymph node measured on the previous study measures 17 mm in short axis on today's exam. 14 mm short axis right inguinal lymph node measured previously is now 14 mm. Small lymph nodes are seen in the left pelvic sidewall. Prostate gland is mildly enlarged. The bladder is unremarkable. Terminal ileum is normal. The appendix is normal. The common iliac veins are stable in appearance since the prior study and also when comparing back to 02/06/2012. Bone windows reveal no worrisome lytic or sclerotic osseous  lesions. IMPRESSION: Apparent slight interval increase and retroperitoneal lymphadenopathy as evidenced by slight increase in size and associated increase in confluency of the para-aortic retroperitoneal abnormal soft tissue. Imaging features are concerning for disease progression. This lymphoid tissue was seen to be hypermetabolic on the previous PET-CT.    ASSESSMENT: Sean Hodges 73 y.o. male with a history of Lymphoma, follicular - Plan: Basic metabolic panel (Bmet) - [facility], Comprehensive metabolic panel (Cmet) - [facility], Lactate dehydrogenase (LDH) - [facility], CBC with Differential  Neuropathy due to chemotherapeutic drug - Plan: gabapentin (NEURONTIN) 300 MG capsule, Basic metabolic panel (Bmet) - [facility]  PLAN:  1. Follicular Lymphoma, slight progression --Sean Hodges continues to do well with no evidence for  progression of his lymphoma clinically. As stated, he is now out nearly 6 years from the time of his last treatment. We reviewed  his CT of abdomen and pelvis consistent with mild progression  . In addition we again reviewed in detail his latest PET CT as noted above.   --We again reviewed in detail NCCN Guidelines version 8.12751 follicular lymphoma grade 1 through 2. We reviewed the GELF criteria (Solal-Celigny P et. [place] [phone]) which indicated that he was negative for involvement of greater than and equal to 3 nodal sites, each with a diameter of greater than and equal to 3 cm; negative for any nodal or extranodal tumor mass with a diameter of greater than or equal to 7 cm; negative for any B. symptoms including night sweats fevers weight loss; negative for splenomegaly; negative for pleural effusions or peritoneal ascites; negative for cytopenias or leukemia.  Given his absence of symptoms, threatened end-organ function, cytopenias secondary to lymphoma, or bulky disease, we will continue to observe. Main risk factor continues to be his age greater than 79 years old.  -- The patient understood the risks of treatment delay including worsening of his lymphadenopathy as characterized by imaging and or worsening of clinical symptoms. The benefit of treatment delay included decrease exposure to chemotherapies which might be myelosuppressive or have other significant side effects. He understood the risks and benefits clearly and chose to continue observation.We then discussed that there was the possibility of histologic transformation in patients with progressive disease especially if they're LDH levels are rising or there is presence of new B. symptoms. He has none of the above at this time.  2. Neuropathy, peripheral secondary chemotherapy. --We will start Gabapentin 300 mg tid (300 mg in morning and 600 mg qhs)  3. Atopic dermatitis, persistent. -- He will continue to assess the risk and benefits or tacrolimus, and alternative therapies with his dermatologist.    4. Follow-up. --We will repeat labs  including chemistries, CBC and LDH in 2 months.  All questions were answered. The patient knows to call the clinic with any problems, questions or concerns. We can certainly see the patient much sooner if necessary. He was provided an after visit summary. He will have his port-a-cath flushed every 8 weeks.  I spent 15 minutes counseling the patient face to face. The total time spent in the appointment was 25 minutes.    Tassie Pollett, MD 09/17/2013 1:31 PM

## 2013-09-20 ENCOUNTER — Telehealth: Payer: Self-pay | Admitting: *Deleted

## 2013-09-20 NOTE — Telephone Encounter (Signed)
Received call from pt wanting to inform Dr. Juliann Mule re:  Pt was prescribed Gabapentin for chemo induced neuropathy.  Pt had taken med for 1 week.  However, pt has had bad side effects from Neurontin -  Nausea, Headache, Dizziness, and Mild Confusion. Pt wanted to let md know that he has stopped taking Neurontin.    Pt has appt for lab on 10/11/13.  Pt wanted to know if he should keep this appt since he stopped taking Neurontin.   Informed pt that Dr. Juliann Mule is out of office until Monday 09/25/13.  Message will be left on md's desk for review.  Pt understood that he will be contacted with further instructions from Dr. Juliann Mule. Pt's  Phone   [phone].

## 2013-10-04 ENCOUNTER — Telehealth: Payer: Self-pay | Admitting: Internal Medicine

## 2013-10-04 NOTE — Telephone Encounter (Signed)
pt called to cx lab...done...did not want to r/s

## 2013-10-11 ENCOUNTER — Other Ambulatory Visit: Payer: Medicare Other

## 2013-11-08 ENCOUNTER — Ambulatory Visit: Payer: Medicare Other

## 2013-11-08 ENCOUNTER — Other Ambulatory Visit (HOSPITAL_BASED_OUTPATIENT_CLINIC_OR_DEPARTMENT_OTHER): Payer: Medicare Other

## 2013-11-08 ENCOUNTER — Telehealth: Payer: Self-pay | Admitting: Internal Medicine

## 2013-11-08 ENCOUNTER — Other Ambulatory Visit: Payer: Medicare Other

## 2013-11-08 ENCOUNTER — Encounter: Payer: Self-pay | Admitting: Internal Medicine

## 2013-11-08 ENCOUNTER — Ambulatory Visit (HOSPITAL_BASED_OUTPATIENT_CLINIC_OR_DEPARTMENT_OTHER): Payer: Medicare Other | Admitting: Internal Medicine

## 2013-11-08 VITALS — BP 128/73 | HR 67 | Temp 98.2°F | Resp 19 | Ht 69.0 in | Wt 202.0 lb

## 2013-11-08 DIAGNOSIS — C8290 Follicular lymphoma, unspecified, unspecified site: Secondary | ICD-10-CM

## 2013-11-08 DIAGNOSIS — L2089 Other atopic dermatitis: Secondary | ICD-10-CM

## 2013-11-08 DIAGNOSIS — C8299 Follicular lymphoma, unspecified, extranodal and solid organ sites: Secondary | ICD-10-CM

## 2013-11-08 DIAGNOSIS — Z95828 Presence of other vascular implants and grafts: Secondary | ICD-10-CM

## 2013-11-08 DIAGNOSIS — G620 Drug-induced polyneuropathy: Secondary | ICD-10-CM

## 2013-11-08 DIAGNOSIS — T451X5A Adverse effect of antineoplastic and immunosuppressive drugs, initial encounter: Secondary | ICD-10-CM

## 2013-11-08 LAB — CBC WITH DIFFERENTIAL/PLATELET
BASO%: 0.9 % (ref 0.0–2.0)
Basophils Absolute: 0.1 10*3/uL (ref 0.0–0.1)
EOS%: 3.2 % (ref 0.0–7.0)
Eosinophils Absolute: 0.2 10*3/uL (ref 0.0–0.5)
HCT: 42.4 % (ref 38.4–49.9)
HGB: 14 g/dL (ref 13.0–17.1)
LYMPH%: 21 % (ref 14.0–49.0)
MCH: 28.5 pg (ref 27.2–33.4)
MCHC: 33 g/dL (ref 32.0–36.0)
MCV: 86.3 fL (ref 79.3–98.0)
MONO#: 0.6 10*3/uL (ref 0.1–0.9)
MONO%: 10.4 % (ref 0.0–14.0)
NEUT#: 3.8 10*3/uL (ref 1.5–6.5)
NEUT%: 64.5 % (ref 39.0–75.0)
Platelets: 172 10*3/uL (ref 140–400)
RBC: 4.92 10*6/uL (ref 4.20–5.82)
RDW: 13.8 % (ref 11.0–14.6)
WBC: 5.9 10*3/uL (ref 4.0–10.3)
lymph#: 1.2 10*3/uL (ref 0.9–3.3)

## 2013-11-08 LAB — COMPREHENSIVE METABOLIC PANEL (CC13)
ALT: 12 U/L (ref 0–55)
AST: 8 U/L (ref 5–34)
Albumin: 3.8 g/dL (ref 3.5–5.0)
Alkaline Phosphatase: 47 U/L (ref 40–150)
Anion Gap: 12 mEq/L — ABNORMAL HIGH (ref 3–11)
BUN: 21.4 mg/dL (ref 7.0–26.0)
CO2: 26 mEq/L (ref 22–29)
Calcium: 9.1 mg/dL (ref 8.4–10.4)
Chloride: 104 mEq/L (ref 98–109)
Creatinine: 1.2 mg/dL (ref 0.7–1.3)
Glucose: 112 mg/dl (ref 70–140)
Potassium: 4.8 mEq/L (ref 3.5–5.1)
Sodium: 142 mEq/L (ref 136–145)
Total Bilirubin: 0.46 mg/dL (ref 0.20–1.20)
Total Protein: 6.5 g/dL (ref 6.4–8.3)

## 2013-11-08 LAB — LACTATE DEHYDROGENASE (CC13): LDH: 147 U/L (ref 125–245)

## 2013-11-08 MED ORDER — HEPARIN SOD (PORK) LOCK FLUSH 100 UNIT/ML IV SOLN
500.0000 [IU] | Freq: Once | INTRAVENOUS | Status: AC
  Administered 2013-11-08: 500 [IU] via INTRAVENOUS
  Filled 2013-11-08: qty 5

## 2013-11-08 MED ORDER — SODIUM CHLORIDE 0.9 % IJ SOLN
10.0000 mL | INTRAMUSCULAR | Status: DC | PRN
  Administered 2013-11-08: 10 mL via INTRAVENOUS
  Filled 2013-11-08: qty 10

## 2013-11-08 NOTE — Patient Instructions (Signed)

## 2013-11-08 NOTE — Progress Notes (Signed)
[facility] OFFICE PROGRESS NOTE  Sean Hodges., MD Sean Hodges  DIAGNOSIS: Lymphoma, follicular  Neuropathy due to chemotherapeutic drug  Chief Complaint  Patient presents with  . Lymphoma, follicular    CURRENT THERAPY: Observation   INTERVAL HISTORY:  Sean Hodges 73 y.o. male with a history of follicular lymphoma presents for follow-up. He was last seen by me on 04/11/2013.  Today, he is accompanied by his wife Sean Hodges.  He denies any fevers or night sweats. He denies any weight changes. He denies any abdominal pain . He reports feeling pretty well. Continues to have his Port-A-Cath flush every 8 weeks. He reports worsening of his tingling in feet bilaterally.  He denies falls.  This bothers him mostly at night. He started to gabapentin 300 mg tid.  He started having headaches and dizziness.  He felt drowsiness so he stopped taking this medication after 4 days.   He reports one bad night of having to change his sheets but otherwise no fevers or cnight sweats.    MEDICAL HISTORY: Past Medical History  Diagnosis Date  . Cancer     nhl    INTERIM HISTORY: has Lymphoma, follicular and Neuropathy due to chemotherapeutic drug on his problem list.    ALLERGIES:  is allergic to gabapentin.  MEDICATIONS: has a current medication list which includes the following prescription(s): calcium & magnesium carbonates, cholecalciferol, coenzyme q10, cyanocobalamin, enalapril, fish oil-omega-3 fatty acids, glucosamine-chondroitin, pravastatin, ranitidine, and saw palmetto.  SURGICAL HISTORY:  Past Surgical History  Procedure Laterality Date  . Left eye surgery  2013    REVIEW OF SYSTEMS:   Constitutional: Denies fevers, chills or abnormal weight loss Eyes: Denies blurriness of vision Ears, nose, mouth, throat, and face: Denies mucositis or sore throat Respiratory: Denies cough, dyspnea or wheezes Cardiovascular: Denies palpitation, chest  discomfort or lower extremity swelling Gastrointestinal:  Denies nausea, heartburn or change in bowel habits Skin: Denies abnormal skin rashes Lymphatics: Denies new lymphadenopathy or easy bruising Neurological:Denies numbness, tingling or new weaknesses Behavioral/Psych: Mood is stable, no new changes  All other systems were reviewed with the patient and are negative.  PHYSICAL EXAMINATION: ECOG PERFORMANCE STATUS: 0 - Asymptomatic  Blood pressure 128/73, pulse 67, temperature 98.2 F (36.8 C), temperature source Oral, resp. rate 19, height 5\' 9"  (1.753 m), weight 202 lb (91.627 kg).  GENERAL:alert, no distress and comfortable; mildly overweight. SKIN: skin color, texture, turgor are normal, reddish hyperpigmentation around brow and nose; right Port-A-Cath EYES: normal, Conjunctiva are pink and non-injected, sclera clear OROPHARYNX:no exudate, no erythema and lips, buccal mucosa, and tongue normal  NECK: supple, thyroid normal size, non-tender, without nodularity LYMPH:  no palpable lymphadenopathy in the cervical, axillary or supraclavicular LUNGS: clear to auscultation and percussion with normal breathing effort HEART: regular rate & rhythm and no murmurs and no lower extremity edema ABDOMEN:abdomen soft, non-tender and normal bowel sounds Musculoskeletal:no cyanosis of digits and no clubbing  NEURO: alert & oriented x 3 with fluent speech, no focal motor/sensory deficits   Labs:  Lab Results  Component Value Date   WBC 5.9 11/08/2013   HGB 14.0 11/08/2013   HCT 42.4 11/08/2013   MCV 86.3 11/08/2013   PLT 172 11/08/2013   NEUTROABS 3.8 11/08/2013      Chemistry      Component Value Date/Time   NA 142 11/08/2013 1202   NA 138 01/27/2012 0819   NA 139 08/11/2010 1013   K 4.8 11/08/2013  1202   K 4.5 01/27/2012 0819   K 4.3 08/11/2010 1013   CL 104 09/23/2012 1104   CL 99 01/27/2012 0819   CL 102 08/11/2010 1013   CO2 26 11/08/2013 1202   CO2 28 01/27/2012 0819   CO2 26 08/11/2010 1013    BUN 21.4 11/08/2013 1202   BUN 19 01/27/2012 0819   BUN 18 08/11/2010 1013   CREATININE 1.2 11/08/2013 1202   CREATININE 1.1 01/27/2012 0819   CREATININE 1.06 08/11/2010 1013      Component Value Date/Time   CALCIUM 9.1 11/08/2013 1202   CALCIUM 9.0 01/27/2012 0819   CALCIUM 9.3 08/11/2010 1013   ALKPHOS 47 11/08/2013 1202   ALKPHOS 47 01/27/2012 0819   ALKPHOS 47 08/11/2010 1013   AST 8 11/08/2013 1202   AST 19 01/27/2012 0819   AST 15 08/11/2010 1013   ALT 12 11/08/2013 1202   ALT 24 01/27/2012 0819   ALT 25 08/11/2010 1013   BILITOT 0.46 11/08/2013 1202   BILITOT 0.70 01/27/2012 0819   BILITOT 0.6 08/11/2010 1013       No results found for this basename: GLUCAP,  in the last 168 hours Studies:  No results found.   RADIOGRAPHIC STUDIES: None  ASSESSMENT: Sean Hodges 72 y.o. male with a history of Lymphoma, follicular  Neuropathy due to chemotherapeutic drug  PLAN:  1. Follicular Lymphoma, slight progression --[name] continues to do well with no evidence for progression of his lymphoma clinically. As stated, he is now out nearly 6 years from the time of his last treatment. We reviewed his CT of abdomen and pelvis consistent with mild progression. In addition we again reviewed in detail his latest PET CT as noted above.   --We again reviewed in detail NCCN Guidelines version 3.1540 follicular lymphoma grade 1 through 2. We reviewed the GELF criteria (Solal-Celigny P et. [place] [phone]) which indicated that he was negative for involvement of greater than and equal to 3 nodal sites, each with a diameter of greater than and equal to 3 cm; negative for any nodal or extranodal tumor mass with a diameter of greater than or equal to 7 cm; negative for any B. symptoms including night sweats fevers weight loss; negative for splenomegaly; negative for pleural effusions or peritoneal ascites; negative for cytopenias or leukemia.  Given his absence of symptoms, threatened  end-organ function, cytopenias secondary to lymphoma, or bulky disease, we will continue to observe. Main risk factor continues to be his age greater than 35 years old.  -- The patient understood the risks of treatment delay including worsening of his lymphadenopathy as characterized by imaging and or worsening of clinical symptoms. The benefit of treatment delay included decrease exposure to chemotherapies which might be myelosuppressive or have other significant side effects. He understood the risks and benefits clearly and chose to continue observation.W e then discussed that there was the possibility of histologic transformation in patients with progressive disease especially if they're LDH levels are rising or there is presence of new B. symptoms. He has none of the above at this time.  2. Neuropathy, peripheral secondary chemotherapy. --We startdc Gabapentin 300 mg tid (300 mg in morning and 600 mg qhs) but he reported drowsiness and other side effects and stopped this medication.   3. Atopic dermatitis, persistent. -- He will continue to assess the risk and benefits or tacrolimus, and alternative therapies with his dermatologist.    4. Follow-up. --We will repeat labs including  chemistries, CBC and LDH in 2 months.  All questions were answered. The patient knows to call the clinic with any problems, questions or concerns. We can certainly see the patient much sooner if necessary. He was provided an after visit summary. He will have his port-a-cath flushed every 8 weeks.  I spent 10 minutes counseling the patient face to face. The total time spent in the appointment was 15 minutes.    Concha Norway, MD 11/08/2013 1:21 PM

## 2013-11-08 NOTE — Telephone Encounter (Signed)
Gave pt appt for lab,md and flush for July 2015

## 2013-11-30 ENCOUNTER — Telehealth: Payer: Self-pay | Admitting: Internal Medicine

## 2013-11-30 NOTE — Telephone Encounter (Signed)
per pt cld & stated wants labs drawn from port/r/s flush after lab-pt understood new lab & flush time

## 2014-01-08 ENCOUNTER — Encounter: Payer: Self-pay | Admitting: Internal Medicine

## 2014-01-08 ENCOUNTER — Ambulatory Visit (HOSPITAL_BASED_OUTPATIENT_CLINIC_OR_DEPARTMENT_OTHER): Payer: Medicare Other

## 2014-01-08 ENCOUNTER — Other Ambulatory Visit (HOSPITAL_BASED_OUTPATIENT_CLINIC_OR_DEPARTMENT_OTHER): Payer: Medicare Other

## 2014-01-08 ENCOUNTER — Other Ambulatory Visit: Payer: Medicare Other

## 2014-01-08 ENCOUNTER — Telehealth: Payer: Self-pay | Admitting: Internal Medicine

## 2014-01-08 ENCOUNTER — Ambulatory Visit (HOSPITAL_BASED_OUTPATIENT_CLINIC_OR_DEPARTMENT_OTHER): Payer: Medicare Other | Admitting: Internal Medicine

## 2014-01-08 VITALS — BP 142/75 | HR 64 | Temp 98.5°F | Resp 18 | Ht 69.0 in | Wt 201.3 lb

## 2014-01-08 VITALS — BP 126/68 | HR 71 | Temp 98.6°F

## 2014-01-08 DIAGNOSIS — C8299 Follicular lymphoma, unspecified, extranodal and solid organ sites: Secondary | ICD-10-CM

## 2014-01-08 DIAGNOSIS — L2089 Other atopic dermatitis: Secondary | ICD-10-CM

## 2014-01-08 DIAGNOSIS — T451X5A Adverse effect of antineoplastic and immunosuppressive drugs, initial encounter: Secondary | ICD-10-CM

## 2014-01-08 DIAGNOSIS — C8290 Follicular lymphoma, unspecified, unspecified site: Secondary | ICD-10-CM

## 2014-01-08 DIAGNOSIS — G620 Drug-induced polyneuropathy: Secondary | ICD-10-CM

## 2014-01-08 DIAGNOSIS — Z95828 Presence of other vascular implants and grafts: Secondary | ICD-10-CM

## 2014-01-08 LAB — COMPREHENSIVE METABOLIC PANEL (CC13)
ALT: 13 U/L (ref 0–55)
AST: 8 U/L (ref 5–34)
Albumin: 3.9 g/dL (ref 3.5–5.0)
Alkaline Phosphatase: 48 U/L (ref 40–150)
Anion Gap: 8 mEq/L (ref 3–11)
BUN: 20.3 mg/dL (ref 7.0–26.0)
CO2: 26 mEq/L (ref 22–29)
Calcium: 9.3 mg/dL (ref 8.4–10.4)
Chloride: 105 mEq/L (ref 98–109)
Creatinine: 1.2 mg/dL (ref 0.7–1.3)
Glucose: 134 mg/dl (ref 70–140)
Potassium: 4.1 mEq/L (ref 3.5–5.1)
Sodium: 140 mEq/L (ref 136–145)
Total Bilirubin: 0.56 mg/dL (ref 0.20–1.20)
Total Protein: 6.5 g/dL (ref 6.4–8.3)

## 2014-01-08 LAB — CBC WITH DIFFERENTIAL/PLATELET
BASO%: 0.8 % (ref 0.0–2.0)
Basophils Absolute: 0 10*3/uL (ref 0.0–0.1)
EOS%: 3 % (ref 0.0–7.0)
Eosinophils Absolute: 0.2 10*3/uL (ref 0.0–0.5)
HCT: 42.3 % (ref 38.4–49.9)
HGB: 14 g/dL (ref 13.0–17.1)
LYMPH%: 19 % (ref 14.0–49.0)
MCH: 28.7 pg (ref 27.2–33.4)
MCHC: 33.1 g/dL (ref 32.0–36.0)
MCV: 86.7 fL (ref 79.3–98.0)
MONO#: 0.6 10*3/uL (ref 0.1–0.9)
MONO%: 8.9 % (ref 0.0–14.0)
NEUT#: 4.3 10*3/uL (ref 1.5–6.5)
NEUT%: 68.3 % (ref 39.0–75.0)
Platelets: 164 10*3/uL (ref 140–400)
RBC: 4.88 10*6/uL (ref 4.20–5.82)
RDW: 14.3 % (ref 11.0–14.6)
WBC: 6.3 10*3/uL (ref 4.0–10.3)
lymph#: 1.2 10*3/uL (ref 0.9–3.3)

## 2014-01-08 LAB — LACTATE DEHYDROGENASE (CC13): LDH: 148 U/L (ref 125–245)

## 2014-01-08 MED ORDER — SODIUM CHLORIDE 0.9 % IJ SOLN
10.0000 mL | INTRAMUSCULAR | Status: DC | PRN
  Administered 2014-01-08: 10 mL via INTRAVENOUS
  Filled 2014-01-08: qty 10

## 2014-01-08 MED ORDER — HEPARIN SOD (PORK) LOCK FLUSH 100 UNIT/ML IV SOLN
500.0000 [IU] | Freq: Once | INTRAVENOUS | Status: AC
  Administered 2014-01-08: 500 [IU] via INTRAVENOUS
  Filled 2014-01-08: qty 5

## 2014-01-08 NOTE — Progress Notes (Signed)
[facility] OFFICE PROGRESS NOTE  Lin Sean Angelique Blonder., MD Sean Hodges [id number]  DIAGNOSIS: Lymphoma, follicular - Plan: CBC with Differential, Comprehensive metabolic panel (Cmet) - [facility], Lactate dehydrogenase (LDH) - [facility]  Chief Complaint  Patient presents with  . Lymphoma, follicular    CURRENT THERAPY: Observation   INTERVAL HISTORY:  Sean Hodges 73 y.o. male with a history of follicular lymphoma presents for follow-up. He was last seen by me on 11/08/2013  Today, he presents alone. He denies any fevers or night sweats. He denies any weight changes. He denies any abdominal pain . He reports feeling pretty well. Continues to have his Port-A-Cath flush every 8 weeks. He reports worsening of his tingling in feet bilaterally.  He denies falls.  This bothers him mostly at night. He complains of back pain with radiation down his left side.   MEDICAL HISTORY: Past Medical History  Diagnosis Date  . Cancer     nhl    INTERIM HISTORY: has Lymphoma, follicular and Neuropathy due to chemotherapeutic drug on his problem list.    ALLERGIES:  is allergic to gabapentin.  MEDICATIONS: has a current medication list which includes the following prescription(s): calcium & magnesium carbonates, cholecalciferol, coenzyme q10, cyanocobalamin, enalapril, fish oil-omega-3 fatty acids, glucosamine-chondroitin, pravastatin, ranitidine, and saw palmetto.  SURGICAL HISTORY:  Past Surgical History  Procedure Laterality Date  . Left eye surgery  2013    REVIEW OF SYSTEMS:   Constitutional: Denies fevers, chills or abnormal weight loss Eyes: Denies blurriness of vision Ears, nose, mouth, throat, and face: Denies mucositis or sore throat Respiratory: Denies cough, dyspnea or wheezes Cardiovascular: Denies palpitation, chest discomfort or lower extremity swelling Gastrointestinal:  Denies nausea, heartburn or change in bowel habits Skin: Denies abnormal skin  rashes Lymphatics: Denies new lymphadenopathy or easy bruising Neurological:Denies numbness, tingling or new weaknesses Behavioral/Psych: Mood is stable, no new changes  All other systems were reviewed with the patient and are negative.  PHYSICAL EXAMINATION: ECOG PERFORMANCE STATUS: 0 - Asymptomatic  Blood pressure 142/75, pulse 64, temperature 98.5 F (36.9 C), temperature source Oral, resp. rate 18, height 5\' 9"  (1.753 m), weight 201 lb 4.8 oz (91.309 kg), SpO2 98.00%.  GENERAL:alert, no distress and comfortable; mildly overweight. SKIN: skin color, texture, turgor are normal, reddish hyperpigmentation around brow and nose; right Port-A-Cath EYES: normal, Conjunctiva are pink and non-injected, sclera clear OROPHARYNX:no exudate, no erythema and lips, buccal mucosa, and tongue normal  NECK: supple, thyroid normal size, non-tender, without nodularity LYMPH:  no palpable lymphadenopathy in the cervical, axillary or supraclavicular LUNGS: clear to auscultation and percussion with normal breathing effort HEART: regular rate & rhythm and no murmurs and no lower extremity edema ABDOMEN:abdomen soft, non-tender and normal bowel sounds Musculoskeletal:no cyanosis of digits and no clubbing  NEURO: alert & oriented x 3 with fluent speech, no focal motor/sensory deficits   Labs:  Lab Results  Component Value Date   WBC 6.3 01/08/2014   HGB 14.0 01/08/2014   HCT 42.3 01/08/2014   MCV 86.7 01/08/2014   PLT 164 01/08/2014   NEUTROABS 4.3 01/08/2014      Chemistry      Component Value Date/Time   NA 140 01/08/2014 1146   NA 138 01/27/2012 0819   NA 139 08/11/2010 1013   K 4.1 01/08/2014 1146   K 4.5 01/27/2012 0819   K 4.3 08/11/2010 1013   CL 104 09/23/2012 1104   CL 99 01/27/2012 0819   CL  102 08/11/2010 1013   CO2 26 01/08/2014 1146   CO2 28 01/27/2012 0819   CO2 26 08/11/2010 1013   BUN 20.3 01/08/2014 1146   BUN 19 01/27/2012 0819   BUN 18 08/11/2010 1013   CREATININE 1.2 01/08/2014 1146    CREATININE 1.1 01/27/2012 0819   CREATININE 1.06 08/11/2010 1013      Component Value Date/Time   CALCIUM 9.3 01/08/2014 1146   CALCIUM 9.0 01/27/2012 0819   CALCIUM 9.3 08/11/2010 1013   ALKPHOS 48 01/08/2014 1146   ALKPHOS 47 01/27/2012 0819   ALKPHOS 47 08/11/2010 1013   AST 8 01/08/2014 1146   AST 19 01/27/2012 0819   AST 15 08/11/2010 1013   ALT 13 01/08/2014 1146   ALT 24 01/27/2012 0819   ALT 25 08/11/2010 1013   BILITOT 0.56 01/08/2014 1146   BILITOT 0.70 01/27/2012 0819   BILITOT 0.6 08/11/2010 1013       No results found for this basename: GLUCAP,  in the last 168 hours Studies:  No results found.   RADIOGRAPHIC STUDIES: None  ASSESSMENT: Sean Hodges 73 y.o. male with a history of Lymphoma, follicular - Plan: CBC with Differential, Comprehensive metabolic panel (Cmet) - [facility], Lactate dehydrogenase (LDH) - [facility]  PLAN:  1. Follicular Lymphoma, slight progression --Mr. Sean Hodges continues to do well with no evidence for progression of his lymphoma clinically. As stated, he is now out 6 years from the time of his last treatment. Last visit, we reviewed his CT of abdomen and pelvis consistent with mild progression.   --We again reviewed in detail NCCN Guidelines version 4.3154 follicular lymphoma grade 1 through 2. We reviewed the GELF criteria (Solal-Celigny P et. [place] [phone]) which indicated that he was negative for involvement of greater than and equal to 3 nodal sites, each with a diameter of greater than and equal to 3 cm; negative for any nodal or extranodal tumor mass with a diameter of greater than or equal to 7 cm; negative for any B. symptoms including night sweats fevers weight loss; negative for splenomegaly; negative for pleural effusions or peritoneal ascites; negative for cytopenias or leukemia.  Given his absence of symptoms, threatened end-organ function, cytopenias secondary to lymphoma, or bulky disease, we will continue to observe. Main risk factor  continues to be his age greater than 59 years old.  -- The patient understood the risks of treatment delay including worsening of his lymphadenopathy as characterized by imaging and or worsening of clinical symptoms. The benefit of treatment delay included decrease exposure to chemotherapies which might be myelosuppressive or have other significant side effects. He understood the risks and benefits clearly and chose to continue observation.We then discussed that there was the possibility of histologic transformation in patients with progressive disease especially if they're LDH levels are rising or there is presence of new B. symptoms. He has none of the above at this time.  2. Back pain, Neuropathy, peripheral secondary chemotherapy. --It appears that he might have sciatica as well as prexisting neuropathy.  We provided him a handout and if pain is persistent, he should follow up with his PCP's office. .   3. Atopic dermatitis, persistent. -- He will continue to assess the risk and benefits or tacrolimus, and alternative therapies with his dermatologist.    4. Follow-up. --We will repeat labs including chemistries, CBC and LDH in 2 months.  All questions were answered. The patient knows to call the clinic with any problems, questions or  concerns. We can certainly see the patient much sooner if necessary. He was provided an after visit summary. He will have his port-a-cath flushed every 8 weeks.  I spent 10 minutes counseling the patient face to face. The total time spent in the appointment was 15 minutes.    Dominica Kent, MD 01/08/2014 2:42 PM

## 2014-01-08 NOTE — Patient Instructions (Signed)

## 2014-01-08 NOTE — Patient Instructions (Signed)
Sciatica Sciatica is pain, weakness, numbness, or tingling along the path of the sciatic nerve. The nerve starts in the lower back and runs down the back of each leg. The nerve controls the muscles in the lower leg and in the back of the knee, while also providing sensation to the back of the thigh, lower leg, and the sole of your foot. Sciatica is a symptom of another medical condition. For instance, nerve damage or certain conditions, such as a herniated disk or bone spur on the spine, pinch or put pressure on the sciatic nerve. This causes the pain, weakness, or other sensations normally associated with sciatica. Generally, sciatica only affects one side of the body. CAUSES   Herniated or slipped disc.  Degenerative disk disease.  A pain disorder involving the narrow muscle in the buttocks (piriformis syndrome).  Pelvic injury or fracture.  Pregnancy.  Tumor (rare). SYMPTOMS  Symptoms can vary from mild to very severe. The symptoms usually travel from the low back to the buttocks and down the back of the leg. Symptoms can include:  Mild tingling or dull aches in the lower back, leg, or hip.  Numbness in the back of the calf or sole of the foot.  Burning sensations in the lower back, leg, or hip.  Sharp pains in the lower back, leg, or hip.  Leg weakness.  Severe back pain inhibiting movement. These symptoms may get worse with coughing, sneezing, laughing, or prolonged sitting or standing. Also, being overweight may worsen symptoms. DIAGNOSIS  Your caregiver will perform a physical exam to look for common symptoms of sciatica. He or she may ask you to do certain movements or activities that would trigger sciatic nerve pain. Other tests may be performed to find the cause of the sciatica. These may include:  Blood tests.  X-rays.  Imaging tests, such as an MRI or CT scan. TREATMENT  Treatment is directed at the cause of the sciatic pain. Sometimes, treatment is not necessary  and the pain and discomfort goes away on its own. If treatment is needed, your caregiver may suggest:  Over-the-counter medicines to relieve pain.  Prescription medicines, such as anti-inflammatory medicine, muscle relaxants, or narcotics.  Applying heat or ice to the painful area.  Steroid injections to lessen pain, irritation, and inflammation around the nerve.  Reducing activity during periods of pain.  Exercising and stretching to strengthen your abdomen and improve flexibility of your spine. Your caregiver may suggest losing weight if the extra weight makes the back pain worse.  Physical therapy.  Surgery to eliminate what is pressing or pinching the nerve, such as a bone spur or part of a herniated disk. HOME CARE INSTRUCTIONS   Only take over-the-counter or prescription medicines for pain or discomfort as directed by your caregiver.  Apply ice to the affected area for 20 minutes, 3-4 times a day for the first 48-72 hours. Then try heat in the same way.  Exercise, stretch, or perform your usual activities if these do not aggravate your pain.  Attend physical therapy sessions as directed by your caregiver.  Keep all follow-up appointments as directed by your caregiver.  Do not wear high heels or shoes that do not provide proper support.  Check your mattress to see if it is too soft. A firm mattress may lessen your pain and discomfort. SEEK IMMEDIATE MEDICAL CARE IF:   You lose control of your bowel or bladder (incontinence).  You have increasing weakness in the lower back, pelvis, buttocks,   or legs.  You have redness or swelling of your back.  You have a burning sensation when you urinate.  You have pain that gets worse when you lie down or awakens you at night.  Your pain is worse than you have experienced in the past.  Your pain is lasting longer than 4 weeks.  You are suddenly losing weight without reason. MAKE SURE YOU:  Understand these  instructions.  Will watch your condition.  Will get help right away if you are not doing well or get worse. Document Released: 06/02/2001 Document Revised: 12/08/2011 Document Reviewed: 10/18/2011 Exit[employer] Patient Information 2015 [employer]. This information is not intended to replace advice given to you by your health care provider. Make sure you discuss any questions you have with your health care provider.  

## 2014-01-08 NOTE — Telephone Encounter (Signed)
Pt confirmed labs/flush/ov per 07/20 POF, gave pt AVS....KJ

## 2014-03-12 ENCOUNTER — Encounter: Payer: Self-pay | Admitting: Hematology

## 2014-03-12 ENCOUNTER — Telehealth: Payer: Self-pay | Admitting: Hematology

## 2014-03-12 ENCOUNTER — Other Ambulatory Visit (HOSPITAL_BASED_OUTPATIENT_CLINIC_OR_DEPARTMENT_OTHER): Payer: Medicare Other

## 2014-03-12 ENCOUNTER — Ambulatory Visit (HOSPITAL_BASED_OUTPATIENT_CLINIC_OR_DEPARTMENT_OTHER): Payer: Medicare Other

## 2014-03-12 ENCOUNTER — Ambulatory Visit (HOSPITAL_BASED_OUTPATIENT_CLINIC_OR_DEPARTMENT_OTHER): Payer: Medicare Other | Admitting: Hematology

## 2014-03-12 VITALS — BP 152/70 | HR 66 | Temp 98.3°F | Resp 18 | Ht 69.0 in | Wt 199.8 lb

## 2014-03-12 DIAGNOSIS — C8299 Follicular lymphoma, unspecified, extranodal and solid organ sites: Secondary | ICD-10-CM

## 2014-03-12 DIAGNOSIS — T451X5A Adverse effect of antineoplastic and immunosuppressive drugs, initial encounter: Secondary | ICD-10-CM

## 2014-03-12 DIAGNOSIS — Z95828 Presence of other vascular implants and grafts: Secondary | ICD-10-CM

## 2014-03-12 DIAGNOSIS — C8290 Follicular lymphoma, unspecified, unspecified site: Secondary | ICD-10-CM

## 2014-03-12 DIAGNOSIS — L2089 Other atopic dermatitis: Secondary | ICD-10-CM

## 2014-03-12 DIAGNOSIS — M549 Dorsalgia, unspecified: Secondary | ICD-10-CM

## 2014-03-12 DIAGNOSIS — G622 Polyneuropathy due to other toxic agents: Secondary | ICD-10-CM

## 2014-03-12 LAB — CBC WITH DIFFERENTIAL/PLATELET
BASO%: 1.2 % (ref 0.0–2.0)
Basophils Absolute: 0.1 10*3/uL (ref 0.0–0.1)
EOS%: 4.2 % (ref 0.0–7.0)
Eosinophils Absolute: 0.3 10*3/uL (ref 0.0–0.5)
HCT: 42.9 % (ref 38.4–49.9)
HGB: 14.4 g/dL (ref 13.0–17.1)
LYMPH%: 19.9 % (ref 14.0–49.0)
MCH: 29.1 pg (ref 27.2–33.4)
MCHC: 33.5 g/dL (ref 32.0–36.0)
MCV: 86.8 fL (ref 79.3–98.0)
MONO#: 0.7 10*3/uL (ref 0.1–0.9)
MONO%: 10.3 % (ref 0.0–14.0)
NEUT#: 4.2 10*3/uL (ref 1.5–6.5)
NEUT%: 64.4 % (ref 39.0–75.0)
Platelets: 178 10*3/uL (ref 140–400)
RBC: 4.95 10*6/uL (ref 4.20–5.82)
RDW: 13.6 % (ref 11.0–14.6)
WBC: 6.5 10*3/uL (ref 4.0–10.3)
lymph#: 1.3 10*3/uL (ref 0.9–3.3)

## 2014-03-12 LAB — COMPREHENSIVE METABOLIC PANEL (CC13)
ALT: 13 U/L (ref 0–55)
AST: 10 U/L (ref 5–34)
Albumin: 3.8 g/dL (ref 3.5–5.0)
Alkaline Phosphatase: 42 U/L (ref 40–150)
Anion Gap: 11 mEq/L (ref 3–11)
BUN: 22 mg/dL (ref 7.0–26.0)
CO2: 26 mEq/L (ref 22–29)
Calcium: 9.2 mg/dL (ref 8.4–10.4)
Chloride: 106 mEq/L (ref 98–109)
Creatinine: 1.1 mg/dL (ref 0.7–1.3)
Glucose: 101 mg/dl (ref 70–140)
Potassium: 4.3 mEq/L (ref 3.5–5.1)
Sodium: 142 mEq/L (ref 136–145)
Total Bilirubin: 0.5 mg/dL (ref 0.20–1.20)
Total Protein: 6.6 g/dL (ref 6.4–8.3)

## 2014-03-12 LAB — LACTATE DEHYDROGENASE (CC13): LDH: 91 U/L — ABNORMAL LOW (ref 125–245)

## 2014-03-12 MED ORDER — SODIUM CHLORIDE 0.9 % IJ SOLN
10.0000 mL | INTRAMUSCULAR | Status: DC | PRN
  Administered 2014-03-12: 10 mL via INTRAVENOUS
  Filled 2014-03-12: qty 10

## 2014-03-12 MED ORDER — HEPARIN SOD (PORK) LOCK FLUSH 100 UNIT/ML IV SOLN
500.0000 [IU] | Freq: Once | INTRAVENOUS | Status: AC
  Administered 2014-03-12: 500 [IU] via INTRAVENOUS
  Filled 2014-03-12: qty 5

## 2014-03-12 NOTE — Patient Instructions (Signed)

## 2014-03-12 NOTE — Telephone Encounter (Signed)
Pt confirmed labs/ov per 09/21 POF, gave pt AVS...Marland KitchenMarland KitchenKJ

## 2014-03-18 ENCOUNTER — Encounter: Payer: Self-pay | Admitting: Hematology

## 2014-03-18 NOTE — Progress Notes (Signed)
[facility] PROGRESS NOTE 03/12/2014  Sean Landsman Angelique Blonder., MD [place] [address]  DIAGNOSIS: Follicular Lymphoma.  Chief Complaint  Patient presents with  . Follow-up    CURRENT THERAPY: Observation   INTERVAL HISTORY:  Sean Hodges 73 y.o. male with a history of follicular lymphoma presents for follow-up. He was last seen by Dr Juliann Mule on 01/08/2014. Today, he presents alone. He denies any fevers or night sweats. He denies any weight changes. He denies any abdominal pain . He reports feeling pretty well. Continues to have his Port-A-Cath flush every 8 weeks. He reports worsening of his tingling in feet bilaterally.  He denies falls.  His night sweats have got worse in 5-6 weeks. He also describes a RLQ discomfort and occasionally gets a right rib pain. He gets acid reflux and takes zantac for it.his immunizations are up to date. He had received a flu shot, pneumonia shot and shingles vaccine. He has history of skin cancer and follows with a dermatologist for skin exams.   His diagnosis of FL was made in 2000 stage IV FL with lymph nodes both above and below the diaphragm. His past therapy included 6 courses of CHOP between July and November 2000. Then got weekly Rituxan x 8 weeks. He stayed in remission until August 2008 when PET/CT showed recurrence and biopsy showed a grade I FL. He received 6 cycles of R-CVP between September 2008 through January 2009. Then he relocated to [place], [place].He was offered Rituxan monotherapy every 2 months x 2 years by Dr Ralene Ok and got 1 dose but then Dr Juliann Mule stopped after 1 dose and plan was to treat him only once he had a recurrence. He stayed in Remission until October 2014. Last 2 scans are consistent with recurrent disease including a PET scan in October 2014 and a CAT scan in March 2015. Now he is getting more symptomatic.   MEDICAL HISTORY: Past Medical History  Diagnosis Date  . Cancer     nhl  .  Hypertension   . Serum cholesterol elevated   . Arthritis     knee osteoarthritis    INTERIM HISTORY: has Lymphoma, follicular and Neuropathy due to chemotherapeutic drug on his problem list.    ALLERGIES:  is allergic to gabapentin.  MEDICATIONS: has a current medication list which includes the following prescription(s): aspirin ec, calcium & magnesium carbonates, cholecalciferol, coenzyme q10, cyanocobalamin, enalapril, fish oil-omega-3 fatty acids, glucosamine-chondroitin, pravastatin, ranitidine, and saw palmetto.  SURGICAL HISTORY:  Past Surgical History  Procedure Laterality Date  . Left eye surgery  2013  . Arthroscopies    . Thyroidectomy, partial Left   . Vocal cord      polyps removed    REVIEW OF SYSTEMS:   Constitutional: Denies fevers, chills or abnormal weight loss,+night sweats Eyes: Denies blurriness of vision Ears, nose, mouth, throat, and face: Denies mucositis or sore throat Respiratory: Denies cough, dyspnea or wheezes Cardiovascular: Denies palpitation, chest discomfort or lower extremity swelling Gastrointestinal:  Denies nausea, heartburn or change in bowel habits Skin: Denies abnormal skin rashes Lymphatics: Denies new lymphadenopathy or easy bruising Neurological:Denies numbness, tingling or new weaknesses Behavioral/Psych: Mood is stable, no new changes  All other systems were reviewed with the patient and are negative.  PHYSICAL EXAMINATION: ECOG PERFORMANCE STATUS: 0  Blood pressure 152/70, pulse 66, temperature 98.3 F (36.8 C), temperature source Oral, resp. rate 18, height 5\' 9"  (1.753 m), weight 199 lb 12.8 oz (90.629 kg).  GENERAL:alert, no distress and comfortable; mildly overweight. SKIN: skin color, texture, turgor are normal, reddish hyperpigmentation around brow and nose; right Port-A-Cath EYES: normal, Conjunctiva are pink and non-injected, sclera clear OROPHARYNX:no exudate, no erythema and lips, buccal mucosa, and tongue normal   NECK: supple, thyroid normal size, non-tender, without nodularity LYMPH:  no palpable lymphadenopathy in the cervical, axillary or supraclavicular LUNGS: clear to auscultation and percussion with normal breathing effort HEART: regular rate & rhythm and no murmurs and no lower extremity edema ABDOMEN:abdomen soft, non-tender and normal bowel sounds Musculoskeletal:no cyanosis of digits and no clubbing  NEURO: alert & oriented x 3 with fluent speech, no focal motor/sensory deficits   Labs:  Lab Results  Component Value Date   WBC 6.5 03/12/2014   HGB 14.4 03/12/2014   HCT 42.9 03/12/2014   MCV 86.8 03/12/2014   PLT 178 03/12/2014   NEUTROABS 4.2 03/12/2014      Chemistry      Component Value Date/Time   NA 142 03/12/2014 0904   NA 138 01/27/2012 0819   NA 139 08/11/2010 1013   K 4.3 03/12/2014 0904   K 4.5 01/27/2012 0819   K 4.3 08/11/2010 1013   CL 104 09/23/2012 1104   CL 99 01/27/2012 0819   CL 102 08/11/2010 1013   CO2 26 03/12/2014 0904   CO2 28 01/27/2012 0819   CO2 26 08/11/2010 1013   BUN 22.0 03/12/2014 0904   BUN 19 01/27/2012 0819   BUN 18 08/11/2010 1013   CREATININE 1.1 03/12/2014 0904   CREATININE 1.1 01/27/2012 0819   CREATININE 1.06 08/11/2010 1013      Component Value Date/Time   CALCIUM 9.2 03/12/2014 0904   CALCIUM 9.0 01/27/2012 0819   CALCIUM 9.3 08/11/2010 1013   ALKPHOS 42 03/12/2014 0904   ALKPHOS 47 01/27/2012 0819   ALKPHOS 47 08/11/2010 1013   AST 10 03/12/2014 0904   AST 19 01/27/2012 0819   AST 15 08/11/2010 1013   ALT 13 03/12/2014 0904   ALT 24 01/27/2012 0819   ALT 25 08/11/2010 1013   BILITOT 0.50 03/12/2014 0904   BILITOT 0.70 01/27/2012 0819   BILITOT 0.6 08/11/2010 1013      RADIOGRAPHIC STUDIES:  CT Abdomen Pelvis w contrast 09/11/2013  FINDINGS:  Subsegmental atelectasis is seen in the right lower lobe. Tiny low-density lesion in the posterior right hepatic dome is unchanged. The spleen, stomach, duodenum, pancreas, gallbladder, and adrenal substantial change in  the 2.2 cm right renal cyst. Left kidney is unremarkable. No abdominal aortic aneurysm. The retroperitoneal lymphadenopathy is  again identified. Although subtle, there does appear to have been an interval increase in the bulkiness/ confluence of this abnormal soft tissue attenuation. A confluence of soft tissue to the left of the aorta on image 37 measures 17 x 26 mm today compared to 15 x 25 mm  when Re measuring in the same dimensions on the previous study. Just to the left of the aortic bifurcation, a soft tissue confluence measures 16 x 21 mm on today's study compared to 13 x 19 mm when remeasuring in the same dimensions on the previous study. 16 mm short axis right pelvic sidewall lymph node measured on the previous study measures 17 mm in short axis on today's exam. 14 mm short axis right inguinal lymph node measured previously is now 14 mm. Small lymph nodes are seen in the left pelvic sidewall. Prostate gland is mildly enlarged. The bladder is unremarkable. Terminal ileum is normal. The  appendix is normal. The common iliac veins are stable in appearance since the prior  study and also when comparing back to 02/06/2012. Bone windows reveal no worrisome lytic or sclerotic osseous lesions. IMPRESSION: Apparent slight interval increase and retroperitoneal lymphadenopathy as evidenced by slight increase in size and associated increase in confluency of the para-aortic retroperitoneal Ab normal soft tissue. Imaging features are concerning for disease progression. This lymphoid tissue was seen to be hypermetabolic on the previous PET-CT.  ASSESSMENT: Sean Hodges 73 y.o. male with a history of Follicular Lymphoma off and on therapy as detailed above and now clinically suggesting more symptomatic from disease.   PLAN:  1. Follicular Lymphoma, slight progression --Mr. Frisbee is experiencing some constitutional symptoms. As stated, he is now out 6 years from the time of his last treatment. Last visit, we  reviewed his CT of abdomen and pelvis consistent with mild progression.   --We again reviewed in detail NCCN Guidelines version 1.2197 follicular lymphoma grade 1 through 2. We reviewed the GELF criteria (Solal-Celigny P et. [place] [phone]) which indicated that he was negative for involvement of greater than and equal to 3 nodal sites, each with a diameter of greater than and equal to 3 cm; negative for any nodal or extranodal tumor mass with a diameter of greater than or equal to 7 cm; positive for any B. symptoms including night sweats fevers weight loss; negative for splenomegaly; negative for pleural effusions or peritoneal ascites; negative for cytopenias or leukemia.  I am ordering another PET scan now for restaging as I am planning to start him back on systemic therapy. This scan will serve as a baseline to determine how much lymphadenopathy he has and what is the PET activity at those sites so we can later document a treatment response post-completion of therapy by doing another PET scan. --We can consider using Rituxan Bendamustine for him if he has progressive disease as it is very well tolerated.  2. Back pain, Neuropathy, peripheral secondary chemotherapy. --It appears that has prexisting neuropathy from prior chemotherapy.   3. Atopic dermatitis, persistent. -- He will continue to assess the risk and benefits or tacrolimus, and alternative therapies with his dermatologist.    4. Follow-up. --We will order his PET scan on 03/26/14 and follow up on 04/02/2014.  All questions were answered. The patient knows to call the clinic with any problems, questions or concerns. We can certainly see the patient much sooner if necessary. He was provided an after visit summary. He will have his port-a-cath flushed every 8 weeks.  I spent 20 minutes counseling the patient face to face. The total time spent in the appointment was 25 minutes.    Bernadene Bell, MD Medical  Hematologist/Oncologist [facility] Pager: [phone] Office No: [phone]

## 2014-03-29 ENCOUNTER — Encounter (HOSPITAL_COMMUNITY)
Admission: RE | Admit: 2014-03-29 | Discharge: 2014-03-29 | Disposition: A | Discharge status: Home / Self Care | Payer: Medicare Other | Source: Ambulatory Visit | Attending: Hematology | Admitting: Hematology

## 2014-03-29 DIAGNOSIS — C8290 Follicular lymphoma, unspecified, unspecified site: Secondary | ICD-10-CM

## 2014-03-29 LAB — GLUCOSE, CAPILLARY: Glucose-Capillary: 103 mg/dL — ABNORMAL HIGH (ref 70–99)

## 2014-03-29 MED ORDER — FLUDEOXYGLUCOSE F - 18 (FDG) INJECTION
9.9000 | Freq: Once | INTRAVENOUS | Status: AC | PRN
  Administered 2014-03-29: 9.9 via INTRAVENOUS

## 2014-04-02 ENCOUNTER — Ambulatory Visit: Payer: Medicare Other

## 2014-04-02 ENCOUNTER — Other Ambulatory Visit: Payer: Medicare Other

## 2014-04-05 ENCOUNTER — Telehealth: Payer: Self-pay | Admitting: Hematology

## 2014-04-05 ENCOUNTER — Other Ambulatory Visit (HOSPITAL_BASED_OUTPATIENT_CLINIC_OR_DEPARTMENT_OTHER): Payer: Medicare Other

## 2014-04-05 ENCOUNTER — Ambulatory Visit (HOSPITAL_BASED_OUTPATIENT_CLINIC_OR_DEPARTMENT_OTHER): Payer: Medicare Other | Admitting: Hematology

## 2014-04-05 VITALS — BP 138/66 | HR 65 | Temp 98.0°F | Resp 18 | Ht 69.0 in | Wt 203.6 lb

## 2014-04-05 DIAGNOSIS — C8290 Follicular lymphoma, unspecified, unspecified site: Secondary | ICD-10-CM

## 2014-04-05 DIAGNOSIS — L209 Atopic dermatitis, unspecified: Secondary | ICD-10-CM

## 2014-04-05 DIAGNOSIS — M549 Dorsalgia, unspecified: Secondary | ICD-10-CM

## 2014-04-05 LAB — COMPREHENSIVE METABOLIC PANEL (CC13)
ALT: 13 U/L (ref 0–55)
AST: 10 U/L (ref 5–34)
Albumin: 3.9 g/dL (ref 3.5–5.0)
Alkaline Phosphatase: 51 U/L (ref 40–150)
Anion Gap: 8 mEq/L (ref 3–11)
BUN: 21.6 mg/dL (ref 7.0–26.0)
CO2: 28 mEq/L (ref 22–29)
Calcium: 9.5 mg/dL (ref 8.4–10.4)
Chloride: 106 mEq/L (ref 98–109)
Creatinine: 1.3 mg/dL (ref 0.7–1.3)
Glucose: 105 mg/dl (ref 70–140)
Potassium: 4.6 mEq/L (ref 3.5–5.1)
Sodium: 142 mEq/L (ref 136–145)
Total Bilirubin: 0.48 mg/dL (ref 0.20–1.20)
Total Protein: 6.6 g/dL (ref 6.4–8.3)

## 2014-04-05 LAB — CBC WITH DIFFERENTIAL/PLATELET
BASO%: 1.3 % (ref 0.0–2.0)
Basophils Absolute: 0.1 10*3/uL (ref 0.0–0.1)
EOS%: 4.3 % (ref 0.0–7.0)
Eosinophils Absolute: 0.2 10*3/uL (ref 0.0–0.5)
HCT: 43.5 % (ref 38.4–49.9)
HGB: 14.5 g/dL (ref 13.0–17.1)
LYMPH%: 23 % (ref 14.0–49.0)
MCH: 29.2 pg (ref 27.2–33.4)
MCHC: 33.3 g/dL (ref 32.0–36.0)
MCV: 87.9 fL (ref 79.3–98.0)
MONO#: 0.6 10*3/uL (ref 0.1–0.9)
MONO%: 10.4 % (ref 0.0–14.0)
NEUT#: 3.5 10*3/uL (ref 1.5–6.5)
NEUT%: 61 % (ref 39.0–75.0)
Platelets: 175 10*3/uL (ref 140–400)
RBC: 4.95 10*6/uL (ref 4.20–5.82)
RDW: 14 % (ref 11.0–14.6)
WBC: 5.8 10*3/uL (ref 4.0–10.3)
lymph#: 1.3 10*3/uL (ref 0.9–3.3)

## 2014-04-05 LAB — LACTATE DEHYDROGENASE (CC13): LDH: 148 U/L (ref 125–245)

## 2014-04-05 NOTE — Progress Notes (Signed)
[facility] ONCOLOGY OFFICE PROGRESS NOTE DATE OF VISIT: 04/05/2014.   Sean Hodges., MD [name]  DIAGNOSIS: Follicular Lymphoma.  Chief Complaint  Patient presents with  . Follow-up    CURRENT THERAPY: Observation and patient here to discuss results of a restaging PET scan  INTERVAL HISTORY:  Sean Hodges 73 y.o. male with a history of follicular lymphoma presents for follow-up. He was last seen by me on 03/12/14. Today, he came with his wife.  He denies any fevers.  He denies any weight changes. He denies any abdominal pain . He reports feeling pretty well. Continues to have his Port-A-Cath flush every 8 weeks. He reports worsening of his tingling in feet bilaterally.  He denies falls.  His night sweats have got worse in 5-6 weeks. He also describes a RLQ discomfort and occasionally gets a right rib pain. He gets acid reflux and takes zantac for it.his immunizations are up to date. He had received a flu shot, pneumonia shot and shingles vaccine. He has history of skin cancer and follows with a dermatologist for skin exams.   His diagnosis of FL was made in 2000 stage IV FL with lymph nodes both above and below the diaphragm. His past therapy included 6 courses of CHOP between July and November 2000. Then got weekly Rituxan x 8 weeks. He stayed in remission until August 2008 when PET/CT showed recurrence and biopsy showed a grade I FL. He received 6 cycles of R-CVP between September 2008 through January 2009. Then he relocated to [place], [place].He was offered Rituxan monotherapy every 2 months x 2 years by Dr Ralene Ok and got 1 dose but then Dr Juliann Mule stopped after 1 dose and plan was to treat him only once he had a recurrence. He stayed in Remission until October 2014. Last 2 scans are consistent with recurrent disease including a PET scan in October 2014 and a CAT scan in March 2015. Now he is getting more symptomatic.  Patient had a recent  pet scan done on 03/29/14 which shows:  FINDINGS:  NECK  No hypermetabolic lymph nodes in the neck.  CHEST  Fatty but faintly hypermetabolic axillary lymph nodes are present. An index right axillary lymph node measures 0.8 cm in short axis and  has a maximum standard uptake value of 3.3.  ABDOMEN/PELVIS  Pathologic hypermetabolic retroperitoneal adenopathy noted. An index left periaortic lymph node measures 1.6 cm on image 143 (formerly  1.3 cm), and has a maximum standard uptake value of 4.9 (essentially stable in activity). Bilateral hypermetabolic pelvic adenopathy is  observed, with a right external iliac lymph node measuring 2.2 cm in short axis (previously 1.7 cm) and with a maximum standard uptake  value of 7.1 (formerly 9.5).  SKELETON  No focal hypermetabolic activity to suggest skeletal metastasis.  IMPRESSION:  1. Mixed appearance of lymphoma with retroperitoneal and pelvic lymph nodes mildly larger but with stable to mildly reduced activity  compared to the prior exam. 2. Bilateral axillary lymph nodes appear very thin and with fatty hila, but have faintly increased metabolic activity potentially reflecting lymphomatous involvement.  Electronically Signed By: Sherryl Barters M.D. On: 03/29/2014 11:31           MEDICAL HISTORY: Past Medical History  Diagnosis Date  . Cancer     nhl  . Hypertension   . Serum cholesterol elevated   . Arthritis     knee osteoarthritis    INTERIM HISTORY: has Lymphoma,  follicular and Neuropathy due to chemotherapeutic drug on his problem list.    ALLERGIES:  is allergic to gabapentin.  MEDICATIONS: has a current medication list which includes the following prescription(s): aspirin ec, calcium & magnesium carbonates, cholecalciferol, coenzyme q10, cyanocobalamin, enalapril, fish oil-omega-3 fatty acids, glucosamine-chondroitin, pravastatin, ranitidine, and saw palmetto.  SURGICAL HISTORY:  Past Surgical History  Procedure  Laterality Date  . Left eye surgery  2013  . Arthroscopies    . Thyroidectomy, partial Left   . Vocal cord      polyps removed    REVIEW OF SYSTEMS:   Constitutional: Denies fevers, chills or abnormal weight loss,+night sweats Eyes: Denies blurriness of vision Ears, nose, mouth, throat, and face: Denies mucositis or sore throat Respiratory: Denies cough, dyspnea or wheezes Cardiovascular: Denies palpitation, chest discomfort or lower extremity swelling Gastrointestinal:  Denies nausea, heartburn or change in bowel habits Skin: Denies abnormal skin rashes Lymphatics: Denies new lymphadenopathy or easy bruising Neurological:Denies numbness, tingling or new weaknesses Behavioral/Psych: Mood is stable, no new changes  All other systems were reviewed with the patient and are negative.  PHYSICAL EXAMINATION: ECOG PERFORMANCE STATUS: 0  Blood pressure 138/66, pulse 65, temperature 98 F (36.7 C), temperature source Oral, resp. rate 18, height 5\' 9"  (1.753 m), weight 203 lb 9.6 oz (92.352 kg), SpO2 98.00%.  GENERAL:alert, no distress and comfortable; mildly overweight. SKIN: skin color, texture, turgor are normal, reddish hyperpigmentation around brow and nose; right Port-A-Cath EYES: normal, Conjunctiva are pink and non-injected, sclera clear OROPHARYNX:no exudate, no erythema and lips, buccal mucosa, and tongue normal  NECK: supple, thyroid normal size, non-tender, without nodularity LYMPH:  no palpable lymphadenopathy in the cervical, axillary or supraclavicular LUNGS: clear to auscultation and percussion with normal breathing effort HEART: regular rate & rhythm and no murmurs and no lower extremity edema ABDOMEN:abdomen soft, non-tender and normal bowel sounds Musculoskeletal:no cyanosis of digits and no clubbing  NEURO: alert & oriented x 3 with fluent speech, no focal motor/sensory deficits   Labs:  Lab Results  Component Value Date   WBC 5.8 04/05/2014   HGB 14.5 04/05/2014    HCT 43.5 04/05/2014   MCV 87.9 04/05/2014   PLT 175 04/05/2014   NEUTROABS 3.5 04/05/2014      Chemistry      Component Value Date/Time   NA 142 04/05/2014 0843   NA 138 01/27/2012 0819   NA 139 08/11/2010 1013   K 4.6 04/05/2014 0843   K 4.5 01/27/2012 0819   K 4.3 08/11/2010 1013   CL 104 09/23/2012 1104   CL 99 01/27/2012 0819   CL 102 08/11/2010 1013   CO2 28 04/05/2014 0843   CO2 28 01/27/2012 0819   CO2 26 08/11/2010 1013   BUN 21.6 04/05/2014 0843   BUN 19 01/27/2012 0819   BUN 18 08/11/2010 1013   CREATININE 1.3 04/05/2014 0843   CREATININE 1.1 01/27/2012 0819   CREATININE 1.06 08/11/2010 1013      Component Value Date/Time   CALCIUM 9.5 04/05/2014 0843   CALCIUM 9.0 01/27/2012 0819   CALCIUM 9.3 08/11/2010 1013   ALKPHOS 51 04/05/2014 0843   ALKPHOS 47 01/27/2012 0819   ALKPHOS 47 08/11/2010 1013   AST 10 04/05/2014 0843   AST 19 01/27/2012 0819   AST 15 08/11/2010 1013   ALT 13 04/05/2014 0843   ALT 24 01/27/2012 0819   ALT 25 08/11/2010 1013   BILITOT 0.48 04/05/2014 0843   BILITOT 0.70 01/27/2012 0819   BILITOT 0.6  08/11/2010 1013      RADIOGRAPHIC STUDIES:  CT Abdomen Pelvis w contrast 09/11/2013  FINDINGS:  Subsegmental atelectasis is seen in the right lower lobe. Tiny low-density lesion in the posterior right hepatic dome is unchanged. The spleen, stomach, duodenum, pancreas, gallbladder, and adrenal substantial change in the 2.2 cm right renal cyst. Left kidney is unremarkable. No abdominal aortic aneurysm. The retroperitoneal lymphadenopathy is  again identified. Although subtle, there does appear to have been an interval increase in the bulkiness/ confluence of this abnormal soft tissue attenuation. A confluence of soft tissue to the left of the aorta on image 37 measures 17 x 26 mm today compared to 15 x 25 mm  when Re measuring in the same dimensions on the previous study. Just to the left of the aortic bifurcation, a soft tissue confluence measures 16 x 21 mm on today's study  compared to 13 x 19 mm when remeasuring in the same dimensions on the previous study. 16 mm short axis right pelvic sidewall lymph node measured on the previous study measures 17 mm in short axis on today's exam. 14 mm short axis right inguinal lymph node measured previously is now 14 mm. Small lymph nodes are seen in the left pelvic sidewall. Prostate gland is mildly enlarged. The bladder is unremarkable. Terminal ileum is normal. The appendix is normal. The common iliac veins are stable in appearance since the prior  study and also when comparing back to 02/06/2012. Bone windows reveal no worrisome lytic or sclerotic osseous lesions. IMPRESSION: Apparent slight interval increase and retroperitoneal lymphadenopathy as evidenced by slight increase in size and associated increase in confluency of the para-aortic retroperitoneal Ab normal soft tissue. Imaging features are concerning for disease progression. This lymphoid tissue was seen to be hypermetabolic on the previous PET-CT.  ASSESSMENT: Sean Hodges 73 y.o. male with a history of Follicular Lymphoma off and on therapy as detailed above and now clinically suggesting more symptomatic from disease.   PLAN:  1. Follicular Lymphoma, slight progression --Mr. Yu is experiencing some constitutional symptoms. As stated, he is now out 6 years from the time of his last treatment. Last visit, we reviewed his CT of abdomen and pelvis consistent with mild progression. Today we reviewed his pet scan results. Since things are stable with mixed response, a mutual decision was reached to continue observation at this time. F/u 3 months with labs and another scan in 6 months.  --We again reviewed in detail NCCN Guidelines version 5.1884 follicular lymphoma grade 1 through 2. We reviewed the GELF criteria (Solal-Celigny P et. [place] [phone]) which indicated that he was negative for involvement of greater than and equal to 3 nodal sites, each  with a diameter of greater than and equal to 3 cm; negative for any nodal or extranodal tumor mass with a diameter of greater than or equal to 7 cm; positive for any B. symptoms including night sweats fevers weight loss; negative for splenomegaly; negative for pleural effusions or peritoneal ascites; negative for cytopenias or leukemia.  I am ordering another PET scan now for restaging as I am planning to start him back on systemic therapy. This scan will serve as a baseline to determine how much lymphadenopathy he has and what is the PET activity at those sites so we can later document a treatment response post-completion of therapy by doing another PET scan. --We can consider using Rituxan Bendamustine for him if he has progressive disease as it is very well  tolerated.  2. Back pain, Neuropathy, peripheral secondary chemotherapy. --It appears that has prexisting neuropathy from prior chemotherapy.   3. Atopic dermatitis, persistent. -- He will continue to assess the risk and benefits or tacrolimus, and alternative therapies with his dermatologist.    4. Follow-up. --We will order his labs and follow up in 3 months.  All questions were answered. The patient knows to call the clinic with any problems, questions or concerns. We can certainly see the patient much sooner if necessary. He was provided an after visit summary. He will have his port-a-cath flushed every 8 weeks.  I spent 20 minutes counseling the patient face to face. The total time spent in the appointment was 25 minutes.    Bernadene Bell, MD Medical Hematologist/Oncologist [place] Pager: [phone] Office No: [phone]

## 2014-04-05 NOTE — Telephone Encounter (Signed)
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

## 2014-05-07 ENCOUNTER — Ambulatory Visit (HOSPITAL_BASED_OUTPATIENT_CLINIC_OR_DEPARTMENT_OTHER): Payer: Medicare Other

## 2014-05-07 VITALS — BP 122/72 | HR 68 | Temp 98.2°F

## 2014-05-07 DIAGNOSIS — Z452 Encounter for adjustment and management of vascular access device: Secondary | ICD-10-CM

## 2014-05-07 DIAGNOSIS — Z95828 Presence of other vascular implants and grafts: Secondary | ICD-10-CM

## 2014-05-07 DIAGNOSIS — C8290 Follicular lymphoma, unspecified, unspecified site: Secondary | ICD-10-CM

## 2014-05-07 MED ORDER — SODIUM CHLORIDE 0.9 % IJ SOLN
10.0000 mL | INTRAMUSCULAR | Status: DC | PRN
  Administered 2014-05-07: 10 mL via INTRAVENOUS
  Filled 2014-05-07: qty 10

## 2014-05-07 MED ORDER — HEPARIN SOD (PORK) LOCK FLUSH 100 UNIT/ML IV SOLN
500.0000 [IU] | Freq: Once | INTRAVENOUS | Status: AC
  Administered 2014-05-07: 500 [IU] via INTRAVENOUS
  Filled 2014-05-07: qty 5

## 2014-05-07 NOTE — Patient Instructions (Signed)

## 2014-05-14 ENCOUNTER — Other Ambulatory Visit: Payer: Medicare Other

## 2014-06-12 ENCOUNTER — Telehealth: Payer: Self-pay | Admitting: Oncology

## 2014-07-02 ENCOUNTER — Other Ambulatory Visit (HOSPITAL_BASED_OUTPATIENT_CLINIC_OR_DEPARTMENT_OTHER): Payer: Medicare Other

## 2014-07-02 ENCOUNTER — Other Ambulatory Visit: Payer: Medicare Other

## 2014-07-02 ENCOUNTER — Ambulatory Visit: Payer: Medicare Other

## 2014-07-02 VITALS — BP 142/69 | HR 70 | Temp 98.0°F

## 2014-07-02 DIAGNOSIS — Z95828 Presence of other vascular implants and grafts: Secondary | ICD-10-CM

## 2014-07-02 DIAGNOSIS — C8290 Follicular lymphoma, unspecified, unspecified site: Secondary | ICD-10-CM

## 2014-07-02 LAB — CBC WITH DIFFERENTIAL/PLATELET
BASO%: 1 % (ref 0.0–2.0)
Basophils Absolute: 0.1 10*3/uL (ref 0.0–0.1)
EOS%: 2.3 % (ref 0.0–7.0)
Eosinophils Absolute: 0.2 10*3/uL (ref 0.0–0.5)
HCT: 45 % (ref 38.4–49.9)
HGB: 15.1 g/dL (ref 13.0–17.1)
LYMPH%: 18.3 % (ref 14.0–49.0)
MCH: 28.6 pg (ref 27.2–33.4)
MCHC: 33.5 g/dL (ref 32.0–36.0)
MCV: 85.5 fL (ref 79.3–98.0)
MONO#: 0.8 10*3/uL (ref 0.1–0.9)
MONO%: 11.9 % (ref 0.0–14.0)
NEUT#: 4.7 10*3/uL (ref 1.5–6.5)
NEUT%: 66.5 % (ref 39.0–75.0)
Platelets: 180 10*3/uL (ref 140–400)
RBC: 5.27 10*6/uL (ref 4.20–5.82)
RDW: 14.2 % (ref 11.0–14.6)
WBC: 7 10*3/uL (ref 4.0–10.3)
lymph#: 1.3 10*3/uL (ref 0.9–3.3)

## 2014-07-02 LAB — COMPREHENSIVE METABOLIC PANEL (CC13)
ALT: 11 U/L (ref 0–55)
AST: 11 U/L (ref 5–34)
Albumin: 4.2 g/dL (ref 3.5–5.0)
Alkaline Phosphatase: 45 U/L (ref 40–150)
Anion Gap: 7 mEq/L (ref 3–11)
BUN: 20.2 mg/dL (ref 7.0–26.0)
CO2: 28 mEq/L (ref 22–29)
Calcium: 9.3 mg/dL (ref 8.4–10.4)
Chloride: 105 mEq/L (ref 98–109)
Creatinine: 1.2 mg/dL (ref 0.7–1.3)
EGFR: 62 mL/min/{1.73_m2} — ABNORMAL LOW (ref >90)
Glucose: 99 mg/dl (ref 70–140)
Potassium: 4.3 mEq/L (ref 3.5–5.1)
Sodium: 139 mEq/L (ref 136–145)
Total Bilirubin: 0.59 mg/dL (ref 0.20–1.20)
Total Protein: 6.8 g/dL (ref 6.4–8.3)

## 2014-07-02 LAB — LACTATE DEHYDROGENASE (CC13): LDH: 165 U/L (ref 125–245)

## 2014-07-02 MED ORDER — SODIUM CHLORIDE 0.9 % IJ SOLN
10.0000 mL | INTRAMUSCULAR | Status: DC | PRN
  Administered 2014-07-02: 10 mL via INTRAVENOUS
  Filled 2014-07-02: qty 10

## 2014-07-02 MED ORDER — HEPARIN SOD (PORK) LOCK FLUSH 100 UNIT/ML IV SOLN
500.0000 [IU] | Freq: Once | INTRAVENOUS | Status: AC
  Administered 2014-07-02: 500 [IU] via INTRAVENOUS
  Filled 2014-07-02: qty 5

## 2014-07-02 NOTE — Patient Instructions (Signed)

## 2014-07-11 ENCOUNTER — Telehealth: Payer: Self-pay | Admitting: Oncology

## 2014-07-11 ENCOUNTER — Ambulatory Visit (HOSPITAL_BASED_OUTPATIENT_CLINIC_OR_DEPARTMENT_OTHER): Payer: Medicare Other | Admitting: Oncology

## 2014-07-11 VITALS — BP 145/74 | HR 71 | Temp 98.0°F | Resp 18 | Ht 69.0 in | Wt 199.1 lb

## 2014-07-11 DIAGNOSIS — C8290 Follicular lymphoma, unspecified, unspecified site: Secondary | ICD-10-CM

## 2014-07-11 DIAGNOSIS — R198 Other specified symptoms and signs involving the digestive system and abdomen: Secondary | ICD-10-CM

## 2014-07-11 DIAGNOSIS — G620 Drug-induced polyneuropathy: Secondary | ICD-10-CM

## 2014-07-11 DIAGNOSIS — M549 Dorsalgia, unspecified: Secondary | ICD-10-CM

## 2014-07-11 NOTE — Progress Notes (Signed)
[facility] ONCOLOGY OFFICE PROGRESS NOTE DATE OF VISIT: 04/05/2014.   Sean Sheriff., MD [place] [id number]  DIAGNOSIS: 74 year old gentleman Follicular Lymphoma. He was initially diagnosed in June 2000 after presenting with thyroid nodule. He is status post multiple therapies since that time.  PAST THERAPY:  His past therapy included 6 courses of CHOP between July and November 2000.  Then got weekly Rituxan x 8 weeks. He stayed in remission until August 2008 when PET/CT showed recurrence and biopsy showed a grade I FL.  He received 6 cycles of R-CVP between September 2008 through January 2009.  Then he relocated to [place], [place].He was offered Rituxan monotherapy every 2 months x 2 years by Dr Ralene Ok and got 1 dose but then Dr Juliann Mule stopped after 1 dose and plan was to treat him only once he had a recurrence.  He stayed in Remission until October 2014. Last 2 scans are consistent with recurrent disease including a PET scan in October 2014 and a CAT scan in March 2015.    CURRENT THERAPY: Observation and surveillance.  INTERVAL HISTORY:  Sean Hodges 63  presents for follow-up visit. Since the last visit, he continues to do relatively well. He does have some occasional night sweats but for the most part have been very manageable. He denies any fevers.  He denies any weight changes. He denies any abdominal pain . He reports feeling pretty well. He continues to perform activities of daily living without any hindrance or decline. He does not report any other constitutional symptoms.  He does not report any headaches, blurry vision, syncope or seizures. He does not report chest pain, palpitation, leg edema. He does not report any shortness of breath, cough or hemoptysis or hematemesis. Does not report any nausea, vomiting, abdominal pain, hematochezia, melena. Does not report any neurological deficits. She does not report any lymphadenopathy, petechiae or  bleeding. Rest of his review of systems unremarkable.     MEDICAL HISTORY: Past Medical History  Diagnosis Date  . Cancer     nhl  . Hypertension   . Serum cholesterol elevated   . Arthritis     knee osteoarthritis    I ALLERGIES:  is allergic to gabapentin.  MEDICATIONS:   Current Outpatient Prescriptions  Medication Sig Dispense Refill  . aspirin EC 81 MG tablet Take 81 mg by mouth daily.    . calcium & magnesium carbonates (MYLANTA) 311-232 MG per tablet Take 1 tablet by mouth 2 (two) times daily.    . cholecalciferol (VITAMIN D) 400 UNITS TABS Take 1,000 Units by mouth. 500 units daily    . COENZYME Q-10 PO Take by mouth.    . cyanocobalamin 1000 MCG tablet Take 100 mcg by mouth daily.    . enalapril (VASOTEC) 20 MG tablet Take 20 mg by mouth daily.     . fish oil-omega-3 fatty acids 1000 MG capsule Take 1 g by mouth daily.    Marland Kitchen glucosamine-chondroitin 500-400 MG tablet Take 1 tablet by mouth 2 (two) times daily.    . pravastatin (PRAVACHOL) 40 MG tablet Take 40 mg by mouth daily.    . ranitidine (ZANTAC) 150 MG capsule Take 150 mg by mouth every evening.    . saw palmetto 500 MG capsule Take 500 mg by mouth 2 (two) times daily.     No current facility-administered medications for this visit.     SURGICAL HISTORY:  Past Surgical History  Procedure Laterality Date  .  Left eye surgery  2013  . Arthroscopies    . Thyroidectomy, partial Left   . Vocal cord      polyps removed    PHYSICAL EXAMINATION: ECOG PERFORMANCE STATUS: 0  Blood pressure 145/74, pulse 71, temperature 98 F (36.7 C), temperature source Oral, resp. rate 18, height 5\' 9"  (1.753 m), weight 199 lb 1.6 oz (90.311 kg).  GENERAL:alert, no distress and comfortable; mildly overweight. SKIN: skin color, texture, turgor are normal. right Port-A-Cath EYES: normal, Conjunctiva are pink and non-injected, sclera clear OROPHARYNX:no exudate, no erythema and lips, buccal mucosa, and tongue normal  NECK:  supple, thyroid normal size, non-tender, without nodularity LYMPH:  no palpable lymphadenopathy in the cervical, axillary, inguinal or supraclavicular LUNGS: clear to auscultation and percussion with normal breathing effort HEART: regular rate & rhythm and no murmurs and no lower extremity edema ABDOMEN:abdomen soft, non-tender and normal bowel sounds Musculoskeletal:no cyanosis of digits and no clubbing  NEURO: alert & oriented x 3 with fluent speech, no focal motor/sensory deficits   Labs:  Lab Results  Component Value Date   WBC 7.0 07/02/2014   HGB 15.1 07/02/2014   HCT 45.0 07/02/2014   MCV 85.5 07/02/2014   PLT 180 07/02/2014   NEUTROABS 4.7 07/02/2014      Chemistry      Component Value Date/Time   NA 139 07/02/2014 0937   NA 138 01/27/2012 0819   NA 139 08/11/2010 1013   K 4.3 07/02/2014 0937   K 4.5 01/27/2012 0819   K 4.3 08/11/2010 1013   CL 104 09/23/2012 1104   CL 99 01/27/2012 0819   CL 102 08/11/2010 1013   CO2 28 07/02/2014 0937   CO2 28 01/27/2012 0819   CO2 26 08/11/2010 1013   BUN 20.2 07/02/2014 0937   BUN 19 01/27/2012 0819   BUN 18 08/11/2010 1013   CREATININE 1.2 07/02/2014 0937   CREATININE 1.1 01/27/2012 0819   CREATININE 1.06 08/11/2010 1013      Component Value Date/Time   CALCIUM 9.3 07/02/2014 0937   CALCIUM 9.0 01/27/2012 0819   CALCIUM 9.3 08/11/2010 1013   ALKPHOS 45 07/02/2014 0937   ALKPHOS 47 01/27/2012 0819   ALKPHOS 47 08/11/2010 1013   AST 11 07/02/2014 0937   AST 19 01/27/2012 0819   AST 15 08/11/2010 1013   ALT 11 07/02/2014 0937   ALT 24 01/27/2012 0819   ALT 25 08/11/2010 1013   BILITOT 0.59 07/02/2014 0937   BILITOT 0.70 01/27/2012 0819   BILITOT 0.6 08/11/2010 1013     03/29/2014 PET.   IMPRESSION: 1. Mixed appearance of lymphoma with retroperitoneal and pelvic lymph nodes mildly larger but with stable to mildly reduced activity compared to the prior exam. 2. Bilateral axillary lymph nodes appear very thin  and with fatty hila, but have faintly increased metabolic activity potentially reflecting lymphomatous involvement.    ASSESSMENT: Sean Hodges 74 y.o. male with a history of Follicular Lymphoma off and on therapy as detailed above and now clinically suggesting more symptomatic from disease.   PLAN:  1. Follicular Lymphoma.  He has a long-standing history with multiple treatments outlined above. His last PET scan on 03/29/2014 was reviewed and showed for the most part stable disease with very slow progression. He continues to be asymptomatic without any bulky disease. I continue to recommend observation and surveillance and use salvage chemotherapy upon symptomatic progression. I daily would be an excellent candidate for bendamustine and rituximab salvage if he develops bulky  adenopathy or symptomatic disease. I will repeat imaging studies in 4 months.  2. Back pain, Neuropathy, peripheral secondary chemotherapy. Likely prexisting neuropathy from prior chemotherapy.   3. Abdominal fullness:  Do not think this is related to his lymphoma and repeat imaging studies will quantify that.  4. Follow-up. In 4 months after repeat imaging studies.  [facility] MD 07/11/2014. 1:00 PM

## 2014-07-11 NOTE — Telephone Encounter (Signed)
Gave avs & cal for March/May

## 2014-08-20 ENCOUNTER — Ambulatory Visit (INDEPENDENT_AMBULATORY_CARE_PROVIDER_SITE_OTHER): Payer: Medicare Other

## 2014-08-20 VITALS — BP 123/75 | HR 103 | Resp 18

## 2014-08-20 DIAGNOSIS — M778 Other enthesopathies, not elsewhere classified: Secondary | ICD-10-CM

## 2014-08-20 DIAGNOSIS — R52 Pain, unspecified: Secondary | ICD-10-CM

## 2014-08-20 DIAGNOSIS — M779 Enthesopathy, unspecified: Secondary | ICD-10-CM

## 2014-08-20 DIAGNOSIS — M7752 Other enthesopathy of left foot: Secondary | ICD-10-CM

## 2014-08-20 DIAGNOSIS — G622 Polyneuropathy due to other toxic agents: Secondary | ICD-10-CM

## 2014-08-20 DIAGNOSIS — G5762 Lesion of plantar nerve, left lower limb: Secondary | ICD-10-CM

## 2014-08-20 DIAGNOSIS — IMO0002 Reserved for concepts with insufficient information to code with codable children: Secondary | ICD-10-CM

## 2014-08-20 MED ORDER — MELOXICAM 15 MG PO TABS: 15.0000 mg | ORAL_TABLET | Freq: Every day | ORAL | Status: DC

## 2014-08-20 NOTE — Progress Notes (Signed)
   Subjective:    Patient ID: Sean Hodges, male    DOB: 1941/03/20, 74 y.o.   MRN: [id number]  HPI MY LEFT FOOT HURTS ON THE BALL AND HAS BEEN GOING ON SINCE LAST YEAR AND HAS A SHARP PAIN AND BURNS AND WALKS A LOT AND I FEEL LIKE MY SOCKS ARE ROLLING UP AND I HAVE INSERTS    Review of Systems  HENT:       RINGING IN EARS  Cardiovascular: Positive for chest pain.  Gastrointestinal:       REFLUX   Skin: Positive for rash.       SKIN CANCER  All other systems reviewed and are negative.      Objective:   Physical Exam 74 year old white male well-developed well-nourished oriented 3 presents at this time with a 6 or greater month history of pain or abnormal sensation in the ball of his left foot feels like something balled up under his foot or the socks rolling up. No history of injury trauma home does a lot of walking for the past 40 years or more play a lot of tennis. Shoes wearing inserts H Rex insoles now wearing Dr. Felicie Morn insoles however they seem to be aggravating more than helping it. Lower extremity objective findings reveal vascular status to be intact pedal pulses palpable DP +2 PT plus one over 4 bilateral Refill time 3 seconds all digits epicritic and proprioceptive sensations intact although patient does have paresthesias burning and stinging due to history of lymphoma and chemotherapy treatment for lymphoma. Had tried gabapentin couldn't tolerate it continues to have paresthesia burning intact and tingling sensation hands and feet. Has significant hypersensitivity of nerves or neuritis. She also has some swelling and dorsal displacement second digit left foot x-rays AP lateral oblique views reveal some splaying of the second and third toes some asymmetric joint space during first M of the second MTP area there is posse some swelling and capsulitis second MTP as well as pain on direct lateral compression with positive Biagio Borg sign for suspect neuroma second interspace left  foot. No open wounds no ulcers no secondary infections pain on direct lateral compression second interspace is significant some tenderness on third interspace although not as severe. Right foot is asymptomatic and unremarkable.       Assessment & Plan:  Assessment this time does have a history of neuritis or neuralgia associated with chemotherapy treatment and peripheral neuropathy patient does have suspect neuroma symptoms mild to second interspace left foot at this time and injection tendons Kenalog 20 Xylocaine plain infiltrated the therapeutic nerve block the second interspace left foot patient is advised to apply ice to the affected area and a prescription for meloxicam is issued. Patient will follow-up with the next 2-3 weeks for reassessment a be candidate for additional steroid injections and anti-inflammatories if needed begin all meloxicam for prescription afforded to his pharmacy and maintain a wide accommodative shoes at this time met pads or implied his current Dr. Felicie Morn insoles are orthotics this provided some relief as well. May be candidate for orthoses in the future however this time we'll consider conservative care with steroids an NSAID therapies. Reappoint one week for follow-up and reevaluation  Harriet Masson DPM

## 2014-08-20 NOTE — Patient Instructions (Signed)

## 2014-08-30 ENCOUNTER — Ambulatory Visit (INDEPENDENT_AMBULATORY_CARE_PROVIDER_SITE_OTHER): Payer: Medicare Other | Admitting: Cardiology

## 2014-08-30 ENCOUNTER — Encounter: Payer: Self-pay | Admitting: Cardiology

## 2014-08-30 VITALS — BP 150/80 | HR 62 | Ht 69.0 in | Wt 200.5 lb

## 2014-08-30 DIAGNOSIS — E7800 Pure hypercholesterolemia, unspecified: Secondary | ICD-10-CM

## 2014-08-30 DIAGNOSIS — R072 Precordial pain: Secondary | ICD-10-CM

## 2014-08-30 DIAGNOSIS — I10 Essential (primary) hypertension: Secondary | ICD-10-CM

## 2014-08-30 DIAGNOSIS — R0609 Other forms of dyspnea: Secondary | ICD-10-CM

## 2014-08-30 DIAGNOSIS — E785 Hyperlipidemia, unspecified: Secondary | ICD-10-CM | POA: Insufficient documentation

## 2014-08-30 DIAGNOSIS — R0600 Dyspnea, unspecified: Secondary | ICD-10-CM

## 2014-08-30 HISTORY — DX: Precordial pain: R07.2

## 2014-08-30 HISTORY — DX: Other forms of dyspnea: R06.09

## 2014-08-30 LAB — BASIC METABOLIC PANEL
BUN: 20 mg/dL (ref 6–23)
CO2: 31 mEq/L (ref 19–32)
Calcium: 8.8 mg/dL (ref 8.4–10.5)
Chloride: 103 mEq/L (ref 96–112)
Creat: 1.25 mg/dL (ref 0.50–1.35)
Glucose, Bld: 90 mg/dL (ref 70–99)
Potassium: 4.6 mEq/L (ref 3.5–5.3)
Sodium: 140 mEq/L (ref 135–145)

## 2014-08-30 LAB — HEPATIC FUNCTION PANEL
ALT: 12 U/L (ref 0–53)
AST: 9 U/L (ref 0–37)
Albumin: 4.4 g/dL (ref 3.5–5.2)
Alkaline Phosphatase: 45 U/L (ref 39–117)
Bilirubin, Direct: 0.1 mg/dL (ref 0.0–0.3)
Indirect Bilirubin: 0.5 mg/dL (ref 0.2–1.2)
Total Bilirubin: 0.6 mg/dL (ref 0.2–1.2)
Total Protein: 6.3 g/dL (ref 6.0–8.3)

## 2014-08-30 LAB — LIPID PANEL
Cholesterol: 152 mg/dL (ref 0–200)
HDL: 39 mg/dL — ABNORMAL LOW (ref >=40)
LDL Cholesterol: 95 mg/dL (ref 0–99)
Total CHOL/HDL Ratio: 3.9 Ratio
Triglycerides: 90 mg/dL (ref <150)
VLDL: 18 mg/dL (ref 0–40)

## 2014-08-30 NOTE — Patient Instructions (Signed)
Stop Mobic and resume Motrin at night.  We will check lab work today   We will plan on repeating an Echo in 6 months and see you after.

## 2014-08-30 NOTE — Progress Notes (Signed)
[name] Ends Date of Birth: 11/10/1940 Medical Record #[id number]  History of Present Illness: Sean Hodges is self referred for cardiac evaluation. He is a pleasant 74 yo WM with recent symptoms of chest pain. Sean Hodges symptoms began in January and started suddenly when getting out of Sean Hodges car. He developed a sharp, stabbing pain in the lower left pectoral region below the breast. He felt nauseated all day. Sean Hodges pain only lasted 5 seconds but he continued to have some SOB and dizziness. The following day he went to the ED. Ecg showed no acute changes. He had a stress Echo. He was able to exercise for 8 minutes on the Bruce protocol. Normal HR, BP response. No ST changes. Resting EF was 40-45% but wall motion normalized with exercise to 65% with no wall motion abnormality. Since then he still has some discomfort that comes and goes. It sometimes radiates to the back. No improvement with PPI or NSAIDs. He does describe some DOE but is able to walk 3 mi/day. No edema.  He does have a history of NonHodgkin's lymphoma. He received CHOP chemotherapy in 2000. He had recurrence in 2008 and was treated with a different chemo regimen in 2008-09. Recently there is some evidence of increase but PET scan. He reports decrease in EF related to chemo in the past. He did have normal stress Myoview studies in 2007 and 2008. Echo in 2008 was normal. The patient brings these records for review today. CXR in January was normal.     Medication List       This list is accurate as of: 08/30/14  8:39 PM.  Always use your most recent med list.               aspirin EC 81 MG tablet  Take 81 mg by mouth daily.     calcium & magnesium carbonates 311-232 MG per tablet  Commonly known as:  MYLANTA  Take 1 tablet by mouth 2 (two) times daily.     COENZYME Q-10 PO  Take 50 mg by mouth 2 (two) times daily.     cyanocobalamin 1000 MCG tablet  Take 100 mcg by mouth daily.     enalapril 20 MG tablet  Commonly known as:   VASOTEC  Take 20 mg by mouth daily.     fish oil-omega-3 fatty acids 1000 MG capsule  Take 1 g by mouth daily.     glucosamine-chondroitin 500-400 MG tablet  Take 1 tablet by mouth 2 (two) times daily.     meloxicam 15 MG tablet  Commonly known as:  MOBIC  Take 1 tablet (15 mg total) by mouth daily.     pantoprazole 40 MG tablet  Commonly known as:  PROTONIX  Take 40 mg by mouth daily.     pravastatin 80 MG tablet  Commonly known as:  PRAVACHOL  Take 80 mg by mouth daily.     ranitidine 150 MG capsule  Commonly known as:  ZANTAC  Take 150 mg by mouth every evening.     saw palmetto 500 MG capsule  Take 500 mg by mouth 2 (two) times daily.     VITAMIN D3 PO  Take 500 Units by mouth 2 (two) times daily.         Allergies  Allergen Reactions  . Gabapentin Nausea Only and Other (See Comments)    dizziness    Past Medical History  Diagnosis Date  . Cancer     nhl  . Hypertension   .  Hypercholesterolemia   . Arthritis     knee osteoarthritis  . Lymphoma     Past Surgical History  Procedure Laterality Date  . Left eye surgery  2013  . Arthroscopies Bilateral   . Thyroidectomy, partial Left   . Vocal cord      polyps removed    History   Social History  . Marital Status: Married    Spouse Name: N/A  . Number of Children: 3  . Years of Education: N/A   Occupational History  . retired-engineer    Social History Main Topics  . Smoking status: Former Smoker    Quit date: 01/20/1974  . Smokeless tobacco: Former Systems developer    Quit date: 06/22/1973  . Alcohol Use: Not on file  . Drug Use: Not on file  . Sexual Activity: Not on file   Other Topics Concern  . 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 3 children [place] in [place], 2 in [place]    Family History  Problem Relation Age of Onset  . Cancer  Brother   . Cancer Brother   . Cancer Brother   . Cancer Brother   . Cancer Brother     Review of Systems: As noted in HPI. No fever, chills, weight loss.   All other systems were reviewed and are negative.  Physical Exam: BP 150/80 mmHg  Pulse 62  Ht 5\' 9"  (1.753 m)  Wt 200 lb 8 oz (90.946 kg)  BMI 29.60 kg/m2 Filed Weights   08/30/14 0915  Weight: 200 lb 8 oz (90.946 kg)  GENERAL:  Well appearing WM appears younger than stated age.  HEENT:  PERRL, EOMI, sclera are clear. Oropharynx is clear. NECK:  No jugular venous distention, carotid upstroke brisk and symmetric, no bruits, no thyromegaly or adenopathy LUNGS:  Clear to auscultation bilaterally CHEST:  There is chest wall tenderness to palpation.  HEART:  RRR,  PMI not displaced or sustained,S1 and S2 within normal limits, no S3, no S4: no clicks, no rubs, no murmurs ABD:  Soft, nontender. BS +, no masses or bruits. No hepatomegaly, no splenomegaly EXT:  2 + pulses throughout, no edema, no cyanosis no clubbing SKIN:  Warm and dry.  No rashes NEURO:  Alert and oriented x 3. Cranial nerves II through XII intact. PSYCH:  Cognitively intact    LABORATORY DATA: As noted in HPI.  Lab Results  Component Value Date   WBC 7.0 07/02/2014   HGB 15.1 07/02/2014   HCT 45.0 07/02/2014   PLT 180 07/02/2014   GLUCOSE 90 08/30/2014   CHOL 152 08/30/2014   TRIG 90 08/30/2014   HDL 39* 08/30/2014   LDLCALC 95 08/30/2014   ALT 12 08/30/2014   AST 9 08/30/2014   NA 140 08/30/2014   K 4.6 08/30/2014   CL 103 08/30/2014   CREATININE 1.25 08/30/2014   BUN 20 08/30/2014   CO2 31 08/30/2014   TSH 1.706 02/03/2008   Ecg today shows NSR with normal Ecg.   Assessment / Plan: 1. Atypical chest pain. Sean Hodges history and physical exam are more consistent with musculoskeletal pain. Stress echo showed no evidence of ischemia. I have reassured him of these findings and recommend prn NSAIDs with Motrin which he states has worked better than  Mobic in the past. Suggested he may stop antireflux  therapy since no symptom benefit.   2. Nonischemic cardiomyopathy. EF 40-45% by recent stress Echo. This does not correlate with prior cardiac evaluation in 2008. ? Related to chemo at that time. No overt CHF. Will continue ACEi. Will repeat Echo in 6 months.   3. Hyperlipidemia. On pravastatin. Dose increased in January. LDL at that time was 110. Will repeat lipids now. He has a history of intolerance to lipitor. Cost is a factor with Crestor but this may go generic this year.   4. NHL- followed by oncology.   5. HTN continue ACEi.

## 2014-09-03 ENCOUNTER — Encounter: Payer: Self-pay | Admitting: Cardiology

## 2014-09-03 ENCOUNTER — Ambulatory Visit (INDEPENDENT_AMBULATORY_CARE_PROVIDER_SITE_OTHER): Payer: Medicare Other

## 2014-09-03 VITALS — BP 126/91 | HR 89 | Resp 18

## 2014-09-03 DIAGNOSIS — G5762 Lesion of plantar nerve, left lower limb: Secondary | ICD-10-CM | POA: Diagnosis not present

## 2014-09-03 DIAGNOSIS — IMO0002 Reserved for concepts with insufficient information to code with codable children: Secondary | ICD-10-CM

## 2014-09-03 DIAGNOSIS — R52 Pain, unspecified: Secondary | ICD-10-CM | POA: Diagnosis not present

## 2014-09-03 DIAGNOSIS — M778 Other enthesopathies, not elsewhere classified: Secondary | ICD-10-CM

## 2014-09-03 DIAGNOSIS — M7752 Other enthesopathy of left foot: Secondary | ICD-10-CM | POA: Diagnosis not present

## 2014-09-03 DIAGNOSIS — G622 Polyneuropathy due to other toxic agents: Secondary | ICD-10-CM | POA: Diagnosis not present

## 2014-09-03 DIAGNOSIS — M779 Enthesopathy, unspecified: Secondary | ICD-10-CM

## 2014-09-03 NOTE — Progress Notes (Signed)
Subjective:    Patient ID: Sean Hodges, male    DOB: 1941/06/09, 74 y.o.   MRN: [id number]  HPI my left foot is doing a lot better    Review of Systems no new findings or systemic changes noted    Objective:   Physical Exam Lower extremity objective findings unchanged pedal pulses are palpable DP +2 PT 1 over 4 Refill time 3 seconds deathly had improvement with the cortisone injection to the second interspace left foot still has some slight tenderness on direct lateral compression all her greatly reduced still some splaying of second and third toes consistent with soft tissue swelling in the second interspace. Based the fact improved with the met pads and the injection this time patient candidate for orthoses prescription orthoses noncovered however an OTC orthotic isn't the cost $45 physician the patient this time with modification of met pads applied to both orthotics. Remainder the exam unremarkable unchanged consider two-month long-term follow-up       Assessment & Plan:  Assessment is consistent with early neuroma symptomology second interspace left foot versus capsulitis and osteoarthropathy. Patient did have improvement with the injection and anti-inflammatory patient this time is a candidate for orthoses per patient request the recommendation dispensed power step orthotics with modifications of met pads check in a month or 2 for adjustments if needed in the future based on progress her Claudette Stapler DPM

## 2014-09-03 NOTE — Patient Instructions (Signed)

## 2014-09-05 ENCOUNTER — Ambulatory Visit (HOSPITAL_BASED_OUTPATIENT_CLINIC_OR_DEPARTMENT_OTHER): Payer: Medicare Other

## 2014-09-05 ENCOUNTER — Other Ambulatory Visit: Payer: Medicare Other

## 2014-09-05 VITALS — BP 135/69 | HR 62 | Temp 97.5°F | Resp 17

## 2014-09-05 DIAGNOSIS — Z452 Encounter for adjustment and management of vascular access device: Secondary | ICD-10-CM

## 2014-09-05 DIAGNOSIS — C8290 Follicular lymphoma, unspecified, unspecified site: Secondary | ICD-10-CM

## 2014-09-05 DIAGNOSIS — Z95828 Presence of other vascular implants and grafts: Secondary | ICD-10-CM

## 2014-09-05 MED ORDER — SODIUM CHLORIDE 0.9 % IJ SOLN
10.0000 mL | INTRAMUSCULAR | Status: DC | PRN
  Administered 2014-09-05: 10 mL via INTRAVENOUS
  Filled 2014-09-05: qty 10

## 2014-09-05 MED ORDER — HEPARIN SOD (PORK) LOCK FLUSH 100 UNIT/ML IV SOLN
500.0000 [IU] | Freq: Once | INTRAVENOUS | Status: AC
  Administered 2014-09-05: 500 [IU] via INTRAVENOUS
  Filled 2014-09-05: qty 5

## 2014-09-05 NOTE — Patient Instructions (Signed)

## 2014-10-29 ENCOUNTER — Encounter: Payer: Self-pay | Admitting: Cardiology

## 2014-10-30 ENCOUNTER — Other Ambulatory Visit: Payer: Self-pay

## 2014-10-30 DIAGNOSIS — I428 Other cardiomyopathies: Secondary | ICD-10-CM

## 2014-11-05 ENCOUNTER — Ambulatory Visit: Payer: Medicare Other

## 2014-11-06 ENCOUNTER — Other Ambulatory Visit: Payer: Self-pay | Admitting: Oncology

## 2014-11-06 DIAGNOSIS — C8290 Follicular lymphoma, unspecified, unspecified site: Secondary | ICD-10-CM

## 2014-11-07 ENCOUNTER — Other Ambulatory Visit (HOSPITAL_BASED_OUTPATIENT_CLINIC_OR_DEPARTMENT_OTHER): Payer: Medicare Other

## 2014-11-07 ENCOUNTER — Ambulatory Visit (HOSPITAL_BASED_OUTPATIENT_CLINIC_OR_DEPARTMENT_OTHER): Payer: Medicare Other

## 2014-11-07 ENCOUNTER — Ambulatory Visit (HOSPITAL_COMMUNITY): Admission: RE | Admit: 2014-11-07 | Payer: Medicare Other | Source: Ambulatory Visit

## 2014-11-07 ENCOUNTER — Ambulatory Visit (HOSPITAL_COMMUNITY)
Admission: RE | Admit: 2014-11-07 | Discharge: 2014-11-07 | Disposition: A | Discharge status: Home / Self Care | Payer: Medicare Other | Source: Ambulatory Visit | Attending: Oncology | Admitting: Oncology

## 2014-11-07 DIAGNOSIS — C8290 Follicular lymphoma, unspecified, unspecified site: Secondary | ICD-10-CM

## 2014-11-07 DIAGNOSIS — Z95828 Presence of other vascular implants and grafts: Secondary | ICD-10-CM

## 2014-11-07 LAB — COMPREHENSIVE METABOLIC PANEL (CC13)
ALT: 14 U/L (ref 0–55)
AST: 10 U/L (ref 5–34)
Albumin: 3.8 g/dL (ref 3.5–5.0)
Alkaline Phosphatase: 43 U/L (ref 40–150)
Anion Gap: 10 mEq/L (ref 3–11)
BUN: 18.2 mg/dL (ref 7.0–26.0)
CO2: 26 mEq/L (ref 22–29)
Calcium: 9 mg/dL (ref 8.4–10.4)
Chloride: 106 mEq/L (ref 98–109)
Creatinine: 1 mg/dL (ref 0.7–1.3)
EGFR: 72 mL/min/{1.73_m2} — ABNORMAL LOW (ref >90)
Glucose: 98 mg/dl (ref 70–140)
Potassium: 4.6 mEq/L (ref 3.5–5.1)
Sodium: 142 mEq/L (ref 136–145)
Total Bilirubin: 0.64 mg/dL (ref 0.20–1.20)
Total Protein: 6.3 g/dL — ABNORMAL LOW (ref 6.4–8.3)

## 2014-11-07 LAB — GLUCOSE, CAPILLARY: Glucose-Capillary: 92 mg/dL (ref 65–99)

## 2014-11-07 LAB — CBC WITH DIFFERENTIAL/PLATELET
BASO%: 1.3 % (ref 0.0–2.0)
Basophils Absolute: 0.1 10*3/uL (ref 0.0–0.1)
EOS%: 3.7 % (ref 0.0–7.0)
Eosinophils Absolute: 0.2 10*3/uL (ref 0.0–0.5)
HCT: 42.1 % (ref 38.4–49.9)
HGB: 14.4 g/dL (ref 13.0–17.1)
LYMPH%: 23.1 % (ref 14.0–49.0)
MCH: 29.2 pg (ref 27.2–33.4)
MCHC: 34.3 g/dL (ref 32.0–36.0)
MCV: 85.1 fL (ref 79.3–98.0)
MONO#: 0.7 10*3/uL (ref 0.1–0.9)
MONO%: 11.3 % (ref 0.0–14.0)
NEUT#: 3.5 10*3/uL (ref 1.5–6.5)
NEUT%: 60.6 % (ref 39.0–75.0)
Platelets: 176 10*3/uL (ref 140–400)
RBC: 4.95 10*6/uL (ref 4.20–5.82)
RDW: 13.8 % (ref 11.0–14.6)
WBC: 5.8 10*3/uL (ref 4.0–10.3)
lymph#: 1.4 10*3/uL (ref 0.9–3.3)

## 2014-11-07 MED ORDER — HEPARIN SOD (PORK) LOCK FLUSH 100 UNIT/ML IV SOLN
500.0000 [IU] | Freq: Once | INTRAVENOUS | Status: AC
  Administered 2014-11-07: 500 [IU] via INTRAVENOUS
  Filled 2014-11-07: qty 5

## 2014-11-07 MED ORDER — SODIUM CHLORIDE 0.9 % IJ SOLN
10.0000 mL | INTRAMUSCULAR | Status: DC | PRN
  Administered 2014-11-07: 10 mL via INTRAVENOUS
  Filled 2014-11-07: qty 10

## 2014-11-07 MED ORDER — FLUDEOXYGLUCOSE F - 18 (FDG) INJECTION
9.8900 | Freq: Once | INTRAVENOUS | Status: AC | PRN
  Administered 2014-11-07: 9.89 via INTRAVENOUS

## 2014-11-07 NOTE — Patient Instructions (Signed)
Patient to have port-a-cath deaccessed today before leaving [facility] after PET scan unless otherwise instructed.    Implanted [facility] Guide An implanted port is a type of central line that is placed under the skin. Central lines are used to provide IV access when treatment or nutrition needs to be given through a person's veins. Implanted ports are used for long-term IV access. An implanted port may be placed because:   You need IV medicine that would be irritating to the small veins in your hands or arms.   You need long-term IV medicines, such as antibiotics.   You need IV nutrition for a long period.   You need frequent blood draws for lab tests.   You need dialysis.  Implanted ports are usually placed in the chest area, but they can also be placed in the upper arm, the abdomen, or the leg. An implanted port has two main parts:   Reservoir. The reservoir is round and will appear as a small, raised area under your skin. The reservoir is the part where a needle is inserted to give medicines or draw blood.   Catheter. The catheter is a thin, flexible tube that extends from the reservoir. The catheter is placed into a large vein. Medicine that is inserted into the reservoir goes into the catheter and then into the vein.  HOW WILL I CARE FOR MY INCISION SITE? Do not get the incision site wet. Bathe or shower as directed by your health care provider.  HOW IS MY PORT ACCESSED? Special steps must be taken to access the port:   Before the port is accessed, a numbing cream can be placed on the skin. This helps numb the skin over the port site.   Your health care provider uses a sterile technique to access the port.  Your health care provider must put on a mask and sterile gloves.  The skin over your port is cleaned carefully with an antiseptic and allowed to dry.  The port is gently pinched between sterile gloves, and a needle is inserted into the port.  Only "non-coring" port  needles should be used to access the port. Once the port is accessed, a blood return should be checked. This helps ensure that the port is in the vein and is not clogged.   If your port needs to remain accessed for a constant infusion, a clear (transparent) bandage will be placed over the needle site. The bandage and needle will need to be changed every week, or as directed by your health care provider.   Keep the bandage covering the needle clean and dry. Do not get it wet. Follow your health care provider's instructions on how to take a shower or bath while the port is accessed.   If your port does not need to stay accessed, no bandage is needed over the port.  WHAT IS FLUSHING? Flushing helps keep the port from getting clogged. Follow your health care provider's instructions on how and when to flush the port. Ports are usually flushed with saline solution or a medicine called heparin. The need for flushing will depend on how the port is used.   If the port is used for intermittent medicines or blood draws, the port will need to be flushed:   After medicines have been given.   After blood has been drawn.   As part of routine maintenance.   If a constant infusion is running, the port may not need to be flushed.  HOW  LONG WILL MY PORT STAY IMPLANTED? The port can stay in for as long as your health care provider thinks it is needed. When it is time for the port to come out, surgery will be done to remove it. The procedure is similar to the one performed when the port was put in.  WHEN SHOULD I SEEK IMMEDIATE MEDICAL CARE? When you have an implanted port, you should seek immediate medical care if:   You notice a bad smell coming from the incision site.   You have swelling, redness, or drainage at the incision site.   You have more swelling or pain at the port site or the surrounding area.   You have a fever that is not controlled with medicine. Document Released: 06/08/2005  Document Revised: 03/29/2013 Document Reviewed: 02/13/2013 [facility] Patient Information 2015 [place], [place]. This information is not intended to replace advice given to you by your health care provider. Make sure you discuss any questions you have with your health care provider.

## 2014-11-08 ENCOUNTER — Telehealth: Payer: Self-pay

## 2014-11-08 NOTE — Telephone Encounter (Signed)
Patient called about 10/29/14 LDL 106.Dr.Jordan advised continue current medication.May want to reassess once crestor goes generic.Advised to keep appointment with Dr.Jordan 03/07/15 at 9:45 am.

## 2014-11-09 ENCOUNTER — Ambulatory Visit (HOSPITAL_BASED_OUTPATIENT_CLINIC_OR_DEPARTMENT_OTHER): Payer: Medicare Other | Admitting: Oncology

## 2014-11-09 ENCOUNTER — Telehealth: Payer: Self-pay | Admitting: Oncology

## 2014-11-09 VITALS — BP 144/67 | HR 60 | Temp 97.9°F | Resp 18 | Ht 69.0 in | Wt 202.4 lb

## 2014-11-09 DIAGNOSIS — G893 Neoplasm related pain (acute) (chronic): Secondary | ICD-10-CM

## 2014-11-09 DIAGNOSIS — C8290 Follicular lymphoma, unspecified, unspecified site: Secondary | ICD-10-CM | POA: Diagnosis not present

## 2014-11-09 NOTE — Telephone Encounter (Signed)
Gave adn printed appt sched and avs for pt for July Sept

## 2014-11-09 NOTE — Progress Notes (Signed)
[place] PROGRESS NOTE   Sean Hodges Sean Blonder., MD [address] [place] [address]  Principle diagnosis: 74 year old gentleman Follicular Lymphoma. He was initially diagnosed in June 2000 after presenting with thyroid nodule. He is status post multiple therapies since that time.  PAST THERAPY:  His past 6 cycles of CHOP between July and November 2000.  Weekly Rituxan x 8 weeks. He stayed in remission until August 2008 when PET/CT showed recurrence and biopsy showed a grade I FL.  He received 6 cycles of R-CVP between September 2008 through January 2009.  Then he relocated to [place], [place].He was offered Rituxan monotherapy every 2 months x 2 years by Dr Ralene Ok and got 1 dose but then Dr Juliann Mule stopped after 1 dose and plan was to treat him only once he had a recurrence.  He stayed in Remission until October 2014. Last 2 scans are consistent with recurrent disease including a PET scan in October 2014 and a CAT scan in March 2015. His changes on the scans are very minimal and have not require treatment.   CURRENT THERAPY: Observation and surveillance.  INTERVAL HISTORY:  Sean Hodges presents for follow-up visit. Since the last visit, he reports no complaints. He did presented to the emergency department with chest pain but his cardiac workup has been unrevealing. His chest pain or result at that time. He is followed by cardiology for slightly decreased ejection fraction. He does have some occasional night sweats but for the most part have been very minimal. He denies any fevers.  He denies any weight changes. He denies any abdominal pain . He reports feeling pretty well. He continues to perform activities of daily living without any hindrance or decline. He does not report any other constitutional symptoms.  He does not report any headaches, blurry vision, syncope or seizures. He does not report chest pain, palpitation, leg edema. He does not report any  shortness of breath, cough or hemoptysis or hematemesis. Does not report any nausea, vomiting, abdominal pain, hematochezia, melena. Does not report any neurological deficits. She does not report any lymphadenopathy, petechiae or bleeding. Rest of his review of systems unremarkable.    ALLERGIES:  is allergic to gabapentin and lipitor.  MEDICATIONS:   Current Outpatient Prescriptions  Medication Sig Dispense Refill  . aspirin EC 81 MG tablet Take 81 mg by mouth daily.    . calcium & magnesium carbonates (MYLANTA) 311-232 MG per tablet Take 1 tablet by mouth 2 (two) times daily.    . Cholecalciferol (VITAMIN D3 PO) Take 500 Units by mouth 2 (two) times daily.    Marland Kitchen COENZYME Q-10 PO Take 50 mg by mouth 2 (two) times daily.     . cyanocobalamin 1000 MCG tablet Take 100 mcg by mouth daily.    . enalapril (VASOTEC) 10 MG tablet Take 10 mg by mouth daily.  0  . fish oil-omega-3 fatty acids 1000 MG capsule Take 1 g by mouth daily.    Marland Kitchen glucosamine-chondroitin 500-400 MG tablet Take 1 tablet by mouth 2 (two) times daily.    . meloxicam (MOBIC) 15 MG tablet Take 1 tablet (15 mg total) by mouth daily. 30 tablet 1  . pravastatin (PRAVACHOL) 80 MG tablet Take 80 mg by mouth daily.   1  . ranitidine (ZANTAC) 150 MG capsule Take 150 mg by mouth every evening.    . saw palmetto 500 MG capsule Take 500 mg by mouth 2 (two) times daily.  No current facility-administered medications for this visit.     PHYSICAL EXAMINATION: ECOG PERFORMANCE STATUS: 0  Blood pressure 144/67, pulse 60, temperature 97.9 F (36.6 C), temperature source Oral, resp. rate 18, height 5\' 9"  (1.753 m), weight 202 lb 6.4 oz (91.808 kg), SpO2 98 %.  GENERAL:alert, no distress and comfortable; SKIN: skin color, texture, turgor are normal. Right Port-A-Cath in place without erythema or induration. EYES: normal, Conjunctiva are pink and non-injected, sclera clear OROPHARYNX:no exudate, no erythema and lips, buccal mucosa, and  tongue normal  NECK: supple, thyroid normal size, non-tender. LYMPH:  no palpable lymphadenopathy in the cervical, axillary, inguinal or supraclavicular LUNGS: clear to auscultation and percussion with normal breathing effort HEART: regular rate & rhythm and no murmurs and no lower extremity edema ABDOMEN:abdomen soft, non-tender and normal bowel sounds Musculoskeletal:no cyanosis of digits and no clubbing  NEURO: alert & oriented x 3 with fluent speech, no focal motor/sensory deficits   Labs:  Lab Results  Component Value Date   WBC 5.8 11/07/2014   HGB 14.4 11/07/2014   HCT 42.1 11/07/2014   MCV 85.1 11/07/2014   PLT 176 11/07/2014   NEUTROABS 3.5 11/07/2014      Chemistry      Component Value Date/Time   NA 142 11/07/2014 0947   NA 140 08/30/2014 1028   NA 138 01/27/2012 0819   K 4.6 11/07/2014 0947   K 4.6 08/30/2014 1028   K 4.5 01/27/2012 0819   CL 103 08/30/2014 1028   CL 104 09/23/2012 1104   CL 99 01/27/2012 0819   CO2 26 11/07/2014 0947   CO2 31 08/30/2014 1028   CO2 28 01/27/2012 0819   BUN 18.2 11/07/2014 0947   BUN 20 08/30/2014 1028   BUN 19 01/27/2012 0819   CREATININE 1.0 11/07/2014 0947   CREATININE 1.25 08/30/2014 1028   CREATININE 1.06 08/11/2010 1013      Component Value Date/Time   CALCIUM 9.0 11/07/2014 0947   CALCIUM 8.8 08/30/2014 1028   CALCIUM 9.0 01/27/2012 0819   ALKPHOS 43 11/07/2014 0947   ALKPHOS 45 08/30/2014 1028   ALKPHOS 47 01/27/2012 0819   AST 10 11/07/2014 0947   AST 9 08/30/2014 1028   AST 19 01/27/2012 0819   ALT 14 11/07/2014 0947   ALT 12 08/30/2014 1028   ALT 24 01/27/2012 0819   BILITOT 0.64 11/07/2014 0947   BILITOT 0.6 08/30/2014 1028   BILITOT 0.70 01/27/2012 0819       EXAM: NUCLEAR MEDICINE PET SKULL BASE TO THIGH  TECHNIQUE: 9.9 mCi F-18 FDG was injected intravenously. Full-ring PET imaging was performed from the skull base to thigh after the radiotracer. CT data was obtained and used for  attenuation correction and anatomic localization.  FASTING BLOOD GLUCOSE: Value: HU mg/dl  COMPARISON: 03/29/2014  FINDINGS: NECK  No areas of abnormal hypermetabolism.  CHEST  Hypermetabolism again corresponds to small bilateral axillary nodes. An index right axillary node measures 6 mm and a S.U.V. max of 2.9 on image 72. On the prior exam, this measured 8 mm and a S.U.V. max of 3.3.  ABDOMEN/PELVIS  hypermetabolic abdominal adenopathy. A left periaortic node measures 1.5 cm and a S.U.V. max of 4.2. On the prior exam, this measured 1.6 cm and a S.U.V. max of 4.9.  More inferior left periaortic node measures 1.8 cm and a S.U.V. max of 4.1 on image 148. On the prior exam, this measured 1.8 cm and a S.U.V. max of 4.9 (when remeasured).  Right external iliac node measures 2.1 cm and a S.U.V. max of 5.9 on image 184. On the prior exam, this measured 2.2 cm and a S.U.V. max of 7.1.  SKELETON  No abnormal marrow activity.  CT IMAGES PERFORMED FOR ATTENUATION CORRECTION  No cervical adenopathy. A right Port-A-Cath which terminates at the mid SVC. LAD coronary artery atherosclerosis. Minimal perifissural left lower lobe nodularity on image 43 is similar and likely due to a subpleural lymph node.  Interpolar right renal fluid density lesion which is likely a cyst. Abdominal aortic atherosclerosis. Mild prostatomegaly. Bilateral hip osteoarthritis.  IMPRESSION: 1. Minimal improvement in abdominal pelvic lymphoma as evidenced by decreased hypermetabolism of enlarged nodes. 2. Small axillary nodes which are similarly hypermetabolic and indeterminate. 3. No new sites of disease.     ASSESSMENT: Sean Hodges 74 y.o. male with a history of Follicular Lymphoma off and on therapy as detailed above:  PLAN:  1. Follicular Lymphoma.  He has a long-standing history with multiple treatments outlined above. His last PET scan on 11/07/2014 was reviewed  today and was discussed with the patient and his wife. His disease have minimally changed in the last 2 years. He continues to be asymptomatic without any bulky lymphadenopathy. We have elected to continue with observation and surveillance at this time and treat when he becomes symptomatic. I educated him about the symptoms today that include fevers, chills, sweats, weight loss or peripheral adenopathy. We will continue with occasional imaging studies to rule out any bulky adenopathy. I plan on repeating imaging studies in 8 months.  2. Back pain, Neuropathy, peripheral secondary chemotherapy. Manageable at this time without any major changes.  3. Port-A-Cath management: He will continue to have this flushed every 2 months.  4. Follow-up. In 4 months a  [facility] MD 11/09/2014

## 2014-11-15 DIAGNOSIS — G8929 Other chronic pain: Secondary | ICD-10-CM | POA: Insufficient documentation

## 2014-11-15 DIAGNOSIS — M25562 Pain in left knee: Secondary | ICD-10-CM

## 2014-11-15 HISTORY — DX: Pain in left knee: M25.562

## 2014-11-15 HISTORY — DX: Other chronic pain: G89.29

## 2014-11-20 DIAGNOSIS — M2342 Loose body in knee, left knee: Secondary | ICD-10-CM | POA: Insufficient documentation

## 2014-11-20 HISTORY — DX: Loose body in knee, left knee: M23.42

## 2014-11-26 DIAGNOSIS — Z9889 Other specified postprocedural states: Secondary | ICD-10-CM

## 2014-11-26 HISTORY — DX: Other specified postprocedural states: Z98.890

## 2015-01-04 ENCOUNTER — Ambulatory Visit (HOSPITAL_BASED_OUTPATIENT_CLINIC_OR_DEPARTMENT_OTHER): Payer: Medicare Other

## 2015-01-04 VITALS — BP 138/88 | HR 64 | Temp 97.8°F | Resp 18

## 2015-01-04 DIAGNOSIS — Z452 Encounter for adjustment and management of vascular access device: Secondary | ICD-10-CM | POA: Diagnosis not present

## 2015-01-04 DIAGNOSIS — C8290 Follicular lymphoma, unspecified, unspecified site: Secondary | ICD-10-CM

## 2015-01-04 DIAGNOSIS — Z95828 Presence of other vascular implants and grafts: Secondary | ICD-10-CM

## 2015-01-04 MED ORDER — HEPARIN SOD (PORK) LOCK FLUSH 100 UNIT/ML IV SOLN
500.0000 [IU] | Freq: Once | INTRAVENOUS | Status: DC
  Filled 2015-01-04: qty 5

## 2015-01-04 MED ORDER — SODIUM CHLORIDE 0.9 % IJ SOLN
10.0000 mL | INTRAMUSCULAR | Status: DC | PRN
  Filled 2015-01-04: qty 10

## 2015-01-04 MED ORDER — HEPARIN SOD (PORK) LOCK FLUSH 100 UNIT/ML IV SOLN
500.0000 [IU] | Freq: Once | INTRAVENOUS | Status: AC
  Administered 2015-01-04: 500 [IU] via INTRAVENOUS
  Filled 2015-01-04: qty 5

## 2015-01-04 MED ORDER — SODIUM CHLORIDE 0.9 % IJ SOLN
10.0000 mL | INTRAMUSCULAR | Status: DC | PRN
  Administered 2015-01-04: 10 mL via INTRAVENOUS
  Filled 2015-01-04: qty 10

## 2015-01-04 NOTE — Patient Instructions (Signed)

## 2015-02-27 ENCOUNTER — Telehealth: Payer: Self-pay | Admitting: Oncology

## 2015-02-27 ENCOUNTER — Ambulatory Visit (HOSPITAL_BASED_OUTPATIENT_CLINIC_OR_DEPARTMENT_OTHER): Payer: Medicare Other | Admitting: Oncology

## 2015-02-27 ENCOUNTER — Other Ambulatory Visit (HOSPITAL_BASED_OUTPATIENT_CLINIC_OR_DEPARTMENT_OTHER): Payer: Medicare Other

## 2015-02-27 ENCOUNTER — Ambulatory Visit (HOSPITAL_BASED_OUTPATIENT_CLINIC_OR_DEPARTMENT_OTHER): Payer: Medicare Other

## 2015-02-27 VITALS — BP 140/71 | HR 64 | Temp 98.0°F | Resp 18 | Ht 69.0 in | Wt 202.4 lb

## 2015-02-27 DIAGNOSIS — C8290 Follicular lymphoma, unspecified, unspecified site: Secondary | ICD-10-CM | POA: Diagnosis not present

## 2015-02-27 DIAGNOSIS — M549 Dorsalgia, unspecified: Secondary | ICD-10-CM

## 2015-02-27 DIAGNOSIS — G8929 Other chronic pain: Secondary | ICD-10-CM

## 2015-02-27 DIAGNOSIS — Z95828 Presence of other vascular implants and grafts: Secondary | ICD-10-CM

## 2015-02-27 LAB — COMPREHENSIVE METABOLIC PANEL (CC13)
ALT: 13 U/L (ref 0–55)
AST: 9 U/L (ref 5–34)
Albumin: 4 g/dL (ref 3.5–5.0)
Alkaline Phosphatase: 52 U/L (ref 40–150)
Anion Gap: 7 mEq/L (ref 3–11)
BUN: 18.1 mg/dL (ref 7.0–26.0)
CO2: 28 mEq/L (ref 22–29)
Calcium: 9.4 mg/dL (ref 8.4–10.4)
Chloride: 107 mEq/L (ref 98–109)
Creatinine: 1.1 mg/dL (ref 0.7–1.3)
EGFR: 64 mL/min/{1.73_m2} — ABNORMAL LOW (ref >90)
Glucose: 96 mg/dl (ref 70–140)
Potassium: 4.3 mEq/L (ref 3.5–5.1)
Sodium: 142 mEq/L (ref 136–145)
Total Bilirubin: 0.5 mg/dL (ref 0.20–1.20)
Total Protein: 6.5 g/dL (ref 6.4–8.3)

## 2015-02-27 LAB — CBC WITH DIFFERENTIAL/PLATELET
BASO%: 1 % (ref 0.0–2.0)
Basophils Absolute: 0.1 10*3/uL (ref 0.0–0.1)
EOS%: 3.5 % (ref 0.0–7.0)
Eosinophils Absolute: 0.2 10*3/uL (ref 0.0–0.5)
HCT: 44.1 % (ref 38.4–49.9)
HGB: 14.9 g/dL (ref 13.0–17.1)
LYMPH%: 20.2 % (ref 14.0–49.0)
MCH: 29 pg (ref 27.2–33.4)
MCHC: 33.7 g/dL (ref 32.0–36.0)
MCV: 85.8 fL (ref 79.3–98.0)
MONO#: 0.8 10*3/uL (ref 0.1–0.9)
MONO%: 11.7 % (ref 0.0–14.0)
NEUT#: 4.2 10*3/uL (ref 1.5–6.5)
NEUT%: 63.6 % (ref 39.0–75.0)
Platelets: 165 10*3/uL (ref 140–400)
RBC: 5.14 10*6/uL (ref 4.20–5.82)
RDW: 13.8 % (ref 11.0–14.6)
WBC: 6.7 10*3/uL (ref 4.0–10.3)
lymph#: 1.3 10*3/uL (ref 0.9–3.3)

## 2015-02-27 MED ORDER — SODIUM CHLORIDE 0.9 % IJ SOLN
10.0000 mL | INTRAMUSCULAR | Status: DC | PRN
  Administered 2015-02-27: 10 mL via INTRAVENOUS
  Filled 2015-02-27: qty 10

## 2015-02-27 MED ORDER — HEPARIN SOD (PORK) LOCK FLUSH 100 UNIT/ML IV SOLN
500.0000 [IU] | Freq: Once | INTRAVENOUS | Status: AC
  Administered 2015-02-27: 500 [IU] via INTRAVENOUS
  Filled 2015-02-27: qty 5

## 2015-02-27 NOTE — Progress Notes (Signed)
[facility] PROGRESS NOTE   Lin Landsman Angelique Blonder., MD [address] [place] [address]  Principle diagnosis: 74 year old gentleman Follicular Lymphoma. He was initially diagnosed in June 2000 after presenting with thyroid nodule. He is status post multiple therapies since that time.  PAST THERAPY:  His past 6 cycles of CHOP between July and November 2000.  Weekly Rituxan x 8 weeks. He stayed in remission until August 2008 when PET/CT showed recurrence and biopsy showed a grade I FL.  He received 6 cycles of R-CVP between September 2008 through January 2009.  Then he relocated to [place], [place].He was offered Rituxan monotherapy every 2 months x 2 years by Dr Ralene Ok and got 1 dose but then Dr Juliann Mule stopped after 1 dose and plan was to treat him only once he had a recurrence.  He stayed in remission until October 2014. Last  scans are consistent with recurrent disease including a PET scan in October 2014 and a CAT scan in March 2015. His changes on the scans are very minimal and have not require treatment.   CURRENT THERAPY: Observation and surveillance.  INTERVAL HISTORY:  Mr. Sean Hodges presents for follow-up visit. Since the last visit, he reports a few complaints. His complaints are rather vague and nonspecific. He reports intermittent hip pain and knee pain and takes Advil with resolution of his symptoms. He also reported some headaches especially after working outside for an extended period of time. He does not report any neurological symptoms. Does not report any weakness or peripheral neuropathy. Does not report any syncope or seizures.   He denies any fevers.  He denies any weight changes. He denies any abdominal pain.  He continues to perform activities of daily living without any hindrance or decline. He does not report any other constitutional symptoms.  He does not report any blurry vision or double vision. He does not report chest pain, palpitation,  leg edema. He does not report any shortness of breath, cough or hemoptysis or hematemesis. Does not report any nausea, vomiting, abdominal pain, hematochezia, melena. Does not report any neurological deficits. She does not report any lymphadenopathy, petechiae or bleeding. Rest of his review of systems unremarkable.    ALLERGIES:  is allergic to gabapentin and lipitor.  MEDICATIONS:   Current Outpatient Prescriptions  Medication Sig Dispense Refill  . aspirin EC 81 MG tablet Take 81 mg by mouth daily.    . calcium & magnesium carbonates (MYLANTA) 311-232 MG per tablet Take 1 tablet by mouth 2 (two) times daily.    . Cholecalciferol (VITAMIN D3 PO) Take 500 Units by mouth 2 (two) times daily.    Marland Kitchen COENZYME Q-10 PO Take 50 mg by mouth 2 (two) times daily.     . cyanocobalamin 1000 MCG tablet Take 100 mcg by mouth daily.    . enalapril (VASOTEC) 10 MG tablet Take 10 mg by mouth daily.  0  . fish oil-omega-3 fatty acids 1000 MG capsule Take 1 g by mouth daily.    Marland Kitchen glucosamine-chondroitin 500-400 MG tablet Take 1 tablet by mouth 2 (two) times daily.    . pravastatin (PRAVACHOL) 80 MG tablet Take 80 mg by mouth daily.   1  . ranitidine (ZANTAC) 150 MG capsule Take 150 mg by mouth every evening.    . saw palmetto 500 MG capsule Take 500 mg by mouth 2 (two) times daily.     No current facility-administered medications for this visit.     PHYSICAL  EXAMINATION: ECOG PERFORMANCE STATUS: 0  Blood pressure 140/71, pulse 64, temperature 98 F (36.7 C), temperature source Oral, resp. rate 18, height 5\' 9"  (1.753 m), weight 202 lb 6.4 oz (91.808 kg), SpO2 99 %. ECOG 1 GENERAL:alert, no distress and comfortable; SKIN: skin color, texture, turgor are normal.  EYES: normal, Conjunctiva are pink and non-injected, sclera clear OROPHARYNX:no exudate, no erythema and lips, buccal mucosa, and tongue normal. No oral thrush. NECK: supple, thyroid normal size, non-tender. LYMPH:  no palpable  lymphadenopathy in the cervical, axillary, inguinal or supraclavicular LUNGS: clear to auscultation and percussion with normal breathing effort HEART: regular rate & rhythm and no murmurs and no lower extremity edema ABDOMEN:abdomen soft, non-tender and normal bowel sounds Musculoskeletal:no cyanosis of digits and no clubbing  NEURO: alert & oriented x 3 with fluent speech, no focal motor/sensory deficits   Labs:  Lab Results  Component Value Date   WBC 6.7 02/27/2015   HGB 14.9 02/27/2015   HCT 44.1 02/27/2015   MCV 85.8 02/27/2015   PLT 165 02/27/2015   NEUTROABS 4.2 02/27/2015      Chemistry      Component Value Date/Time   NA 142 11/07/2014 0947   NA 140 08/30/2014 1028   NA 138 01/27/2012 0819   K 4.6 11/07/2014 0947   K 4.6 08/30/2014 1028   K 4.5 01/27/2012 0819   CL 103 08/30/2014 1028   CL 104 09/23/2012 1104   CL 99 01/27/2012 0819   CO2 26 11/07/2014 0947   CO2 31 08/30/2014 1028   CO2 28 01/27/2012 0819   BUN 18.2 11/07/2014 0947   BUN 20 08/30/2014 1028   BUN 19 01/27/2012 0819   CREATININE 1.0 11/07/2014 0947   CREATININE 1.25 08/30/2014 1028   CREATININE 1.06 08/11/2010 1013      Component Value Date/Time   CALCIUM 9.0 11/07/2014 0947   CALCIUM 8.8 08/30/2014 1028   CALCIUM 9.0 01/27/2012 0819   ALKPHOS 43 11/07/2014 0947   ALKPHOS 45 08/30/2014 1028   ALKPHOS 47 01/27/2012 0819   AST 10 11/07/2014 0947   AST 9 08/30/2014 1028   AST 19 01/27/2012 0819   ALT 14 11/07/2014 0947   ALT 12 08/30/2014 1028   ALT 24 01/27/2012 0819   BILITOT 0.64 11/07/2014 0947   BILITOT 0.6 08/30/2014 1028   BILITOT 0.70 01/27/2012 0819        ASSESSMENT AND PLAN:   74 year old gentleman with the following issues:  1. Follicular Lymphoma: He has a long-standing history with multiple treatments outlined above. His last PET scan on 11/07/2014  have minimally changed in the last 2 years. He continues to be asymptomatic without any bulky lymphadenopathy. He does  have few vague complaints which is unlikely related to lymphoma recurrence. The plan is to repeat a PET scan in 2 months for documentation purposes. He does have symptoms of progression of disease, diffuse fullness therapy will be started.  2. Back pain and hip pain: Chronic in nature likely related to arthritis and possibly prior chemotherapy area manageable with anti-inflammatory disease.  3. Port-A-Cath management: He will continue to have this flushed every 2 months.   [facility] MD 02/27/2015

## 2015-02-27 NOTE — Telephone Encounter (Signed)
Gave and printed appt sched adn avs for pt for NOV °

## 2015-02-27 NOTE — Patient Instructions (Signed)

## 2015-02-28 ENCOUNTER — Encounter (HOSPITAL_COMMUNITY): Payer: Self-pay

## 2015-02-28 ENCOUNTER — Ambulatory Visit (HOSPITAL_COMMUNITY): Payer: Medicare Other | Attending: Cardiovascular Disease

## 2015-02-28 ENCOUNTER — Other Ambulatory Visit: Payer: Self-pay

## 2015-02-28 DIAGNOSIS — I428 Other cardiomyopathies: Secondary | ICD-10-CM | POA: Insufficient documentation

## 2015-02-28 DIAGNOSIS — I351 Nonrheumatic aortic (valve) insufficiency: Secondary | ICD-10-CM | POA: Diagnosis not present

## 2015-02-28 DIAGNOSIS — I429 Cardiomyopathy, unspecified: Secondary | ICD-10-CM | POA: Diagnosis not present

## 2015-02-28 MED ORDER — PERFLUTREN LIPID MICROSPHERE
2.0000 mL | Freq: Once | INTRAVENOUS | Status: AC
  Administered 2015-02-28: 2 mL via INTRAVENOUS

## 2015-02-28 NOTE — Progress Notes (Signed)
Sean Hodges (DOB: 04/26/1941) Post Definity 30 minute vital signs were BP 140/70, HR 65, O2 Sat 96% RA. .  The patient was discharged in stable conditiion without complaints.[name], [name]

## 2015-03-07 ENCOUNTER — Encounter: Payer: Self-pay | Admitting: Cardiology

## 2015-03-07 ENCOUNTER — Ambulatory Visit (INDEPENDENT_AMBULATORY_CARE_PROVIDER_SITE_OTHER): Payer: Medicare Other | Admitting: Cardiology

## 2015-03-07 VITALS — BP 128/74 | HR 74 | Ht 69.0 in | Wt 201.6 lb

## 2015-03-07 DIAGNOSIS — I429 Cardiomyopathy, unspecified: Secondary | ICD-10-CM | POA: Diagnosis not present

## 2015-03-07 DIAGNOSIS — E7800 Pure hypercholesterolemia, unspecified: Secondary | ICD-10-CM

## 2015-03-07 DIAGNOSIS — I428 Other cardiomyopathies: Secondary | ICD-10-CM | POA: Insufficient documentation

## 2015-03-07 DIAGNOSIS — I10 Essential (primary) hypertension: Secondary | ICD-10-CM | POA: Diagnosis not present

## 2015-03-07 HISTORY — DX: Other cardiomyopathies: I42.8

## 2015-03-07 NOTE — Patient Instructions (Signed)
Continue your current therapy  I will see you in 6 months.   

## 2015-03-07 NOTE — Progress Notes (Signed)
[name] Ends Date of Birth: [birth year] Medical Record #[id number]  History of Present Illness: Sean Hodges is seen for follow up nonischemic cardiomyopathy.  He has a history of low EF following chemotherapy with CHOP in the past.  He had a stress Echo at [facility] in March 2016. Marland Kitchen He was able to exercise for 8 minutes on the Bruce protocol. Normal HR, BP response. No ST changes. Resting EF was 40-45% but wall motion normalized with exercise to 65% with no wall motion abnormality. Since then he denies any  SOB. Infrequent chest pain localized to lower left parasternum- mild.  No edema.  He does have a history of NonHodgkin's lymphoma. He received CHOP chemotherapy in 2000. He had recurrence in 2008 and was treated with a different chemo regimen in 2008-09. There is evidence of recurrence based on  PET scan. This is felt to be low grade and is being followed conservatively for now.     Medication List       This list is accurate as of: 03/07/15 12:53 PM.  Always use your most recent med list.               aspirin EC 81 MG tablet  Take 81 mg by mouth daily.     calcium & magnesium carbonates 311-232 MG per tablet  Commonly known as:  MYLANTA  Take 1 tablet by mouth 2 (two) times daily.     COENZYME Q-10 PO  Take 50 mg by mouth 2 (two) times daily.     cyanocobalamin 1000 MCG tablet  Take 100 mcg by mouth daily.     enalapril 10 MG tablet  Commonly known as:  VASOTEC  Take 10 mg by mouth daily.     fish oil-omega-3 fatty acids 1000 MG capsule  Take 1 g by mouth daily.     glucosamine-chondroitin 500-400 MG tablet  Take 1 tablet by mouth 2 (two) times daily.     pravastatin 80 MG tablet  Commonly known as:  PRAVACHOL  Take 80 mg by mouth daily.     ranitidine 150 MG capsule  Commonly known as:  ZANTAC  Take 150 mg by mouth every evening.     saw palmetto 500 MG capsule  Take 500 mg by mouth 2 (two) times daily.     VITAMIN D3 PO  Take 500 Units by mouth  2 (two) times daily.         Allergies  Allergen Reactions  . Gabapentin Nausea Only and Other (See Comments)    dizziness  . Lipitor [Atorvastatin] Other (See Comments)    Myalgias     Past Medical History  Diagnosis Date  . Cancer     nhl  . Hypertension   . Hypercholesterolemia   . Arthritis     knee osteoarthritis  . Lymphoma   . Nonischemic cardiomyopathy 03/07/2015    Past Surgical History  Procedure Laterality Date  . Left eye surgery  2013  . Arthroscopies Bilateral   . Thyroidectomy, partial Left   . Vocal cord      polyps removed    Social History   Social History  . Marital Status: Married    Spouse Name: N/A  . Number of Children: 3  . Years of Education: N/A   Occupational History  . retired-engineer    Social History Main Topics  . Smoking status: Former Smoker    Quit date: 01/20/1974  . Smokeless tobacco: Former Systems developer  Quit date: 06/22/1973  . Alcohol Use: None  . Drug Use: None  . Sexual Activity: Not Asked   Other Topics Concern  . 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 3 children [place] in [place], 2 in [place]    Family History  Problem Relation Age of Onset  . Cancer Brother   . Cancer Brother   . Cancer Brother   . Cancer Brother   . Cancer Brother     Review of Systems: As noted in HPI. No fever, chills, weight loss.  Complains of bilateral leg pain in thighs, calves, and top of feet at night.  All other systems were reviewed and are negative.  Physical Exam: BP 128/74 mmHg  Pulse 74  Ht 5\' 9"  (1.753 m)  Wt 91.445 kg (201 lb 9.6 oz)  BMI 29.76 kg/m2 Filed Weights   03/07/15 0919  Weight: 91.445 kg (201 lb 9.6 oz)  GENERAL:  Well appearing WM in NAD HEENT:  PERRL, EOMI, sclera are clear. Oropharynx is clear. NECK:  No jugular venous distention, carotid upstroke  brisk and symmetric, no bruits, no thyromegaly or adenopathy LUNGS:  Clear to auscultation bilaterally CHEST:  There is chest wall tenderness to palpation.  HEART:  RRR,  PMI not displaced or sustained,S1 and S2 within normal limits, no S3, no S4: no clicks, no rubs, no murmurs ABD:  Soft, nontender. BS +, no masses or bruits. No hepatomegaly, no splenomegaly EXT:  2 + pulses throughout, no edema, no cyanosis no clubbing SKIN:  Warm and dry.  No rashes NEURO:  Alert and oriented x 3. Cranial nerves II through XII intact. PSYCH:  Cognitively intact    LABORATORY DATA:  Lab Results  Component Value Date   WBC 6.7 02/27/2015   HGB 14.9 02/27/2015   HCT 44.1 02/27/2015   PLT 165 02/27/2015   GLUCOSE 96 02/27/2015   CHOL 152 08/30/2014   TRIG 90 08/30/2014   HDL 39* 08/30/2014   LDLCALC 95 08/30/2014   ALT 13 02/27/2015   AST 9 02/27/2015   NA 142 02/27/2015   K 4.3 02/27/2015   CL 103 08/30/2014   CREATININE 1.1 02/27/2015   BUN 18.1 02/27/2015   CO2 28 02/27/2015   TSH 1.706 02/03/2008   Echo: 02/28/15:Study Conclusions  - Left ventricle: The cavity size was mildly dilated. Systolic function was normal. The estimated ejection fraction was in the range of 50% to 55%. Wall motion was normal; there were no regional wall motion abnormalities. - Aortic valve: There was mild regurgitation. - Left atrium: The atrium was mildly dilated. - Atrial septum: No defect or patent foramen ovale was identified.  Assessment / Plan: 1. Atypical chest pain. His history is more consistent with musculoskeletal pain. Stress echo showed no evidence of ischemia.   2. Nonischemic cardiomyopathy. EF 50-55% by recent  Echo. LV dysfunction  ? Related to prior chemo. No overt CHF. Will continue ACEi. If he does require further chemotherapy with cardiotoxic therapy I would recommend close follow up in CHF clinic.   3. Hyperlipidemia. On pravastatin.   4. NHL- followed by oncology.   5. HTN  continue ACEi.

## 2015-04-30 ENCOUNTER — Ambulatory Visit (HOSPITAL_BASED_OUTPATIENT_CLINIC_OR_DEPARTMENT_OTHER): Payer: Medicare Other

## 2015-04-30 ENCOUNTER — Ambulatory Visit (HOSPITAL_COMMUNITY)
Admission: RE | Admit: 2015-04-30 | Discharge: 2015-04-30 | Disposition: A | Discharge status: Home / Self Care | Payer: Medicare Other | Source: Ambulatory Visit | Attending: Oncology | Admitting: Oncology

## 2015-04-30 ENCOUNTER — Other Ambulatory Visit (HOSPITAL_BASED_OUTPATIENT_CLINIC_OR_DEPARTMENT_OTHER): Payer: Medicare Other

## 2015-04-30 DIAGNOSIS — I2510 Atherosclerotic heart disease of native coronary artery without angina pectoris: Secondary | ICD-10-CM | POA: Diagnosis not present

## 2015-04-30 DIAGNOSIS — N281 Cyst of kidney, acquired: Secondary | ICD-10-CM | POA: Insufficient documentation

## 2015-04-30 DIAGNOSIS — M25451 Effusion, right hip: Secondary | ICD-10-CM | POA: Diagnosis not present

## 2015-04-30 DIAGNOSIS — C8290 Follicular lymphoma, unspecified, unspecified site: Secondary | ICD-10-CM

## 2015-04-30 DIAGNOSIS — Z95828 Presence of other vascular implants and grafts: Secondary | ICD-10-CM

## 2015-04-30 DIAGNOSIS — K760 Fatty (change of) liver, not elsewhere classified: Secondary | ICD-10-CM | POA: Diagnosis not present

## 2015-04-30 DIAGNOSIS — I700 Atherosclerosis of aorta: Secondary | ICD-10-CM | POA: Diagnosis not present

## 2015-04-30 DIAGNOSIS — R918 Other nonspecific abnormal finding of lung field: Secondary | ICD-10-CM | POA: Insufficient documentation

## 2015-04-30 LAB — CBC WITH DIFFERENTIAL/PLATELET
BASO%: 1.1 % (ref 0.0–2.0)
Basophils Absolute: 0.1 10*3/uL (ref 0.0–0.1)
EOS%: 3.9 % (ref 0.0–7.0)
Eosinophils Absolute: 0.2 10*3/uL (ref 0.0–0.5)
HCT: 43.2 % (ref 38.4–49.9)
HGB: 14.6 g/dL (ref 13.0–17.1)
LYMPH%: 24.5 % (ref 14.0–49.0)
MCH: 28.8 pg (ref 27.2–33.4)
MCHC: 33.7 g/dL (ref 32.0–36.0)
MCV: 85.5 fL (ref 79.3–98.0)
MONO#: 0.6 10*3/uL (ref 0.1–0.9)
MONO%: 10.7 % (ref 0.0–14.0)
NEUT#: 3.2 10*3/uL (ref 1.5–6.5)
NEUT%: 59.8 % (ref 39.0–75.0)
Platelets: 176 10*3/uL (ref 140–400)
RBC: 5.06 10*6/uL (ref 4.20–5.82)
RDW: 14 % (ref 11.0–14.6)
WBC: 5.3 10*3/uL (ref 4.0–10.3)
lymph#: 1.3 10*3/uL (ref 0.9–3.3)

## 2015-04-30 LAB — COMPREHENSIVE METABOLIC PANEL (CC13)
ALT: 13 U/L (ref 0–55)
AST: 11 U/L (ref 5–34)
Albumin: 3.8 g/dL (ref 3.5–5.0)
Alkaline Phosphatase: 47 U/L (ref 40–150)
Anion Gap: 7 mEq/L (ref 3–11)
BUN: 19.7 mg/dL (ref 7.0–26.0)
CO2: 26 mEq/L (ref 22–29)
Calcium: 9.2 mg/dL (ref 8.4–10.4)
Chloride: 107 mEq/L (ref 98–109)
Creatinine: 1.1 mg/dL (ref 0.7–1.3)
EGFR: 66 mL/min/{1.73_m2} — ABNORMAL LOW (ref >90)
Glucose: 99 mg/dl (ref 70–140)
Potassium: 4.4 mEq/L (ref 3.5–5.1)
Sodium: 141 mEq/L (ref 136–145)
Total Bilirubin: 0.56 mg/dL (ref 0.20–1.20)
Total Protein: 6.4 g/dL (ref 6.4–8.3)

## 2015-04-30 LAB — GLUCOSE, CAPILLARY: Glucose-Capillary: 101 mg/dL — ABNORMAL HIGH (ref 65–99)

## 2015-04-30 MED ORDER — FLUDEOXYGLUCOSE F - 18 (FDG) INJECTION
11.2000 | Freq: Once | INTRAVENOUS | Status: DC | PRN
  Administered 2015-04-30: 11.2 via INTRAVENOUS
  Filled 2015-04-30: qty 11.2

## 2015-04-30 MED ORDER — SODIUM CHLORIDE 0.9 % IJ SOLN
10.0000 mL | INTRAMUSCULAR | Status: DC | PRN
  Administered 2015-04-30: 10 mL via INTRAVENOUS
  Filled 2015-04-30: qty 10

## 2015-04-30 NOTE — Patient Instructions (Signed)

## 2015-05-02 ENCOUNTER — Ambulatory Visit (HOSPITAL_BASED_OUTPATIENT_CLINIC_OR_DEPARTMENT_OTHER): Payer: Medicare Other | Admitting: Oncology

## 2015-05-02 ENCOUNTER — Telehealth: Payer: Self-pay | Admitting: Oncology

## 2015-05-02 VITALS — BP 130/67 | HR 65 | Temp 97.9°F | Resp 17 | Ht 69.0 in | Wt 204.8 lb

## 2015-05-02 DIAGNOSIS — M25559 Pain in unspecified hip: Secondary | ICD-10-CM

## 2015-05-02 DIAGNOSIS — C8298 Follicular lymphoma, unspecified, lymph nodes of multiple sites: Secondary | ICD-10-CM | POA: Diagnosis not present

## 2015-05-02 DIAGNOSIS — M549 Dorsalgia, unspecified: Secondary | ICD-10-CM

## 2015-05-02 DIAGNOSIS — C8200 Follicular lymphoma grade I, unspecified site: Secondary | ICD-10-CM

## 2015-05-02 NOTE — Telephone Encounter (Signed)
per pof to sch pt appt-gave pt copy of avs °

## 2015-05-02 NOTE — Progress Notes (Signed)
[facility] PROGRESS NOTE 05/02/2015   Sean Hodges Sean Hodges., MD [address] [place] [address]  Principle diagnosis: 73 year old gentleman Follicular Lymphoma. He was initially diagnosed in June 2000 after presenting with thyroid nodule. He is status post multiple therapies since that time.  PAST THERAPY:  His past 6 cycles of CHOP between July and November 2000.  Weekly Rituxan x 8 weeks. He stayed in remission until August 2008 when PET/CT showed recurrence and biopsy showed a grade I FL.  He received 6 cycles of R-CVP between September 2008 through January 2009.  Then he relocated to [place], [place].He was offered Rituxan monotherapy every 2 months x 2 years by Sean Hodges and got 1 dose but then Sean Hodges stopped after 1 dose and plan was to treat him only once he had a recurrence.  He stayed in remission until October 2014. Recent scan shows slight progression of disease.   CURRENT THERAPY: Observation and surveillance.  INTERVAL HISTORY:  Sean Hodges presents for follow-up visit. Since the last visit, he reports doing well at this time. He has few complaints related to arthritis in his knees as well as his hips. He is currently under evaluation for possible spinal stenosis. Otherwise his performance status and activity level have not really changed. He does have chronic headaches and dizziness related to inner ear infection that has been chronic.   He denies any fevers.  He denies any weight changes. He denies any abdominal pain. He has not reported any early satiety or bulky adenopathy. His weight actually has improved since the last visit.  He does not report any blurry vision or double vision. He does not report chest pain, palpitation, leg edema. He does not report any shortness of breath, cough or hemoptysis or hematemesis. Does not report any nausea, vomiting, abdominal pain, hematochezia, melena. Does not report any neurological deficits. She does  not report any lymphadenopathy, petechiae or bleeding. Rest of his review of systems unremarkable.    ALLERGIES:  is allergic to gabapentin and lipitor.  MEDICATIONS:   Current Outpatient Prescriptions  Medication Sig Dispense Refill  . aspirin EC 81 MG tablet Take 81 mg by mouth daily.    . calcium & magnesium carbonates (MYLANTA) 311-232 MG per tablet Take 1 tablet by mouth 2 (two) times daily.    . Cholecalciferol (VITAMIN D3 PO) Take 500 Units by mouth 2 (two) times daily.    Marland Kitchen COENZYME Q-10 PO Take 50 mg by mouth 2 (two) times daily.     . cyanocobalamin 1000 MCG tablet Take 100 mcg by mouth daily.    . enalapril (VASOTEC) 10 MG tablet Take 10 mg by mouth daily.  0  . fish oil-omega-3 fatty acids 1000 MG capsule Take 1 g by mouth daily.    Marland Kitchen glucosamine-chondroitin 500-400 MG tablet Take 1 tablet by mouth 2 (two) times daily.    . pravastatin (PRAVACHOL) 80 MG tablet Take 80 mg by mouth daily.   1  . ranitidine (ZANTAC) 150 MG capsule Take 150 mg by mouth every evening.    . saw palmetto 500 MG capsule Take 500 mg by mouth 2 (two) times daily.     No current facility-administered medications for this visit.   Facility-Administered Medications Ordered in Other Visits  Medication Dose Route Frequency Provider Last Rate Last Dose  . fludeoxyglucose F - 18 (FDG) injection 11.2 milli Curie  11.2 milli Curie Intravenous Once PRN Sean Portela, MD  11.2 milli Curie at 04/30/15 1036     PHYSICAL EXAMINATION: ECOG PERFORMANCE STATUS: 0  Blood pressure 130/67, pulse 65, temperature 97.9 F (36.6 C), temperature source Oral, resp. rate 17, height 5\' 9"  (1.753 m), weight 204 lb 12.8 oz (92.897 kg), SpO2 97 %. ECOG 1 GENERAL:alert, awake gentleman without distress. SKIN: skin color, texture, turgor are normal.  EYES: normal, Conjunctiva are pink and non-injected, sclera clear OROPHARYNX: No oral thrush. NECK: supple, thyroid normal size, non-tender. LYMPH:  no palpable  lymphadenopathy in the cervical, axillary, inguinal or supraclavicular LUNGS: clear to auscultation and percussion with normal breathing effort no dullness to percussion. HEART: regular rate & rhythm and no murmurs and no lower extremity edema ABDOMEN:abdomen soft, non-tender and normal bowel sounds no shifting dullness or ascites. I cannot appreciate an enlarged spleen. Musculoskeletal:no cyanosis of digits and no clubbing  NEURO: alert & oriented x 3 with fluent speech, no focal motor/sensory deficits   Labs:  Lab Results  Component Value Date   WBC 5.3 04/30/2015   HGB 14.6 04/30/2015   HCT 43.2 04/30/2015   MCV 85.5 04/30/2015   PLT 176 04/30/2015   NEUTROABS 3.2 04/30/2015      Chemistry      Component Value Date/Time   NA 141 04/30/2015 0924   NA 140 08/30/2014 1028   NA 138 01/27/2012 0819   K 4.4 04/30/2015 0924   K 4.6 08/30/2014 1028   K 4.5 01/27/2012 0819   CL 103 08/30/2014 1028   CL 104 09/23/2012 1104   CL 99 01/27/2012 0819   CO2 26 04/30/2015 0924   CO2 31 08/30/2014 1028   CO2 28 01/27/2012 0819   BUN 19.7 04/30/2015 0924   BUN 20 08/30/2014 1028   BUN 19 01/27/2012 0819   CREATININE 1.1 04/30/2015 0924   CREATININE 1.25 08/30/2014 1028   CREATININE 1.06 08/11/2010 1013      Component Value Date/Time   CALCIUM 9.2 04/30/2015 0924   CALCIUM 8.8 08/30/2014 1028   CALCIUM 9.0 01/27/2012 0819   ALKPHOS 47 04/30/2015 0924   ALKPHOS 45 08/30/2014 1028   ALKPHOS 47 01/27/2012 0819   AST 11 04/30/2015 0924   AST 9 08/30/2014 1028   AST 19 01/27/2012 0819   ALT 13 04/30/2015 0924   ALT 12 08/30/2014 1028   ALT 24 01/27/2012 0819   BILITOT 0.56 04/30/2015 0924   BILITOT 0.6 08/30/2014 1028   BILITOT 0.70 01/27/2012 0819      EXAM: NUCLEAR MEDICINE PET SKULL BASE TO THIGH  TECHNIQUE: 11.2 mCi F-18 FDG was injected intravenously. Full-ring PET imaging was performed from the skull base to thigh after the radiotracer. CT data was obtained and  used for attenuation correction and anatomic localization.  FASTING BLOOD GLUCOSE: Value: 101 mg/dl  COMPARISON: 11/07/2014 PET-CT.  FINDINGS: NECK  There is new mild hypermetabolism with a nonenlarged 0.6 cm left level 2 cervical node (series 4/ image 31) with max SUV 4.0. No additional hypermetabolic neck nodes. Stable atrophy or surgical absence of the left thyroid lobe.  CHEST  There are no hypermetabolic axillary nodes. There is new borderline hypermetabolism within a mildly enlarged 1.2 cm subcarinal node (4/69) with max SUV 3.8. No hypermetabolic hilar nodes.  Right subclavian MediPort terminates in the lower third of the superior vena cava. Stable top-normal heart size. There is atherosclerosis of the thoracic aorta, the great vessels of the mediastinum and the coronary arteries, including calcified atherosclerotic plaque in the left anterior descending and right  coronary arteries. Stable 0.3 cm posterior right upper lobe solid pulmonary nodule (series 6/ image 19), below PET resolution. Stable 4 mm anterior left lower lobe solid pulmonary nodule (6/46), below PET resolution. No acute consolidative airspace disease, new significant pulmonary nodules or lung masses.  ABDOMEN/PELVIS  There is a newly hypermetabolic mildly enlarged 1.0 cm porta hepatis node (4/108) with max SUV 5.1. There is a hypermetabolic mildly enlarged 1.5 cm portacaval node (4/113) with max SUV 5.6, previously 5.3, minimally increased.  There is a moderately enlarged 1.9 cm hypermetabolic left para-aortic node (4/129) with max SUV 5.0, previously 1.8 cm with max SUV 4.2, increased.  There is a moderately enlarged hypermetabolic 2.2 cm right external iliac node (4/173) with max SUV 7.7, previously 2.0 cm with max SUV 5.9, increased.  There is a newly hypermetabolic 1.5 cm left external iliac node (4/173) with max SUV 6.1, previously 1.0 cm in size.  There is a hypermetabolic  1.2 cm right inguinal node (4/199) with max SUV 7.1, previously 0.9 cm with max SUV 3.5, increased.  No abnormal hypermetabolic activity within the liver, pancreas, adrenal glands, or spleen. Heterogeneous hepatic steatosis. Simple 2.6 cm medial upper right renal cyst. Stable mild prostatomegaly with nonspecific internal prostatic calcification. Atherosclerotic nonaneurysmal abdominal aorta.  SKELETON  No focal hypermetabolic activity to suggest skeletal metastasis. There is new hypermetabolism and joint effusion in the right hip joint without obvious cortical erosions.  IMPRESSION: 1. Interval metabolic progression of lymphoma in the retroperitoneum and bilateral pelvis. New mild hypermetabolism with left neck and subcarinal mediastinal nodes could also represent progressive lymphoma. 2. New hyper metabolism and joint effusion in the right hip joint without obvious cortical erosions. These findings are nonspecific and could represent progressive degenerative arthritis, although an infectious or inflammatory right hip arthritis cannot be excluded.   ASSESSMENT AND PLAN:   74 year old gentleman with the following issues:  1. Follicular Lymphoma: His initial diagnosis dates back to your 2000 and had multiple therapies to most recent of which is in 2014. This therapy include CHOP, CVP and single agent rituximab. He had not received bendamustine based regimen. His most recent PET/CT scan on 04/30/2015 was reviewed personally and discussed with the patient. He does have enlarged lymphadenopathy that mildly progressed since previous imaging studies. These lymph glands are not bulky and have not dramatically changed. He is completely asymptomatic at this time.  The risks and benefits of using systemic chemotherapy now versus at a later date was reviewed. Given the slow pace of his disease as well as the fact that he is completely asymptomatic. He decided to defer therapy to a later date  when and if he develops symptoms. I continue to educate him about the symptoms and include fevers, chills, bulky adenopathy, early satiety or other constitutional symptoms. We will also continue to follow his imaging studies and if he develops bulky adenopathy were enlarging adenopathy that causes organ dysfunction we will intervene at that time.  He will follow-up in 4 months for clinical visit and laboratory testing and repeat imaging studies in 8 months. This will be done sooner if he develops any symptoms.  2. Back pain and hip pain: Chronic in nature likely related to arthritis. His imaging studies did not show any evidence of lymphoma involvement in the osseous structures..  3. Port-A-Cath management: He will continue to have this flushed every 2 months.   [facility] MD 05/02/2015

## 2015-06-03 ENCOUNTER — Telehealth: Payer: Self-pay | Admitting: *Deleted

## 2015-06-03 NOTE — Telephone Encounter (Signed)
Patient called and stated,"I'm having back surgery on 06/10/15. Dr. Rennis Harding wants to make sure its OK with Dr. Alen Blew that since, I'll be lying on my stomach, it won't hurt the port-a-cath? He needs a written letter stating that. Dr. Patrice Paradise works at [employer]. Fax # is [phone]. Instructed patient that Dr. Alen Blew is not in the office today, but I will let him know tomorrow. Patient verbalized understanding.

## 2015-06-04 ENCOUNTER — Encounter: Payer: Self-pay | Admitting: *Deleted

## 2015-06-04 NOTE — Telephone Encounter (Signed)
This RN faxed a letter regarding back surgery to Dr. Rennis Harding at the [facility]. Fax # is [phone].

## 2015-06-28 DIAGNOSIS — R339 Retention of urine, unspecified: Secondary | ICD-10-CM | POA: Diagnosis not present

## 2015-07-02 ENCOUNTER — Telehealth: Payer: Self-pay | Admitting: Oncology

## 2015-07-02 ENCOUNTER — Other Ambulatory Visit: Payer: Medicare Other

## 2015-07-02 NOTE — Telephone Encounter (Signed)
Patient called to r/s 01/10 appt due to weather.  01/13 @ 11 lab 01/13-@ 11:30 Flush

## 2015-07-03 DIAGNOSIS — R339 Retention of urine, unspecified: Secondary | ICD-10-CM | POA: Diagnosis not present

## 2015-07-05 ENCOUNTER — Ambulatory Visit (HOSPITAL_BASED_OUTPATIENT_CLINIC_OR_DEPARTMENT_OTHER): Payer: PPO

## 2015-07-05 ENCOUNTER — Other Ambulatory Visit (HOSPITAL_BASED_OUTPATIENT_CLINIC_OR_DEPARTMENT_OTHER): Payer: PPO

## 2015-07-05 DIAGNOSIS — C8200 Follicular lymphoma grade I, unspecified site: Secondary | ICD-10-CM

## 2015-07-05 DIAGNOSIS — C8298 Follicular lymphoma, unspecified, lymph nodes of multiple sites: Secondary | ICD-10-CM

## 2015-07-05 DIAGNOSIS — Z95828 Presence of other vascular implants and grafts: Secondary | ICD-10-CM

## 2015-07-05 LAB — COMPREHENSIVE METABOLIC PANEL
ALT: 13 U/L (ref 0–55)
AST: 8 U/L (ref 5–34)
Albumin: 3.6 g/dL (ref 3.5–5.0)
Alkaline Phosphatase: 82 U/L (ref 40–150)
Anion Gap: 10 mEq/L (ref 3–11)
BUN: 20.5 mg/dL (ref 7.0–26.0)
CO2: 27 mEq/L (ref 22–29)
Calcium: 9.4 mg/dL (ref 8.4–10.4)
Chloride: 102 mEq/L (ref 98–109)
Creatinine: 1.2 mg/dL (ref 0.7–1.3)
EGFR: 61 mL/min/{1.73_m2} — ABNORMAL LOW (ref >90)
Glucose: 97 mg/dl (ref 70–140)
Potassium: 4.4 mEq/L (ref 3.5–5.1)
Sodium: 139 mEq/L (ref 136–145)
Total Bilirubin: 0.48 mg/dL (ref 0.20–1.20)
Total Protein: 6.8 g/dL (ref 6.4–8.3)

## 2015-07-05 LAB — CBC WITH DIFFERENTIAL/PLATELET
BASO%: 0.5 % (ref 0.0–2.0)
Basophils Absolute: 0 10*3/uL (ref 0.0–0.1)
EOS%: 3 % (ref 0.0–7.0)
Eosinophils Absolute: 0.3 10*3/uL (ref 0.0–0.5)
HCT: 37.9 % — ABNORMAL LOW (ref 38.4–49.9)
HGB: 12.8 g/dL — ABNORMAL LOW (ref 13.0–17.1)
LYMPH%: 15.5 % (ref 14.0–49.0)
MCH: 28.8 pg (ref 27.2–33.4)
MCHC: 33.8 g/dL (ref 32.0–36.0)
MCV: 85.2 fL (ref 79.3–98.0)
MONO#: 1.1 10*3/uL — ABNORMAL HIGH (ref 0.1–0.9)
MONO%: 13.1 % (ref 0.0–14.0)
NEUT#: 5.7 10*3/uL (ref 1.5–6.5)
NEUT%: 67.9 % (ref 39.0–75.0)
Platelets: 207 10*3/uL (ref 140–400)
RBC: 4.45 10*6/uL (ref 4.20–5.82)
RDW: 13.4 % (ref 11.0–14.6)
WBC: 8.3 10*3/uL (ref 4.0–10.3)
lymph#: 1.3 10*3/uL (ref 0.9–3.3)
nRBC: 0 % (ref 0–0)

## 2015-07-05 MED ORDER — HEPARIN SOD (PORK) LOCK FLUSH 100 UNIT/ML IV SOLN
500.0000 [IU] | Freq: Once | INTRAVENOUS | Status: AC
  Administered 2015-07-05: 500 [IU] via INTRAVENOUS
  Filled 2015-07-05: qty 5

## 2015-07-05 MED ORDER — SODIUM CHLORIDE 0.9 % IJ SOLN
10.0000 mL | INTRAMUSCULAR | Status: DC | PRN
  Administered 2015-07-05: 10 mL via INTRAVENOUS
  Filled 2015-07-05: qty 10

## 2015-07-08 DIAGNOSIS — M4316 Spondylolisthesis, lumbar region: Secondary | ICD-10-CM | POA: Diagnosis not present

## 2015-07-12 DIAGNOSIS — Z9181 History of falling: Secondary | ICD-10-CM | POA: Diagnosis not present

## 2015-07-12 DIAGNOSIS — Z0000 Encounter for general adult medical examination without abnormal findings: Secondary | ICD-10-CM | POA: Diagnosis not present

## 2015-07-15 DIAGNOSIS — N5201 Erectile dysfunction due to arterial insufficiency: Secondary | ICD-10-CM | POA: Diagnosis not present

## 2015-07-15 DIAGNOSIS — N401 Enlarged prostate with lower urinary tract symptoms: Secondary | ICD-10-CM | POA: Diagnosis not present

## 2015-07-15 DIAGNOSIS — R339 Retention of urine, unspecified: Secondary | ICD-10-CM | POA: Diagnosis not present

## 2015-09-02 ENCOUNTER — Encounter: Payer: Self-pay | Admitting: *Deleted

## 2015-09-02 ENCOUNTER — Other Ambulatory Visit: Payer: Self-pay | Admitting: *Deleted

## 2015-09-02 DIAGNOSIS — C8200 Follicular lymphoma grade I, unspecified site: Secondary | ICD-10-CM

## 2015-09-03 ENCOUNTER — Other Ambulatory Visit (HOSPITAL_BASED_OUTPATIENT_CLINIC_OR_DEPARTMENT_OTHER): Payer: PPO

## 2015-09-03 ENCOUNTER — Ambulatory Visit (HOSPITAL_BASED_OUTPATIENT_CLINIC_OR_DEPARTMENT_OTHER): Payer: PPO

## 2015-09-03 ENCOUNTER — Ambulatory Visit (HOSPITAL_BASED_OUTPATIENT_CLINIC_OR_DEPARTMENT_OTHER): Payer: PPO | Admitting: Oncology

## 2015-09-03 ENCOUNTER — Telehealth: Payer: Self-pay | Admitting: Oncology

## 2015-09-03 VITALS — BP 144/81 | HR 70 | Temp 98.4°F | Resp 18 | Ht 69.0 in | Wt 201.0 lb

## 2015-09-03 DIAGNOSIS — M549 Dorsalgia, unspecified: Secondary | ICD-10-CM

## 2015-09-03 DIAGNOSIS — M25559 Pain in unspecified hip: Secondary | ICD-10-CM

## 2015-09-03 DIAGNOSIS — C8290 Follicular lymphoma, unspecified, unspecified site: Secondary | ICD-10-CM

## 2015-09-03 DIAGNOSIS — Z95828 Presence of other vascular implants and grafts: Secondary | ICD-10-CM

## 2015-09-03 DIAGNOSIS — C8200 Follicular lymphoma grade I, unspecified site: Secondary | ICD-10-CM

## 2015-09-03 LAB — COMPREHENSIVE METABOLIC PANEL
ALT: 15 U/L (ref 0–55)
AST: 11 U/L (ref 5–34)
Albumin: 4 g/dL (ref 3.5–5.0)
Alkaline Phosphatase: 58 U/L (ref 40–150)
Anion Gap: 8 mEq/L (ref 3–11)
BUN: 16.5 mg/dL (ref 7.0–26.0)
CO2: 27 mEq/L (ref 22–29)
Calcium: 9.3 mg/dL (ref 8.4–10.4)
Chloride: 104 mEq/L (ref 98–109)
Creatinine: 1.1 mg/dL (ref 0.7–1.3)
EGFR: 64 mL/min/{1.73_m2} — ABNORMAL LOW (ref >90)
Glucose: 93 mg/dl (ref 70–140)
Potassium: 4.4 mEq/L (ref 3.5–5.1)
Sodium: 139 mEq/L (ref 136–145)
Total Bilirubin: 0.51 mg/dL (ref 0.20–1.20)
Total Protein: 6.9 g/dL (ref 6.4–8.3)

## 2015-09-03 LAB — CBC WITH DIFFERENTIAL/PLATELET
BASO%: 0.9 % (ref 0.0–2.0)
Basophils Absolute: 0.1 10*3/uL (ref 0.0–0.1)
EOS%: 3.3 % (ref 0.0–7.0)
Eosinophils Absolute: 0.2 10*3/uL (ref 0.0–0.5)
HCT: 42.8 % (ref 38.4–49.9)
HGB: 14.2 g/dL (ref 13.0–17.1)
LYMPH%: 21.3 % (ref 14.0–49.0)
MCH: 27.9 pg (ref 27.2–33.4)
MCHC: 33.2 g/dL (ref 32.0–36.0)
MCV: 83.9 fL (ref 79.3–98.0)
MONO#: 0.7 10*3/uL (ref 0.1–0.9)
MONO%: 11.3 % (ref 0.0–14.0)
NEUT#: 4 10*3/uL (ref 1.5–6.5)
NEUT%: 63.2 % (ref 39.0–75.0)
Platelets: 201 10*3/uL (ref 140–400)
RBC: 5.1 10*6/uL (ref 4.20–5.82)
RDW: 14.6 % (ref 11.0–14.6)
WBC: 6.4 10*3/uL (ref 4.0–10.3)
lymph#: 1.4 10*3/uL (ref 0.9–3.3)

## 2015-09-03 MED ORDER — SODIUM CHLORIDE 0.9% FLUSH
10.0000 mL | INTRAVENOUS | Status: DC | PRN
  Administered 2015-09-03: 10 mL via INTRAVENOUS
  Filled 2015-09-03: qty 10

## 2015-09-03 MED ORDER — HEPARIN SOD (PORK) LOCK FLUSH 100 UNIT/ML IV SOLN
500.0000 [IU] | Freq: Once | INTRAVENOUS | Status: AC
  Administered 2015-09-03: 500 [IU] via INTRAVENOUS
  Filled 2015-09-03: qty 5

## 2015-09-03 NOTE — Patient Instructions (Signed)

## 2015-09-03 NOTE — Progress Notes (Signed)
[place] [facility] OFF[facility] PROGRESS NOTE 09/03/2015   Sean Landsman Angelique Blonder., MD [address] [place] [address]  Principle diagnosis: 75 year old gentleman Follicular Lymphoma. He was initially diagnosed in June 2000 after presenting with thyroid nodule. He is status post multiple therapies since that time.  PAST THERAPY:  His past 6 cycles of CHOP between July and November 2000.  Weekly Rituxan x 8 weeks. He stayed in remission until August 2008 when PET/CT showed recurrence and biopsy showed a grade I FL.  He received 6 cycles of R-CVP between September 2008 through January 2009.  Then he relocated to [place], [place].He was offered Rituxan monotherapy every 2 months x 2 years by Dr Ralene Ok and got 1 dose but then Dr Juliann Mule stopped after 1 dose and plan was to treat him only once he had a recurrence.  He stayed in remission until October 2014. Recent scan shows slight progression of disease.   CURRENT THERAPY: Observation and surveillance.  INTERVAL HISTORY: Sean Hodges presents for follow-up visit with his wife. Since the last visit, he underwent back operation and [facility] in December 2016 and is recovering well at this time. He did have minor postoperative infection and currently recovered. He is ambulating without any major difficulties but have not resumed driving. His appetite have been reasonable without any major weight loss. He did lose a few pounds around the operation which have regained it at this time.   He denies any fevers. He denies any weight changes. He denies any abdominal pain. He has not reported any early satiety or bulky adenopathy. He is not any other constitutional symptoms.  He does not report any blurry vision or double vision. He does not report chest pain, palpitation, leg edema. He does not report any shortness of breath, cough or hemoptysis or hematemesis. Does not report any nausea, vomiting, abdominal pain, hematochezia,  melena. Does not report any neurological deficits. She does not report any lymphadenopathy, petechiae or bleeding. Rest of his review of systems unremarkable.    ALLERGIES:  is allergic to gabapentin and lipitor.  MEDICATIONS:   Current Outpatient Prescriptions  Medication Sig Dispense Refill  . aspirin EC 81 MG tablet Take 81 mg by mouth daily.    . calcium & magnesium carbonates (MYLANTA) 311-232 MG per tablet Take 1 tablet by mouth 2 (two) times daily.    . Cholecalciferol (VITAMIN D3 PO) Take 500 Units by mouth 2 (two) times daily.    Marland Kitchen COENZYME Q-10 PO Take 50 mg by mouth 2 (two) times daily.     . cyanocobalamin 1000 MCG tablet Take 100 mcg by mouth daily.    . enalapril (VASOTEC) 10 MG tablet Take 10 mg by mouth daily.  0  . fish oil-omega-3 fatty acids 1000 MG capsule Take 1 g by mouth daily.    Marland Kitchen glucosamine-chondroitin 500-400 MG tablet Take 1 tablet by mouth 2 (two) times daily.    . pravastatin (PRAVACHOL) 80 MG tablet Take 80 mg by mouth daily.   1  . ranitidine (ZANTAC) 150 MG capsule Take 150 mg by mouth every evening.    . saw palmetto 500 MG capsule Take 500 mg by mouth 2 (two) times daily.     No current facility-administered medications for this visit.     PHYSICAL EXAMINATION: ECOG PERFORMANCE STATUS: 0  Blood pressure 144/81, pulse 70, temperature 98.4 F (36.9 C), temperature source Oral, resp. rate 18, height 5\' 9"  (1.753 m), weight 201 lb (  91.173 kg), SpO2 97 %. ECOG 1 GENERAL:alert, awake Healthy-appearing gentleman without distress. SKIN: skin color, texture, turgor are normal.  EYES: Conjunctiva are pink and non-injected, sclera clear OROPHARYNX: No oral thrush. No ulcers or lesions noted. NECK: supple, thyroid normal size, non-tender. LYMPH:  no palpable lymphadenopathy in the cervical, axillary, inguinal or supraclavicular LUNGS: clear to auscultation and percussion with normal breathing effort no dullness to percussion. HEART: regular rate & rhythm  and no murmurs and no lower extremity edema ABDOMEN:abdomen soft, non-tender and normal bowel sounds no shifting dullness or ascites. No organomegaly noted. Musculoskeletal:no cyanosis of digits and no clubbing  NEURO: alert & oriented x 3 with fluent speech, no focal motor/sensory deficits   Labs:  Lab Results  Component Value Date   WBC 6.4 09/03/2015   HGB 14.2 09/03/2015   HCT 42.8 09/03/2015   MCV 83.9 09/03/2015   PLT 201 09/03/2015   NEUTROABS 4.0 09/03/2015      Chemistry      Component Value Date/Time   NA 139 07/05/2015 1048   NA 140 08/30/2014 1028   NA 138 01/27/2012 0819   K 4.4 07/05/2015 1048   K 4.6 08/30/2014 1028   K 4.5 01/27/2012 0819   CL 103 08/30/2014 1028   CL 104 09/23/2012 1104   CL 99 01/27/2012 0819   CO2 27 07/05/2015 1048   CO2 31 08/30/2014 1028   CO2 28 01/27/2012 0819   BUN 20.5 07/05/2015 1048   BUN 20 08/30/2014 1028   BUN 19 01/27/2012 0819   CREATININE 1.2 07/05/2015 1048   CREATININE 1.25 08/30/2014 1028   CREATININE 1.06 08/11/2010 1013      Component Value Date/Time   CALCIUM 9.4 07/05/2015 1048   CALCIUM 8.8 08/30/2014 1028   CALCIUM 9.0 01/27/2012 0819   ALKPHOS 82 07/05/2015 1048   ALKPHOS 45 08/30/2014 1028   ALKPHOS 47 01/27/2012 0819   AST 8 07/05/2015 1048   AST 9 08/30/2014 1028   AST 19 01/27/2012 0819   ALT 13 07/05/2015 1048   ALT 12 08/30/2014 1028   ALT 24 01/27/2012 0819   BILITOT 0.48 07/05/2015 1048   BILITOT 0.6 08/30/2014 1028   BILITOT 0.70 01/27/2012 0819      ASSESSMENT AND PLAN:   75 year old gentleman with the following issues:  1. Follicular Lymphoma: His initial diagnosis dates back to your 2000 and had multiple therapies to most recent of which is in 2014. This therapy include CHOP, CVP and single agent rituximab. He had not received bendamustine based regimen.     PET/CT scan on 04/30/2015 did showenlarged lymphadenopathy that mildly progressed since previous imaging studies. These  lymph glands are not bulky and have not dramatically changed. He is completely asymptomatic at this time and have elected to continue observation and surveillance.  The plan is to repeat PET/CT scan in July 2017. If his disease progresses and is symptomatic from his disease, salvage therapy will be utilized. It'll be a good candidate for bendamustine and rituximab. If his disease remains relatively stable, we'll continued observation.    2. Back pain and hip pain: He is status post spinal stenosis operation which have recovered well at this time.  3. Port-A-Cath management: He will continue to have this flushed every 2 months.  4. Follow-up: Will be in 4 months for a visit and discuss his PET CT scan.   [facility] MD 09/03/2015

## 2015-09-03 NOTE — Telephone Encounter (Signed)
per pof to sch pt appt-gave pt copy of avs °

## 2015-09-04 DIAGNOSIS — M4326 Fusion of spine, lumbar region: Secondary | ICD-10-CM | POA: Diagnosis not present

## 2015-09-04 DIAGNOSIS — M5106 Intervertebral disc disorders with myelopathy, lumbar region: Secondary | ICD-10-CM | POA: Diagnosis not present

## 2015-09-04 DIAGNOSIS — M7061 Trochanteric bursitis, right hip: Secondary | ICD-10-CM | POA: Diagnosis not present

## 2015-10-16 ENCOUNTER — Ambulatory Visit (INDEPENDENT_AMBULATORY_CARE_PROVIDER_SITE_OTHER): Payer: PPO | Admitting: Cardiology

## 2015-10-16 ENCOUNTER — Encounter: Payer: Self-pay | Admitting: Cardiology

## 2015-10-16 VITALS — BP 135/78 | HR 65 | Ht 69.0 in | Wt 194.4 lb

## 2015-10-16 DIAGNOSIS — I428 Other cardiomyopathies: Secondary | ICD-10-CM

## 2015-10-16 DIAGNOSIS — I10 Essential (primary) hypertension: Secondary | ICD-10-CM

## 2015-10-16 DIAGNOSIS — E7800 Pure hypercholesterolemia, unspecified: Secondary | ICD-10-CM | POA: Diagnosis not present

## 2015-10-16 DIAGNOSIS — I429 Cardiomyopathy, unspecified: Secondary | ICD-10-CM

## 2015-10-16 NOTE — Patient Instructions (Signed)
Continue your current therapy  I will see you in 6 months.   

## 2015-10-16 NOTE — Progress Notes (Signed)
[name] Ends Date of Birth: 1941-06-14 Medical Record [id number]  History of Present Illness: Mr. Sean Hodges is seen for follow up nonischemic cardiomyopathy.  He has a history of low EF following chemotherapy with CHOP in the past.  He had a stress Echo at [facility] in March 2016. He was able to exercise for 8 minutes on the Bruce protocol. Normal HR, BP response. No ST changes. Resting EF was 40-45% but wall motion normalized with exercise to 65% with no wall motion abnormality. Echo in September 2016 showed EF 50-55%.  On follow up today he denies any  SOB. Had one episode of acute left shoulder pain while sleeping but this resolved with change in position. He did have back surgery in December. He has recovered well and is walking 40 min/day. He does have a history of NonHodgkin's lymphoma. He received CHOP chemotherapy in 2000. He had recurrence in 2008 and was treated with a different chemo regimen in 2008-09. There is evidence of recurrence based on  PET scan. This is felt to be low grade and is being followed conservatively for now. He is scheduled for follow up PET/CT in July.    Medication List       This list is accurate as of: 10/16/15 10:28 AM.  Always use your most recent med list.               aspirin EC 81 MG tablet  Take 81 mg by mouth daily.     calcium & magnesium carbonates 311-232 MG tablet  Commonly known as:  MYLANTA  Take 1 tablet by mouth 2 (two) times daily.     COENZYME Q-10 PO  Take 50 mg by mouth 2 (two) times daily.     cyanocobalamin 1000 MCG tablet  Take 100 mcg by mouth daily.     enalapril 10 MG tablet  Commonly known as:  VASOTEC  Take 10 mg by mouth daily.     fish oil-omega-3 fatty acids 1000 MG capsule  Take 1 g by mouth daily.     glucosamine-chondroitin 500-400 MG tablet  Take 1 tablet by mouth 2 (two) times daily.     pravastatin 80 MG tablet  Commonly known as:  PRAVACHOL  Take 80 mg by mouth daily.     ranitidine 150  MG capsule  Commonly known as:  ZANTAC  Take 150 mg by mouth every evening.     saw palmetto 500 MG capsule  Take 500 mg by mouth 2 (two) times daily.     VITAMIN D3 PO  Take 500 Units by mouth 2 (two) times daily.         Allergies  Allergen Reactions  . Gabapentin Nausea Only and Other (See Comments)    dizziness  . Lipitor [Atorvastatin] Other (See Comments)    Myalgias     Past Medical History  Diagnosis Date  . Cancer (HCC)     nhl  . Hypertension   . Hypercholesterolemia   . Arthritis     knee osteoarthritis  . Lymphoma ([place])   . Nonischemic cardiomyopathy ([place]) 03/07/2015    Past Surgical History  Procedure Laterality Date  . Left eye surgery  2013  . Arthroscopies Bilateral   . Thyroidectomy, partial Left   . Vocal cord      polyps removed    Social History   Social History  . Marital Status: Married    Spouse Name: N/A  . Number of Children: 3  .  Years of Education: N/A   Occupational History  . retired-engineer    Social History Main Topics  . Smoking status: Former Smoker    Quit date: 01/20/1974  . Smokeless tobacco: Former Systems developer    Quit date: 06/22/1973  . Alcohol Use: None  . Drug Use: None  . Sexual Activity: Not Asked   Other Topics Concern  . 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 3 children [place] in [place], 2 in [place]    Family History  Problem Relation Age of Onset  . Cancer Brother   . Cancer Brother   . Cancer Brother   . Cancer Brother   . Cancer Brother     Review of Systems: As noted in HPI.  All other systems were reviewed and are negative.  Physical Exam: BP 135/78 mmHg  Pulse 65  Ht 5\' 9"  (1.753 m)  Wt 88.179 kg (194 lb 6.4 oz)  BMI 28.69 kg/m2 Filed Weights   10/16/15 0938  Weight: 88.179 kg (194 lb 6.4 oz)  GENERAL:  Well appearing WM in NAD HEENT:   PERRL, EOMI, sclera are clear. Oropharynx is clear. NECK:  No jugular venous distention, carotid upstroke brisk and symmetric, no bruits, no thyromegaly or adenopathy LUNGS:  Clear to auscultation bilaterally CHEST:  There is chest wall tenderness to palpation.  HEART:  RRR,  PMI not displaced or sustained,S1 and S2 within normal limits, no S3, no S4: no clicks, no rubs, no murmurs ABD:  Soft, nontender. BS +, no masses or bruits. No hepatomegaly, no splenomegaly EXT:  2 + pulses throughout, no edema, no cyanosis no clubbing SKIN:  Warm and dry.  No rashes NEURO:  Alert and oriented x 3. Cranial nerves II through XII intact. PSYCH:  Cognitively intact    LABORATORY DATA:  Lab Results  Component Value Date   WBC 6.4 09/03/2015   HGB 14.2 09/03/2015   HCT 42.8 09/03/2015   PLT 201 09/03/2015   GLUCOSE 93 09/03/2015   CHOL 152 08/30/2014   TRIG 90 08/30/2014   HDL 39* 08/30/2014   LDLCALC 95 08/30/2014   ALT 15 09/03/2015   AST 11 09/03/2015   NA 139 09/03/2015   K 4.4 09/03/2015   CL 103 08/30/2014   CREATININE 1.1 09/03/2015   BUN 16.5 09/03/2015   CO2 27 09/03/2015   TSH 1.706 02/03/2008   Echo: 02/28/15:Study Conclusions  - Left ventricle: The cavity size was mildly dilated. Systolic function was normal. The estimated ejection fraction was in the range of 50% to 55%. Wall motion was normal; there were no regional wall motion abnormalities. - Aortic valve: There was mild regurgitation. - Left atrium: The atrium was mildly dilated. - Atrial septum: No defect or patent foramen ovale was identified.  Ecg today shows NSR with rate 65. Normal Ecg. I have personally reviewed and interpreted this study.   Assessment / Plan: 1.  Nonischemic cardiomyopathy. EF 50-55% by last Echo. LV dysfunction  Possibly related to prior chemo. No overt CHF. Will continue ACEi. If he does require further chemotherapy with cardiotoxic therapy I would recommend close follow up in CHF  clinic with serial Echos.   3. Hyperlipidemia. On pravastatin.   4. NHL- followed by oncology.   5. HTN continue ACEi.

## 2015-10-22 DIAGNOSIS — L821 Other seborrheic keratosis: Secondary | ICD-10-CM | POA: Diagnosis not present

## 2015-10-22 DIAGNOSIS — X32XXXD Exposure to sunlight, subsequent encounter: Secondary | ICD-10-CM | POA: Diagnosis not present

## 2015-10-22 DIAGNOSIS — D0471 Carcinoma in situ of skin of right lower limb, including hip: Secondary | ICD-10-CM | POA: Diagnosis not present

## 2015-10-22 DIAGNOSIS — Z1283 Encounter for screening for malignant neoplasm of skin: Secondary | ICD-10-CM | POA: Diagnosis not present

## 2015-10-22 DIAGNOSIS — L570 Actinic keratosis: Secondary | ICD-10-CM | POA: Diagnosis not present

## 2015-10-22 DIAGNOSIS — C44619 Basal cell carcinoma of skin of left upper limb, including shoulder: Secondary | ICD-10-CM | POA: Diagnosis not present

## 2015-10-29 ENCOUNTER — Other Ambulatory Visit: Payer: Self-pay

## 2015-10-29 ENCOUNTER — Ambulatory Visit (HOSPITAL_BASED_OUTPATIENT_CLINIC_OR_DEPARTMENT_OTHER): Payer: PPO

## 2015-10-29 VITALS — BP 132/71 | HR 64 | Resp 18

## 2015-10-29 DIAGNOSIS — Z452 Encounter for adjustment and management of vascular access device: Secondary | ICD-10-CM | POA: Diagnosis not present

## 2015-10-29 DIAGNOSIS — Z95828 Presence of other vascular implants and grafts: Secondary | ICD-10-CM

## 2015-10-29 DIAGNOSIS — C8290 Follicular lymphoma, unspecified, unspecified site: Secondary | ICD-10-CM

## 2015-10-29 DIAGNOSIS — C8200 Follicular lymphoma grade I, unspecified site: Secondary | ICD-10-CM

## 2015-10-29 HISTORY — DX: Presence of other vascular implants and grafts: Z95.828

## 2015-10-29 MED ORDER — HEPARIN SOD (PORK) LOCK FLUSH 100 UNIT/ML IV SOLN
500.0000 [IU] | Freq: Once | INTRAVENOUS | Status: AC | PRN
  Administered 2015-10-29: 500 [IU] via INTRAVENOUS
  Filled 2015-10-29: qty 5

## 2015-10-29 MED ORDER — SODIUM CHLORIDE 0.9 % IJ SOLN
10.0000 mL | INTRAMUSCULAR | Status: DC | PRN
  Administered 2015-10-29: 10 mL via INTRAVENOUS
  Filled 2015-10-29: qty 10

## 2015-10-29 NOTE — Patient Instructions (Signed)

## 2015-11-26 DIAGNOSIS — Z85820 Personal history of malignant melanoma of skin: Secondary | ICD-10-CM | POA: Diagnosis not present

## 2015-11-26 DIAGNOSIS — Z08 Encounter for follow-up examination after completed treatment for malignant neoplasm: Secondary | ICD-10-CM | POA: Diagnosis not present

## 2015-12-11 DIAGNOSIS — M4326 Fusion of spine, lumbar region: Secondary | ICD-10-CM | POA: Diagnosis not present

## 2015-12-11 DIAGNOSIS — M1611 Unilateral primary osteoarthritis, right hip: Secondary | ICD-10-CM | POA: Diagnosis not present

## 2015-12-23 DIAGNOSIS — M5441 Lumbago with sciatica, right side: Secondary | ICD-10-CM | POA: Diagnosis not present

## 2015-12-23 DIAGNOSIS — M4326 Fusion of spine, lumbar region: Secondary | ICD-10-CM | POA: Diagnosis not present

## 2015-12-23 DIAGNOSIS — M2560 Stiffness of unspecified joint, not elsewhere classified: Secondary | ICD-10-CM | POA: Diagnosis not present

## 2015-12-23 DIAGNOSIS — M6281 Muscle weakness (generalized): Secondary | ICD-10-CM | POA: Diagnosis not present

## 2015-12-30 DIAGNOSIS — M25551 Pain in right hip: Secondary | ICD-10-CM | POA: Diagnosis not present

## 2015-12-31 ENCOUNTER — Ambulatory Visit (HOSPITAL_COMMUNITY)
Admission: RE | Admit: 2015-12-31 | Discharge: 2015-12-31 | Disposition: A | Discharge status: Home / Self Care | Payer: PPO | Source: Ambulatory Visit | Attending: Oncology | Admitting: Oncology

## 2015-12-31 ENCOUNTER — Ambulatory Visit (HOSPITAL_BASED_OUTPATIENT_CLINIC_OR_DEPARTMENT_OTHER): Payer: PPO

## 2015-12-31 ENCOUNTER — Other Ambulatory Visit (HOSPITAL_BASED_OUTPATIENT_CLINIC_OR_DEPARTMENT_OTHER): Payer: PPO

## 2015-12-31 DIAGNOSIS — C8290 Follicular lymphoma, unspecified, unspecified site: Secondary | ICD-10-CM | POA: Diagnosis not present

## 2015-12-31 DIAGNOSIS — Z9889 Other specified postprocedural states: Secondary | ICD-10-CM | POA: Diagnosis not present

## 2015-12-31 DIAGNOSIS — C8200 Follicular lymphoma grade I, unspecified site: Secondary | ICD-10-CM | POA: Diagnosis not present

## 2015-12-31 DIAGNOSIS — Z95828 Presence of other vascular implants and grafts: Secondary | ICD-10-CM

## 2015-12-31 LAB — CBC WITH DIFFERENTIAL/PLATELET
BASO%: 0.2 % (ref 0.0–2.0)
Basophils Absolute: 0 10*3/uL (ref 0.0–0.1)
EOS%: 0.1 % (ref 0.0–7.0)
Eosinophils Absolute: 0 10*3/uL (ref 0.0–0.5)
HCT: 42 % (ref 38.4–49.9)
HGB: 13.9 g/dL (ref 13.0–17.1)
LYMPH%: 12.8 % — ABNORMAL LOW (ref 14.0–49.0)
MCH: 28 pg (ref 27.2–33.4)
MCHC: 33.1 g/dL (ref 32.0–36.0)
MCV: 84.6 fL (ref 79.3–98.0)
MONO#: 1.1 10*3/uL — ABNORMAL HIGH (ref 0.1–0.9)
MONO%: 8.1 % (ref 0.0–14.0)
NEUT#: 10.3 10*3/uL — ABNORMAL HIGH (ref 1.5–6.5)
NEUT%: 78.8 % — ABNORMAL HIGH (ref 39.0–75.0)
Platelets: 180 10*3/uL (ref 140–400)
RBC: 4.96 10*6/uL (ref 4.20–5.82)
RDW: 14.6 % (ref 11.0–14.6)
WBC: 13 10*3/uL — ABNORMAL HIGH (ref 4.0–10.3)
lymph#: 1.7 10*3/uL (ref 0.9–3.3)

## 2015-12-31 LAB — COMPREHENSIVE METABOLIC PANEL
ALT: 10 U/L (ref 0–55)
AST: 8 U/L (ref 5–34)
Albumin: 3.8 g/dL (ref 3.5–5.0)
Alkaline Phosphatase: 49 U/L (ref 40–150)
Anion Gap: 10 mEq/L (ref 3–11)
BUN: 23.6 mg/dL (ref 7.0–26.0)
CO2: 24 mEq/L (ref 22–29)
Calcium: 9.2 mg/dL (ref 8.4–10.4)
Chloride: 106 mEq/L (ref 98–109)
Creatinine: 1 mg/dL (ref 0.7–1.3)
EGFR: 72 mL/min/{1.73_m2} — ABNORMAL LOW (ref >90)
Glucose: 108 mg/dl (ref 70–140)
Potassium: 4.4 mEq/L (ref 3.5–5.1)
Sodium: 140 mEq/L (ref 136–145)
Total Bilirubin: 0.45 mg/dL (ref 0.20–1.20)
Total Protein: 6.6 g/dL (ref 6.4–8.3)

## 2015-12-31 LAB — GLUCOSE, CAPILLARY: Glucose-Capillary: 108 mg/dL — ABNORMAL HIGH (ref 65–99)

## 2015-12-31 MED ORDER — SODIUM CHLORIDE 0.9 % IJ SOLN
10.0000 mL | INTRAMUSCULAR | Status: DC | PRN
  Administered 2015-12-31: 10 mL via INTRAVENOUS
  Filled 2015-12-31: qty 10

## 2015-12-31 MED ORDER — FLUDEOXYGLUCOSE F - 18 (FDG) INJECTION
11.2000 | Freq: Once | INTRAVENOUS | Status: AC | PRN
  Administered 2015-12-31: 11.2 via INTRAVENOUS

## 2015-12-31 NOTE — Patient Instructions (Signed)

## 2016-01-02 ENCOUNTER — Ambulatory Visit (HOSPITAL_BASED_OUTPATIENT_CLINIC_OR_DEPARTMENT_OTHER): Payer: PPO | Admitting: Oncology

## 2016-01-02 ENCOUNTER — Telehealth: Payer: Self-pay | Admitting: Oncology

## 2016-01-02 VITALS — BP 138/64 | HR 62 | Temp 97.9°F | Resp 18 | Ht 69.0 in | Wt 201.1 lb

## 2016-01-02 DIAGNOSIS — C8290 Follicular lymphoma, unspecified, unspecified site: Secondary | ICD-10-CM

## 2016-01-02 DIAGNOSIS — C8200 Follicular lymphoma grade I, unspecified site: Secondary | ICD-10-CM

## 2016-01-02 NOTE — Progress Notes (Signed)
[place] ONCOLOG[facility] OFFICE PROGRESS NOTE 01/02/2016   Lin Landsman Angelique Blonder., MD [address] [place] [address]  Principle diagnosis: 75 year old gentleman Follicular Lymphoma. He was initially diagnosed in June 2000 after presenting with thyroid nodule. He is status post multiple therapies:   PAST THERAPY:  His past 6 cycles of CHOP between July and November 2000.  Weekly Rituxan x 8 weeks. He stayed in remission until August 2008 when PET/CT showed recurrence and biopsy showed a grade I FL.  He received 6 cycles of R-CVP between September 2008 through January 2009.  Then he relocated to [place], [place].He was offered Rituxan monotherapy every 2 months x 2 years by Dr Ralene Ok and got 1 dose but then Dr Juliann Mule stopped after 1 dose and plan was to treat him only once he had a recurrence.  He stayed in remission until October 2014. Recent scan shows slight progression of disease.   CURRENT THERAPY: Observation and surveillance.  INTERVAL HISTORY: Mr. Blakeman presents for follow-up visit with his wife. Since the last visit, he reports no major changes in his health. He continues to have periodic back pain and right groin pain which is unchanged and manageable at this time. He does require epidural injection for his arthritis.He denies any fevers. He denies any weight changes. He denies any abdominal pain. He has not reported any early satiety or bulky adenopathy. He is not any other constitutional symptoms. He continues to have excellent performance status and quality of life.  He does not report any blurry vision or double vision. He does not report chest pain, palpitation, leg edema. He does not report any shortness of breath, cough or hemoptysis or hematemesis. Does not report any nausea, vomiting, abdominal pain, hematochezia, melena. Does not report any neurological deficits. She does not report any lymphadenopathy, petechiae or bleeding. Rest of his review of systems  unremarkable.    ALLERGIES:  is allergic to gabapentin and lipitor.  MEDICATIONS:   Current Outpatient Prescriptions  Medication Sig Dispense Refill  . aspirin EC 81 MG tablet Take 81 mg by mouth daily.    . calcium & magnesium carbonates (MYLANTA) 311-232 MG per tablet Take 1 tablet by mouth 2 (two) times daily.    . Cholecalciferol (VITAMIN D3 PO) Take 500 Units by mouth 2 (two) times daily.    Marland Kitchen COENZYME Q-10 PO Take 50 mg by mouth 2 (two) times daily.     . cyanocobalamin 1000 MCG tablet Take 100 mcg by mouth daily.    . enalapril (VASOTEC) 10 MG tablet Take 10 mg by mouth daily.  0  . fish oil-omega-3 fatty acids 1000 MG capsule Take 1 g by mouth daily.    Marland Kitchen glucosamine-chondroitin 500-400 MG tablet Take 1 tablet by mouth 2 (two) times daily.    . pravastatin (PRAVACHOL) 80 MG tablet Take 80 mg by mouth daily.   1  . ranitidine (ZANTAC) 150 MG capsule Take 150 mg by mouth every evening.    . saw palmetto 500 MG capsule Take 500 mg by mouth 2 (two) times daily.     No current facility-administered medications for this visit.     PHYSICAL EXAMINATION: ECOG PERFORMANCE STATUS: 0  Blood pressure 138/64, pulse 62, temperature 97.9 F (36.6 C), temperature source Oral, resp. rate 18, height 5\' 9"  (1.753 m), weight 201 lb 1.6 oz (91.218 kg), SpO2 100 %. ECOG 1 GENERAL:Well-appearing gentleman without distress. SKIN: No rashes or lesions. EYES: Sclerae are anicteric. OROPHARYNX: No oral  thrush. No ulcers or lesions noted. NECK: supple, thyroid normal size, non-tender. No lymphadenopathy noted. LYMPH:  no palpable lymphadenopathy in the cervical, axillary, inguinal or supraclavicular LUNGS: clear to auscultation. No rhonchi or wheezes. HEART: regular rate & rhythm and no murmurs and no lower extremity edema ABDOMEN:abdomen soft, non-tender and normal bowel sounds no organomegaly noted.  Musculoskeletal:no cyanosis of digits and no clubbing  NEURO: alert & oriented x 3 with  fluent speech, no focal motor/sensory deficits   Labs:  Lab Results  Component Value Date   WBC 13.0* 12/31/2015   HGB 13.9 12/31/2015   HCT 42.0 12/31/2015   MCV 84.6 12/31/2015   PLT 180 12/31/2015   NEUTROABS 10.3* 12/31/2015      Chemistry      Component Value Date/Time   NA 140 12/31/2015 0937   NA 140 08/30/2014 1028   NA 138 01/27/2012 0819   K 4.4 12/31/2015 0937   K 4.6 08/30/2014 1028   K 4.5 01/27/2012 0819   CL 103 08/30/2014 1028   CL 104 09/23/2012 1104   CL 99 01/27/2012 0819   CO2 24 12/31/2015 0937   CO2 31 08/30/2014 1028   CO2 28 01/27/2012 0819   BUN 23.6 12/31/2015 0937   BUN 20 08/30/2014 1028   BUN 19 01/27/2012 0819   CREATININE 1.0 12/31/2015 0937   CREATININE 1.25 08/30/2014 1028   CREATININE 1.06 08/11/2010 1013      Component Value Date/Time   CALCIUM 9.2 12/31/2015 0937   CALCIUM 8.8 08/30/2014 1028   CALCIUM 9.0 01/27/2012 0819   ALKPHOS 49 12/31/2015 0937   ALKPHOS 45 08/30/2014 1028   ALKPHOS 47 01/27/2012 0819   AST 8 12/31/2015 0937   AST 9 08/30/2014 1028   AST 19 01/27/2012 0819   ALT 10 12/31/2015 0937   ALT 12 08/30/2014 1028   ALT 24 01/27/2012 0819   BILITOT 0.45 12/31/2015 0937   BILITOT 0.6 08/30/2014 1028   BILITOT 0.70 01/27/2012 0819      EXAM: NUCLEAR MEDICINE PET SKULL BASE TO THIGH  TECHNIQUE: 11.2 mCi F-18 FDG was injected intravenously. Full-ring PET imaging was performed from the skull base to thigh after the radiotracer. CT data was obtained and used for attenuation correction and anatomic localization.  FASTING BLOOD GLUCOSE: Value: 108 mg/dl  COMPARISON: 04/30/2015 PET-CT.  FINDINGS: NECK  No hypermetabolic lymph nodes in the neck. Previously described subcentimeter hypermetabolic left level 2 cervical node is now non hypermetabolic. Stable atrophy or surgical absence of the left thyroid lobe.  CHEST  No hypermetabolic axillary, mediastinal or hilar nodes. Previously described  hypermetabolic subcarinal lymph node is now non hypermetabolic. Right subclavian MediPort terminates in the lower third of the superior vena cava. Atherosclerotic nonaneurysmal abdominal aorta. Subpleural 3 mm anterior left lower lobe pulmonary nodule associated with the left major fissure (series 8/image 42) is stable since 2013 and considered benign, and is below PET resolution. No acute consolidative airspace disease or new significant pulmonary nodules. No pleural effusions.  ABDOMEN/PELVIS  Mildly hypermetabolic 0.9 cm porta hepatis node with max SUV 4.0 (series 4/image 116), previously 1.0 cm with max SUV 5.1, slightly decreased in size and decreased in metabolism.  Mildly hypermetabolic 1.3 cm portacaval node with max SUV 4.8 (series 4/image 122), previously 1.5 cm with max SUV 5.6, mildly decreased in size and metabolism.  Mildly hypermetabolic 1.6 cm left para-aortic node with max SUV 3.9 (series 4/image 134), previously 1.9 cm with max SUV 5.0, mildly decreased in size  and metabolism.  Hypermetabolic 1.8 cm right external iliac node with max SUV 6.0 (series 4/image 170), previously 2.2 cm with max SUV 7.7, decreased in size and metabolism.  Mildly hypermetabolic 1.3 cm left external iliac node with max SUV 4.4 (series 4/image 178), previously 1.5 cm with max SUV 6.1, mildly decreased in size and metabolism.  Mildly hypermetabolic 1.0 cm right inguinal node with max SUV 3.4 (series 4/image 202), previously 1.2 cm with max SUV 7.1, decreased in size and metabolism.  No abnormal hypermetabolic activity within the liver, pancreas, adrenal glands, or spleen. Heterogeneous mild hepatic steatosis. Simple 2.0 cm renal cyst in the medial upper right kidney. Top-normal size prostate.  SKELETON  No focal hypermetabolic activity to suggest skeletal metastasis. Interval posterior lower lumbar spine fusion surgery with mild hypermetabolism at the sites of fat  stranding in the superficial midline lower back, consistent with expected post surgical inflammatory change. Decreased asymmetric hypermetabolism in the right hip joint (max SUV 4.6, previous max SUV 7.1), probably degenerative.  IMPRESSION: 1. Interval treatment response. No residual hypermetabolic lymphadenopathy in the neck or chest. Mildly hypermetabolic retroperitoneal and bilateral pelvic lymphadenopathy, uniformly decreased in size and metabolism. No new or progressive disease. 2. Expected postsurgical inflammatory uptake in the superficial midline low back soft tissues at the site of recent lumbar spine surgery.    ASSESSMENT AND PLAN:   75 year old gentleman with the following issues:  1. Follicular Lymphoma: His initial diagnosis dates back to your 2000 and had multiple therapies to most recent of which is in 2014. This therapy include CHOP, CVP and single agent rituximab. He had not received bendamustine based regimen.   He is currently on observation and surveillance and continues to be asymptomatic. PET scan on 12/31/2015 was reviewed today and showed regression of his lymphadenopathy. He continues to have very little residual disease but does not require intervention at this time. I recommended continued observation and surveillance and use of his therapy with bendamustine and rituximab upon symptomatic progression.    2. Back pain and hip pain: He is status post spinal stenosis operation as well as epidural injections. Pain is manageable at this time.  3. Port-A-Cath management: He will continue to have this flushed every 2 months.  4. Follow-up: Will be in 6 months.   [facility] MD 01/02/2016

## 2016-01-02 NOTE — Telephone Encounter (Signed)
per pof to sch pt appt-gave pt copy of avs °

## 2016-01-20 DIAGNOSIS — M25551 Pain in right hip: Secondary | ICD-10-CM | POA: Diagnosis not present

## 2016-01-21 DIAGNOSIS — M5441 Lumbago with sciatica, right side: Secondary | ICD-10-CM | POA: Diagnosis not present

## 2016-01-21 DIAGNOSIS — M4326 Fusion of spine, lumbar region: Secondary | ICD-10-CM | POA: Diagnosis not present

## 2016-01-21 DIAGNOSIS — M2560 Stiffness of unspecified joint, not elsewhere classified: Secondary | ICD-10-CM | POA: Diagnosis not present

## 2016-01-21 DIAGNOSIS — M6281 Muscle weakness (generalized): Secondary | ICD-10-CM | POA: Diagnosis not present

## 2016-01-29 DIAGNOSIS — N5201 Erectile dysfunction due to arterial insufficiency: Secondary | ICD-10-CM | POA: Diagnosis not present

## 2016-01-29 DIAGNOSIS — N400 Enlarged prostate without lower urinary tract symptoms: Secondary | ICD-10-CM | POA: Diagnosis not present

## 2016-02-21 DIAGNOSIS — M1611 Unilateral primary osteoarthritis, right hip: Secondary | ICD-10-CM | POA: Diagnosis not present

## 2016-02-21 DIAGNOSIS — M4326 Fusion of spine, lumbar region: Secondary | ICD-10-CM | POA: Diagnosis not present

## 2016-02-21 DIAGNOSIS — I10 Essential (primary) hypertension: Secondary | ICD-10-CM | POA: Diagnosis not present

## 2016-02-21 DIAGNOSIS — Z6829 Body mass index (BMI) 29.0-29.9, adult: Secondary | ICD-10-CM | POA: Diagnosis not present

## 2016-02-27 ENCOUNTER — Ambulatory Visit (HOSPITAL_BASED_OUTPATIENT_CLINIC_OR_DEPARTMENT_OTHER): Payer: PPO

## 2016-02-27 DIAGNOSIS — M25551 Pain in right hip: Secondary | ICD-10-CM | POA: Diagnosis not present

## 2016-02-27 DIAGNOSIS — Z452 Encounter for adjustment and management of vascular access device: Secondary | ICD-10-CM | POA: Diagnosis not present

## 2016-02-27 DIAGNOSIS — M1611 Unilateral primary osteoarthritis, right hip: Secondary | ICD-10-CM | POA: Diagnosis not present

## 2016-02-27 DIAGNOSIS — Z95828 Presence of other vascular implants and grafts: Secondary | ICD-10-CM

## 2016-02-27 DIAGNOSIS — C8290 Follicular lymphoma, unspecified, unspecified site: Secondary | ICD-10-CM

## 2016-02-27 DIAGNOSIS — C8200 Follicular lymphoma grade I, unspecified site: Secondary | ICD-10-CM

## 2016-02-27 DIAGNOSIS — G8929 Other chronic pain: Secondary | ICD-10-CM | POA: Diagnosis not present

## 2016-02-27 MED ORDER — HEPARIN SOD (PORK) LOCK FLUSH 100 UNIT/ML IV SOLN
500.0000 [IU] | Freq: Once | INTRAVENOUS | Status: AC | PRN
  Administered 2016-02-27: 500 [IU] via INTRAVENOUS
  Filled 2016-02-27: qty 5

## 2016-02-27 MED ORDER — SODIUM CHLORIDE 0.9 % IJ SOLN
10.0000 mL | INTRAMUSCULAR | Status: DC | PRN
  Administered 2016-02-27: 10 mL via INTRAVENOUS
  Filled 2016-02-27: qty 10

## 2016-02-27 NOTE — Patient Instructions (Signed)

## 2016-03-02 DIAGNOSIS — Z0000 Encounter for general adult medical examination without abnormal findings: Secondary | ICD-10-CM | POA: Diagnosis not present

## 2016-03-11 DIAGNOSIS — M1611 Unilateral primary osteoarthritis, right hip: Secondary | ICD-10-CM | POA: Diagnosis not present

## 2016-03-12 ENCOUNTER — Telehealth: Payer: Self-pay | Admitting: Cardiology

## 2016-03-12 DIAGNOSIS — M79609 Pain in unspecified limb: Secondary | ICD-10-CM | POA: Diagnosis not present

## 2016-03-12 DIAGNOSIS — R52 Pain, unspecified: Secondary | ICD-10-CM | POA: Diagnosis not present

## 2016-03-12 DIAGNOSIS — R079 Chest pain, unspecified: Secondary | ICD-10-CM | POA: Diagnosis not present

## 2016-03-12 DIAGNOSIS — Z01818 Encounter for other preprocedural examination: Secondary | ICD-10-CM | POA: Diagnosis not present

## 2016-03-12 DIAGNOSIS — Z79899 Other long term (current) drug therapy: Secondary | ICD-10-CM | POA: Diagnosis not present

## 2016-03-12 DIAGNOSIS — E559 Vitamin D deficiency, unspecified: Secondary | ICD-10-CM | POA: Diagnosis not present

## 2016-03-12 NOTE — Telephone Encounter (Signed)
Clearance faxed via Epic to [phone].

## 2016-03-12 NOTE — Telephone Encounter (Signed)
Surgical clearance routed to MD [place].

## 2016-03-12 NOTE — Telephone Encounter (Signed)
Request for surgical clearance:  1. What type of surgery is being performed? Right hip replacement 27130  2. When is this surgery scheduled? 03-23-16  3. Are there any medications that need to be held prior to surgery and how long? no  4. Name of physician performing surgery? Dr.Durrani  5. What is your office phone and fax number? [phone] and fax [phone]

## 2016-03-12 NOTE — Telephone Encounter (Signed)
He is cleared for hip surgery from a cardiac standpoint.  Paras Kreider [place] MD, [facility]

## 2016-03-17 DIAGNOSIS — E785 Hyperlipidemia, unspecified: Secondary | ICD-10-CM | POA: Diagnosis not present

## 2016-03-17 DIAGNOSIS — Z0000 Encounter for general adult medical examination without abnormal findings: Secondary | ICD-10-CM | POA: Diagnosis not present

## 2016-03-17 DIAGNOSIS — I10 Essential (primary) hypertension: Secondary | ICD-10-CM | POA: Diagnosis not present

## 2016-03-23 DIAGNOSIS — Z888 Allergy status to other drugs, medicaments and biological substances status: Secondary | ICD-10-CM | POA: Diagnosis not present

## 2016-03-23 DIAGNOSIS — Z79899 Other long term (current) drug therapy: Secondary | ICD-10-CM | POA: Diagnosis not present

## 2016-03-23 DIAGNOSIS — E7800 Pure hypercholesterolemia, unspecified: Secondary | ICD-10-CM | POA: Diagnosis not present

## 2016-03-23 DIAGNOSIS — M1611 Unilateral primary osteoarthritis, right hip: Secondary | ICD-10-CM | POA: Diagnosis not present

## 2016-03-23 DIAGNOSIS — Z87891 Personal history of nicotine dependence: Secondary | ICD-10-CM | POA: Diagnosis not present

## 2016-03-23 DIAGNOSIS — Z8572 Personal history of non-Hodgkin lymphomas: Secondary | ICD-10-CM | POA: Diagnosis not present

## 2016-03-23 DIAGNOSIS — I10 Essential (primary) hypertension: Secondary | ICD-10-CM | POA: Diagnosis not present

## 2016-03-23 DIAGNOSIS — K2950 Unspecified chronic gastritis without bleeding: Secondary | ICD-10-CM | POA: Diagnosis not present

## 2016-04-06 ENCOUNTER — Telehealth: Payer: Self-pay | Admitting: Oncology

## 2016-04-06 NOTE — Telephone Encounter (Signed)
11/02 and 01/04 appointments canceled per patient request. The patient does not wish to reschedule at the time.

## 2016-04-21 DIAGNOSIS — H26492 Other secondary cataract, left eye: Secondary | ICD-10-CM | POA: Diagnosis not present

## 2016-04-21 DIAGNOSIS — H2511 Age-related nuclear cataract, right eye: Secondary | ICD-10-CM | POA: Diagnosis not present

## 2016-04-21 DIAGNOSIS — H4321 Crystalline deposits in vitreous body, right eye: Secondary | ICD-10-CM | POA: Diagnosis not present

## 2016-04-23 DIAGNOSIS — C8291 Follicular lymphoma, unspecified, lymph nodes of head, face, and neck: Secondary | ICD-10-CM | POA: Diagnosis not present

## 2016-05-05 DIAGNOSIS — M1611 Unilateral primary osteoarthritis, right hip: Secondary | ICD-10-CM | POA: Diagnosis not present

## 2016-05-19 ENCOUNTER — Encounter: Payer: Self-pay | Admitting: Cardiology

## 2016-05-24 NOTE — Progress Notes (Signed)
[name] Ends Date of Birth: 05/11/1941 Medical Record [id number]  History of Present Illness: Sean Hodges is seen for follow up nonischemic cardiomyopathy.  He has a history of low EF following chemotherapy with CHOP in the past.  He had a stress Echo at [facility] in March 2016. He was able to exercise for 8 minutes on the Bruce protocol. Normal HR, BP response. No ST changes. Resting EF was 40-45% but wall motion normalized with exercise to 65% with no wall motion abnormality. Echo in September 2016 showed EF 50-55%.  On follow up today he denies any SOB. No chest pain. He did have hip replacement surgery 2 months ago.  He has recovered well. He does have a history of NonHodgkin's lymphoma. He received CHOP chemotherapy in 2000. He had recurrence in 2008 and was treated with a different chemo regimen in 2008-09. There is evidence of recurrence based on  PET scan. This is felt to be low grade and is being followed conservatively for now. He has not required further chemo.     Medication List       Accurate as of 05/26/16  1:05 PM. Always use your most recent med list.          acetaminophen 500 MG tablet Commonly known as:  TYLENOL Take 500 mg by mouth every 6 (six) hours as needed.   aspirin EC 81 MG tablet Take 81 mg by mouth daily.   calcium & magnesium carbonates 311-232 MG tablet Commonly known as:  MYLANTA Take 1 tablet by mouth 2 (two) times daily.   COENZYME Q-10 PO Take 50 mg by mouth 2 (two) times daily.   cyanocobalamin 1000 MCG tablet Take 100 mcg by mouth daily.   enalapril 10 MG tablet Commonly known as:  VASOTEC Take 10 mg by mouth daily.   fish oil-omega-3 fatty acids 1000 MG capsule Take 1 g by mouth daily.   glucosamine-chondroitin 500-400 MG tablet Take 1 tablet by mouth 2 (two) times daily.   pravastatin 80 MG tablet Commonly known as:  PRAVACHOL Take 80 mg by mouth daily.   ranitidine 150 MG capsule Commonly known as:  ZANTAC Take  150 mg by mouth every evening.   saw palmetto 500 MG capsule Take 500 mg by mouth 2 (two) times daily.   VITAMIN D3 PO Take 500 Units by mouth 2 (two) times daily.        Allergies  Allergen Reactions  . Gabapentin Nausea Only and Other (See Comments)    dizziness  . Lipitor [Atorvastatin] Other (See Comments)    Myalgias     Past Medical History:  Diagnosis Date  . Arthritis    knee osteoarthritis  . Cancer (HCC)    nhl  . Hypercholesterolemia   . Hypertension   . Lymphoma ([place])   . Nonischemic cardiomyopathy ([place]) 03/07/2015    Past Surgical History:  Procedure Laterality Date  . arthroscopies Bilateral   . Left eye surgery  2013  . THYROIDECTOMY, PARTIAL Left   . vocal cord     polyps removed    Social History   Social History  . Marital status: Married    Spouse name: N/A  . Number of children: 3  . Years of education: N/A   Occupational History  . retired-engineer    Social History Main Topics  . Smoking status: Former Smoker    Quit date: 01/20/1974  . Smokeless tobacco: Former Systems developer    Quit date: 06/22/1973  .  Alcohol use None  . Drug use: Unknown  . Sexual activity: Not Asked   Other Topics Concern  . 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 3 [place], 2 in [place]    Family History  Problem Relation Age of Onset  . Cancer Brother   . Cancer Brother   . Cancer Brother   . Cancer Brother   . Cancer Brother     Review of Systems: As noted in HPI.  All other systems were reviewed and are negative.  Physical Exam: BP 127/85 (BP Location: Right Arm, Patient Position: Standing)   Pulse 78   Ht 5\' 9"  (1.753 m)   Wt 198 lb (89.8 kg)   BMI 29.24 kg/m  Filed Weights   05/26/16 1200  Weight: 198 lb (89.8 kg)  GENERAL:  Well appearing WM in NAD HEENT:  PERRL, EOMI, sclera  are clear. Oropharynx is clear. NECK:  No jugular venous distention, carotid upstroke brisk and symmetric, no bruits, no thyromegaly or adenopathy LUNGS:  Clear to auscultation bilaterally CHEST:  There is chest wall tenderness to palpation.  HEART:  RRR,  PMI not displaced or sustained,S1 and S2 within normal limits, no S3, no S4: no clicks, no rubs, no murmurs ABD:  Soft, nontender. BS +, no masses or bruits. No hepatomegaly, no splenomegaly EXT:  2 + pulses throughout, no edema, no cyanosis no clubbing SKIN:  Warm and dry.  No rashes NEURO:  Alert and oriented x 3. Cranial nerves II through XII intact. PSYCH:  Cognitively intact    LABORATORY DATA:  Lab Results  Component Value Date   WBC 13.0 (H) 12/31/2015   HGB 13.9 12/31/2015   HCT 42.0 12/31/2015   PLT 180 12/31/2015   GLUCOSE 108 12/31/2015   CHOL 152 08/30/2014   TRIG 90 08/30/2014   HDL 39 (L) 08/30/2014   LDLCALC 95 08/30/2014   ALT 10 12/31/2015   AST 8 12/31/2015   NA 140 12/31/2015   K 4.4 12/31/2015   CL 103 08/30/2014   CREATININE 1.0 12/31/2015   BUN 23.6 12/31/2015   CO2 24 12/31/2015   TSH 1.706 02/03/2008   Echo: 02/28/15:Study Conclusions  - Left ventricle: The cavity size was mildly dilated. Systolic function was normal. The estimated ejection fraction was in the range of 50% to 55%. Wall motion was normal; there were no regional wall motion abnormalities. - Aortic valve: There was mild regurgitation. - Left atrium: The atrium was mildly dilated. - Atrial septum: No defect or patent foramen ovale was identified.    Assessment / Plan: 1.  Nonischemic cardiomyopathy. EF 50-55% by last Echo. LV dysfunction  Possibly related to prior chemo. No overt CHF. Will continue ACEi. If he does require further chemotherapy with cardiotoxic therapy I would recommend close follow up in CHF clinic with serial Echos.   3. Hyperlipidemia. On pravastatin.   4. NHL- followed by oncology.   5. HTN continue  ACEi.  Follow up in 6 months.

## 2016-05-26 ENCOUNTER — Encounter: Payer: Self-pay | Admitting: Cardiology

## 2016-05-26 ENCOUNTER — Ambulatory Visit (INDEPENDENT_AMBULATORY_CARE_PROVIDER_SITE_OTHER): Payer: PPO | Admitting: Cardiology

## 2016-05-26 VITALS — BP 127/85 | HR 78 | Ht 69.0 in | Wt 198.0 lb

## 2016-05-26 DIAGNOSIS — I10 Essential (primary) hypertension: Secondary | ICD-10-CM

## 2016-05-26 DIAGNOSIS — I428 Other cardiomyopathies: Secondary | ICD-10-CM

## 2016-05-26 DIAGNOSIS — E7800 Pure hypercholesterolemia, unspecified: Secondary | ICD-10-CM

## 2016-05-26 NOTE — Patient Instructions (Addendum)
Continue your current therapy  I will see you in 6 months.   

## 2016-06-16 DIAGNOSIS — M1611 Unilateral primary osteoarthritis, right hip: Secondary | ICD-10-CM | POA: Diagnosis not present

## 2016-06-23 DIAGNOSIS — Z452 Encounter for adjustment and management of vascular access device: Secondary | ICD-10-CM | POA: Diagnosis not present

## 2016-06-23 DIAGNOSIS — C8208 Follicular lymphoma grade I, lymph nodes of multiple sites: Secondary | ICD-10-CM | POA: Diagnosis not present

## 2016-06-25 ENCOUNTER — Other Ambulatory Visit: Payer: PPO

## 2016-06-25 ENCOUNTER — Ambulatory Visit: Payer: PPO | Admitting: Oncology

## 2016-07-03 ENCOUNTER — Ambulatory Visit (INDEPENDENT_AMBULATORY_CARE_PROVIDER_SITE_OTHER): Payer: PPO

## 2016-07-03 ENCOUNTER — Encounter: Payer: Self-pay | Admitting: Sports Medicine

## 2016-07-03 ENCOUNTER — Ambulatory Visit (INDEPENDENT_AMBULATORY_CARE_PROVIDER_SITE_OTHER): Payer: PPO | Admitting: Sports Medicine

## 2016-07-03 DIAGNOSIS — M79671 Pain in right foot: Secondary | ICD-10-CM

## 2016-07-03 DIAGNOSIS — K219 Gastro-esophageal reflux disease without esophagitis: Secondary | ICD-10-CM

## 2016-07-03 DIAGNOSIS — M1990 Unspecified osteoarthritis, unspecified site: Secondary | ICD-10-CM | POA: Insufficient documentation

## 2016-07-03 DIAGNOSIS — C801 Malignant (primary) neoplasm, unspecified: Secondary | ICD-10-CM | POA: Insufficient documentation

## 2016-07-03 DIAGNOSIS — M79672 Pain in left foot: Secondary | ICD-10-CM

## 2016-07-03 DIAGNOSIS — N400 Enlarged prostate without lower urinary tract symptoms: Secondary | ICD-10-CM

## 2016-07-03 DIAGNOSIS — M7752 Other enthesopathy of left foot: Secondary | ICD-10-CM | POA: Diagnosis not present

## 2016-07-03 DIAGNOSIS — M48061 Spinal stenosis, lumbar region without neurogenic claudication: Secondary | ICD-10-CM | POA: Insufficient documentation

## 2016-07-03 DIAGNOSIS — M51369 Other intervertebral disc degeneration, lumbar region without mention of lumbar back pain or lower extremity pain: Secondary | ICD-10-CM

## 2016-07-03 DIAGNOSIS — M2040 Other hammer toe(s) (acquired), unspecified foot: Secondary | ICD-10-CM | POA: Diagnosis not present

## 2016-07-03 DIAGNOSIS — E079 Disorder of thyroid, unspecified: Secondary | ICD-10-CM | POA: Insufficient documentation

## 2016-07-03 DIAGNOSIS — M5136 Other intervertebral disc degeneration, lumbar region: Secondary | ICD-10-CM

## 2016-07-03 DIAGNOSIS — M778 Other enthesopathies, not elsewhere classified: Secondary | ICD-10-CM

## 2016-07-03 DIAGNOSIS — M779 Enthesopathy, unspecified: Principal | ICD-10-CM

## 2016-07-03 DIAGNOSIS — M47816 Spondylosis without myelopathy or radiculopathy, lumbar region: Secondary | ICD-10-CM

## 2016-07-03 HISTORY — DX: Benign prostatic hyperplasia without lower urinary tract symptoms: N40.0

## 2016-07-03 HISTORY — DX: Other intervertebral disc degeneration, lumbar region without mention of lumbar back pain or lower extremity pain: M51.369

## 2016-07-03 HISTORY — DX: Disorder of thyroid, unspecified: E07.9

## 2016-07-03 HISTORY — DX: Spinal stenosis, lumbar region without neurogenic claudication: M48.061

## 2016-07-03 HISTORY — DX: Gastro-esophageal reflux disease without esophagitis: K21.9

## 2016-07-03 HISTORY — DX: Other intervertebral disc degeneration, lumbar region: M51.36

## 2016-07-03 HISTORY — DX: Spondylosis without myelopathy or radiculopathy, lumbar region: M47.816

## 2016-07-03 MED ORDER — TRIAMCINOLONE ACETONIDE 40 MG/ML IJ SUSP: 20.0000 mg | Freq: Once | INTRAMUSCULAR | Status: AC

## 2016-07-03 NOTE — Progress Notes (Signed)
Subjective: Sean Hodges is a 76 y.o. male patient who presents to office for evaluation of left greater than right foot pain. Patient complains of progressive pain especially over the last year at the side of the baby toe and over hammertoes. States that he has tried cushions had taping also has been here before with no relief in symptoms. Patient denies any other pedal complaints. Denies injury/trip/fall/sprain/any causative factors.   Patient Active Problem List   Diagnosis Date Noted  . Arthritis 07/03/2016  . Benign prostatic hyperplasia 07/03/2016  . Cancer ([place]) 07/03/2016  . DDD (degenerative disc disease), lumbar 07/03/2016  . Disease of thyroid gland 07/03/2016  . GERD (gastroesophageal reflux disease) 07/03/2016  . Lumbar stenosis 07/03/2016  . Spondylosis of lumbar joint 07/03/2016  . Port catheter in place 10/29/2015  . Nonischemic cardiomyopathy ([place]) 03/07/2015  . S/P left knee arthroscopy 11/26/2014  . Loose body of left knee 11/20/2014  . Left knee pain 11/15/2014  . Dyspnea on exertion 08/30/2014  . Precordial chest pain 08/30/2014  . Hypertension   . Hyperlipidemia   . Neuropathy due to chemotherapeutic drug ([place]) 09/17/2013  . Follicular lymphoma ([place]) 07/29/2011    Current Outpatient Prescriptions on File Prior to Visit  Medication Sig Dispense Refill  . acetaminophen (TYLENOL) 500 MG tablet Take 500 mg by mouth every 6 (six) hours as needed.    Marland Kitchen aspirin EC 81 MG tablet Take 81 mg by mouth daily.    . calcium & magnesium carbonates (MYLANTA) 311-232 MG per tablet Take 1 tablet by mouth 2 (two) times daily.    . Cholecalciferol (VITAMIN D3 PO) Take 500 Units by mouth 2 (two) times daily.    Marland Kitchen COENZYME Q-10 PO Take 50 mg by mouth 2 (two) times daily.     . enalapril (VASOTEC) 10 MG tablet Take 10 mg by mouth daily.  0  . fish oil-omega-3 fatty acids 1000 MG capsule Take 1 g by mouth daily.    . pravastatin (PRAVACHOL) 80 MG tablet Take 80 mg by mouth daily.    1  . ranitidine (ZANTAC) 150 MG capsule Take 150 mg by mouth every evening.    . saw palmetto 500 MG capsule Take 500 mg by mouth 2 (two) times daily.     No current facility-administered medications on file prior to visit.     Allergies  Allergen Reactions  . Gabapentin Nausea Only and Other (See Comments)    dizziness  . Lipitor [Atorvastatin] Other (See Comments)    Myalgias     Objective:  General: Alert and oriented x3 in no acute distress  Dermatology: No open lesions bilateral lower extremities, no webspace macerations, no ecchymosis bilateral, all nails x 10 are well manicured.  Vascular: Dorsalis Pedis and Posterior Tibial pedal pulses palpable1 out of 4, Capillary Fill Time 3 seconds, scant pedal hair growth bilateral, no edema bilateral lower extremities, Temperature gradient within normal limits.  Neurology: Gross sensation intact via light touch bilateral, vibratory diminished bilateral history of neuropathy with numbness to toes secondary to back fusion. (- )Tinels sign bilateral.   Musculoskeletal: Mild tenderness with palpation at left fifth metatarsal phalangeal joint,No pain with calf compression bilateral. There is decreased ankle rom with knee extending  vs flexed resembling gastroc equnius bilateral, Subtalar joint range of motion is within normal limits, there is no 1st ray hypermobility noted bilateral, decreased 1st MPJ rom bilateral with functional limitus noted on weightbearing exam. Pes cavus foot type with lesser hammertoes. Strength within  normal limits in all groups bilateral.    Xrays  Left and right  Foot   Impression:Normal osseous mineralization. There is significant hammertoe deformity. Pes cavus deformity and inferior spur on right. No other acute findings.  Assessment and Plan: Problem List Items Addressed This Visit    None    Visit Diagnoses    Capsulitis of foot, left    -  Primary   Relevant Medications   triamcinolone acetonide  (KENALOG-40) injection 20 mg   Hammer toe, unspecified laterality       Pain in both feet       Relevant Orders   DG Foot 2 Views Left   DG Foot 2 Views Right       -Complete examination performed -Xrays reviewed -Discussed treatement options capsulitis and hammertoe deformity -After oral consent and aseptic prep, injected a mixture containing 1 ml of 2% plain lidocaine, 1 ml 0.5% plain marcaine, 0.5 ml of kenalog 40 and 0.5 ml of dexamethasone phosphate into left fifth metatarsophalangeal joint without complication. Post-injection care discussed with patient.  -Applied offloading padding to power step inserts -Recommend good supportive shoes -Recommend ice, elevation and rest -Gave toe For left third toe -Advised patient to consider hammertoe correction and ostectomy fifth metatarsal head If continues to be bothersome. -Patient to return to office as needed or sooner if condition worsens.  Landis Martins, DPM

## 2016-07-30 DIAGNOSIS — A0811 Acute gastroenteropathy due to Norwalk agent: Secondary | ICD-10-CM | POA: Diagnosis not present

## 2016-08-06 ENCOUNTER — Ambulatory Visit (INDEPENDENT_AMBULATORY_CARE_PROVIDER_SITE_OTHER): Payer: PPO | Admitting: Sports Medicine

## 2016-08-06 ENCOUNTER — Encounter: Payer: Self-pay | Admitting: Sports Medicine

## 2016-08-06 DIAGNOSIS — M79672 Pain in left foot: Secondary | ICD-10-CM

## 2016-08-06 DIAGNOSIS — M7752 Other enthesopathy of left foot: Secondary | ICD-10-CM | POA: Diagnosis not present

## 2016-08-06 DIAGNOSIS — M2040 Other hammer toe(s) (acquired), unspecified foot: Secondary | ICD-10-CM | POA: Diagnosis not present

## 2016-08-06 DIAGNOSIS — M79671 Pain in right foot: Secondary | ICD-10-CM

## 2016-08-06 DIAGNOSIS — M778 Other enthesopathies, not elsewhere classified: Secondary | ICD-10-CM

## 2016-08-06 DIAGNOSIS — Z01818 Encounter for other preprocedural examination: Secondary | ICD-10-CM

## 2016-08-06 DIAGNOSIS — M779 Enthesopathy, unspecified: Secondary | ICD-10-CM

## 2016-08-06 NOTE — Progress Notes (Signed)
Subjective: Sean Hodges is a 76 y.o. male patient who returns to office for evaluation of left greater than right foot pain. Patient complains of pain at hammertoes and ball of foot; states that the 5th toe feels better after injection however desires to discuss surgery.   Patient Active Problem List   Diagnosis Date Noted  . Arthritis 07/03/2016  . Benign prostatic hyperplasia 07/03/2016  . Cancer ([place]) 07/03/2016  . DDD (degenerative disc disease), lumbar 07/03/2016  . Disease of thyroid gland 07/03/2016  . GERD (gastroesophageal reflux disease) 07/03/2016  . Lumbar stenosis 07/03/2016  . Spondylosis of lumbar joint 07/03/2016  . Port catheter in place 10/29/2015  . Nonischemic cardiomyopathy ([place]) 03/07/2015  . S/P left knee arthroscopy 11/26/2014  . Loose body of left knee 11/20/2014  . Left knee pain 11/15/2014  . Dyspnea on exertion 08/30/2014  . Precordial chest pain 08/30/2014  . Hypertension   . Hyperlipidemia   . Neuropathy due to chemotherapeutic drug ([place]) 09/17/2013  . Follicular lymphoma ([place]) 07/29/2011    Current Outpatient Prescriptions on File Prior to Visit  Medication Sig Dispense Refill  . acetaminophen (TYLENOL) 500 MG tablet Take 500 mg by mouth every 6 (six) hours as needed.    Marland Kitchen aspirin EC 81 MG tablet Take 81 mg by mouth daily.    . calcium & magnesium carbonates (MYLANTA) 311-232 MG per tablet Take 1 tablet by mouth 2 (two) times daily.    . Cholecalciferol (VITAMIN D3 PO) Take 500 Units by mouth 2 (two) times daily.    Marland Kitchen COENZYME Q-10 PO Take 50 mg by mouth 2 (two) times daily.     . DOCOSAHEXAENOIC ACID PO Take by mouth.    . enalapril (VASOTEC) 10 MG tablet Take 10 mg by mouth daily.  0  . fish oil-omega-3 fatty acids 1000 MG capsule Take 1 g by mouth daily.    Marland Kitchen glucosamine-chondroitin 500-400 MG tablet Take by mouth.    Marland Kitchen ibuprofen (ADVIL,MOTRIN) 800 MG tablet     . meloxicam (MOBIC) 15 MG tablet Take 15 mg by mouth.    . oxyCODONE (OXY  IR/ROXICODONE) 5 MG immediate release tablet     . pravastatin (PRAVACHOL) 80 MG tablet Take 80 mg by mouth daily.   1  . ranitidine (ZANTAC) 150 MG capsule Take 150 mg by mouth every evening.    . ranitidine (ZANTAC) 150 MG tablet     . saw palmetto 500 MG capsule Take 500 mg by mouth 2 (two) times daily.    Marland Kitchen sulfamethoxazole-trimethoprim (BACTRIM DS,SEPTRA DS) 800-160 MG tablet     . tadalafil (CIALIS) 5 MG tablet Take 5 mg by mouth.    . vitamin B-12 (CYANOCOBALAMIN) 1000 MCG tablet Take by mouth.     Current Facility-Administered Medications on File Prior to Visit  Medication Dose Route Frequency Provider Last Rate Last Dose  . triamcinolone acetonide (KENALOG-40) injection 20 mg  20 mg Other Once Landis Martins, DPM        Allergies  Allergen Reactions  . Gabapentin Nausea Only and Other (See Comments)    dizziness  . Lipitor [Atorvastatin] Other (See Comments)    Myalgias    Social History   Social History  . Marital status: Married    Spouse name: N/A  . Number of children: 3  . Years of education: N/A   Occupational History  . retired-engineer    Social History Main Topics  . Smoking status: Former Audiological scientist  date: 01/20/1974  . Smokeless tobacco: Former Systems developer    Quit date: 06/22/1973  . Alcohol use None  . Drug use: Unknown  . Sexual activity: Not Asked   Other Topics Concern  . 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 3 [place], 2 in [place]   Family History  Problem Relation Age of Onset  . Cancer Brother   . Cancer Brother   . Cancer Brother   . Cancer Brother   . Cancer Brother    Past Surgical History:  Procedure Laterality Date  . arthroscopies Bilateral   . Left eye surgery  2013  . THYROIDECTOMY, PARTIAL Left   . vocal cord     polyps removed   Objective:  General: Alert  and oriented x3 in no acute distress  Dermatology: No open lesions bilateral lower extremities, no webspace macerations, no ecchymosis bilateral, all nails x 10 are well manicured.  Vascular: Dorsalis Pedis and Posterior Tibial pedal pulses palpable1 out of 4, Capillary Fill Time 3 seconds, scant pedal hair growth bilateral, no edema bilateral lower extremities, Temperature gradient within normal limits.  Neurology: Gross sensation intact via light touch bilateral, vibratory diminished bilateral history of neuropathy with numbness to toes secondary to back fusion. (- )Tinels sign bilateral.   Musculoskeletal: Mild tenderness to lesser hammertoe on left, reduced tenderness with palpation at left fifth metatarsal phalangeal joint,No pain with calf compression bilateral. There is decreased ankle rom with knee extending  vs flexed resembling gastroc equnius bilateral, Subtalar joint range of motion is within normal limits, there is no 1st ray hypermobility noted bilateral, decreased 1st MPJ rom bilateral with functional limitus noted on weightbearing exam. Pes cavus foot type with lesser hammertoes. Strength within normal limits in all groups bilateral.   Assessment and Plan: Problem List Items Addressed This Visit    None    Visit Diagnoses    Hammer toe, unspecified laterality    -  Primary   L>R   Capsulitis of foot, left       Pain in both feet       Pre-op exam           -Complete examination performed -Previous Xrays reviewed -Patient opt for surgical management. Consent obtained for Left 2-5 hammertoe correction with possible k-wires and capsulotomy. Pre and Post op course explained. Risks, benefits, alternatives explained. No guarantees given or implied. Surgical booking slip submitted and provided patient with Surgical packet and info for [facility] -H&P form given  -Dispensed surgical shoe to use post op -Patient to return to office after surgery or sooner if condition  worsens.  Landis Martins, DPM

## 2016-08-06 NOTE — Patient Instructions (Signed)
Pre-Operative Instructions  Congratulations, you have decided to take an important step to improving your quality of life.  You can be assured that the doctors of [facility] will be with you every step of the way.  1. Plan to be at the surgery center/hospital at least 1 (one) hour prior to your scheduled time unless otherwise directed by the surgical center/hospital staff.  You must have a responsible adult accompany you, remain during the surgery and drive you home.  Make sure you have directions to the surgical center/hospital and know how to get there on time. 2. For hospital based surgery you will need to obtain a history and physical form from your family physician within 1 month prior to the date of surgery- we will give you a form for you primary physician.  3. We make every effort to accommodate the date you request for surgery.  There are however, times where surgery dates or times have to be moved.  We will contact you as soon as possible if a change in schedule is required.   4. No Aspirin/Ibuprofen for one week before surgery.  If you are on aspirin, any non-steroidal anti-inflammatory medications (Mobic, Aleve, Ibuprofen) you should stop taking it 7 days prior to your surgery.  You make take Tylenol  For pain prior to surgery.  5. Medications- If you are taking daily heart and blood pressure medications, seizure, reflux, allergy, asthma, anxiety, pain or diabetes medications, make sure the surgery center/hospital is aware before the day of surgery so they may notify you which medications to take or avoid the day of surgery. 6. No food or drink after midnight the night before surgery unless directed otherwise by surgical center/hospital staff. 7. No alcoholic beverages 24 hours prior to surgery.  No smoking 24 hours prior to or 24 hours after surgery. 8. Wear loose pants or shorts- loose enough to fit over bandages, boots, and casts. 9. No slip on shoes, sneakers are best. 10. Bring  your boot with you to the surgery center/hospital.  Also bring crutches or a walker if your physician has prescribed it for you.  If you do not have this equipment, it will be provided for you after surgery. 11. If you have not been contracted by the surgery center/hospital by the day before your surgery, call to confirm the date and time of your surgery. 12. Leave-time from work may vary depending on the type of surgery you have.  Appropriate arrangements should be made prior to surgery with your employer. 13. Prescriptions will be provided immediately following surgery by your doctor.  Have these filled as soon as possible after surgery and take the medication as directed. 14. Remove nail polish on the operative foot. 15. Wash the night before surgery.  The night before surgery wash the foot and leg well with the antibacterial soap provided and water paying special attention to beneath the toenails and in between the toes.  Rinse thoroughly with water and dry well with a towel.  Perform this wash unless told not to do so by your physician.  Enclosed: 1 Ice pack (please put in freezer the night before surgery)   1 Hibiclens skin cleaner   Pre-op Instructions  If you have any questions regarding the instructions, do not hesitate to call our office.  [place]: [address] [place], [place] [address] [phone]  [place]: [address], [place] [address] [phone]  [place]: [address]  [place], [place] [address] [phone]   Dr.   Norman Regal DPM, Dr. Matthew Wagoner DPM, Dr. M. Todd Hyatt DPM, Dr. Akio Hudnall DPM 

## 2016-08-07 ENCOUNTER — Telehealth: Payer: Self-pay | Admitting: *Deleted

## 2016-08-07 NOTE — Telephone Encounter (Signed)
"  I'm a patient of Dr. Cannon Kettle.  She told me to call you to schedule my surgery."   Can you do it on March 19?  "That date will be fine.  Will it be done that morning?"  Yes, it will be sometime that morning.  Someone from the surgical center will call you the Friday before with the arrival time.

## 2016-08-21 DIAGNOSIS — Z452 Encounter for adjustment and management of vascular access device: Secondary | ICD-10-CM | POA: Diagnosis not present

## 2016-08-21 DIAGNOSIS — C8208 Follicular lymphoma grade I, lymph nodes of multiple sites: Secondary | ICD-10-CM | POA: Diagnosis not present

## 2016-08-24 ENCOUNTER — Telehealth: Payer: Self-pay | Admitting: *Deleted

## 2016-08-24 NOTE — Telephone Encounter (Signed)
"  I received your prior authorization.  It appears I received two requests.  So I'm calling to verify what is needed.  Do you need both of them or was it sent in error?  Please call to advise which way to go."  "I'm calling to get the correct count for services on a prior authorization on Sean Hodges."

## 2016-08-25 ENCOUNTER — Telehealth: Payer: Self-pay | Admitting: *Deleted

## 2016-08-25 NOTE — Telephone Encounter (Signed)
[facility] is calling about a prior authorization for pt.

## 2016-08-25 NOTE — Telephone Encounter (Signed)
I am returning Monica's call.  "I can help you.  It looks like this surgery has already been approved previously.  It's the exact same codes.  Do you want to proceed with getting it authorized again?"  Well if it has already been approved you don't have to proceed.  What's the authorization number and how long is it good for?  "The authorization number is [id number] and it was authorized from 08/15/2016 to 11/13/2016.

## 2016-08-26 DIAGNOSIS — K219 Gastro-esophageal reflux disease without esophagitis: Secondary | ICD-10-CM | POA: Diagnosis not present

## 2016-08-26 DIAGNOSIS — I10 Essential (primary) hypertension: Secondary | ICD-10-CM | POA: Diagnosis not present

## 2016-08-26 DIAGNOSIS — E7800 Pure hypercholesterolemia, unspecified: Secondary | ICD-10-CM | POA: Diagnosis not present

## 2016-08-26 NOTE — Telephone Encounter (Signed)
I am returning Sean Hodges's call.  "She was wanting to know the count for the cpt code 28270."  It's four.  "Okay, that is all we needed."

## 2016-08-27 DIAGNOSIS — L821 Other seborrheic keratosis: Secondary | ICD-10-CM | POA: Diagnosis not present

## 2016-08-27 DIAGNOSIS — C44519 Basal cell carcinoma of skin of other part of trunk: Secondary | ICD-10-CM | POA: Diagnosis not present

## 2016-08-27 DIAGNOSIS — C44229 Squamous cell carcinoma of skin of left ear and external auricular canal: Secondary | ICD-10-CM | POA: Diagnosis not present

## 2016-09-07 ENCOUNTER — Encounter: Payer: Self-pay | Admitting: Sports Medicine

## 2016-09-07 DIAGNOSIS — M7752 Other enthesopathy of left foot: Secondary | ICD-10-CM | POA: Diagnosis not present

## 2016-09-07 DIAGNOSIS — M2042 Other hammer toe(s) (acquired), left foot: Secondary | ICD-10-CM | POA: Diagnosis not present

## 2016-09-07 DIAGNOSIS — M779 Enthesopathy, unspecified: Secondary | ICD-10-CM | POA: Diagnosis not present

## 2016-09-08 ENCOUNTER — Encounter: Payer: Self-pay | Admitting: Sports Medicine

## 2016-09-08 NOTE — Postoperative (Signed)
Post op check phone call made to patient. Patient admits to some pain last night. Otherwise denies any other symptoms. Reports that he did not ice like he should of but will do better today. Admits to a little blood on the dressing but otherwise the dressing does not feel wet or saturated. I advise patient to continue with ice and elevation and if there are any issues call office. Continue with PRN meds and follow up as scheduled.  - Dr. Cannon Kettle

## 2016-09-15 DIAGNOSIS — M5410 Radiculopathy, site unspecified: Secondary | ICD-10-CM | POA: Diagnosis not present

## 2016-09-15 DIAGNOSIS — M1611 Unilateral primary osteoarthritis, right hip: Secondary | ICD-10-CM | POA: Diagnosis not present

## 2016-09-16 ENCOUNTER — Ambulatory Visit (INDEPENDENT_AMBULATORY_CARE_PROVIDER_SITE_OTHER): Payer: PPO

## 2016-09-16 ENCOUNTER — Ambulatory Visit (INDEPENDENT_AMBULATORY_CARE_PROVIDER_SITE_OTHER): Payer: Self-pay | Admitting: Sports Medicine

## 2016-09-16 ENCOUNTER — Encounter: Payer: Self-pay | Admitting: Sports Medicine

## 2016-09-16 DIAGNOSIS — M79672 Pain in left foot: Secondary | ICD-10-CM

## 2016-09-16 DIAGNOSIS — Z9889 Other specified postprocedural states: Secondary | ICD-10-CM

## 2016-09-16 MED ORDER — OXYCODONE HCL 5 MG PO TABS: 5.0000 mg | ORAL_TABLET | Freq: Four times a day (QID) | ORAL | 0 refills | Status: DC | PRN

## 2016-09-16 NOTE — Progress Notes (Signed)
Subjective: Sean Hodges is a 76 y.o. male patient seen today in office for POV #1 (DOS 09-07-16, S/P Left hammertoe repair with 2nd capsulotomy and kwire. Patient admits burning pain at surgical site, denies calf pain, denies headache, chest pain, shortness of breath, nausea, vomiting, fever, or chills. Patient states that he is taking motrin during day and pain medication at night. No other issues noted.   Patient Active Problem List   Diagnosis Date Noted  . Arthritis 07/03/2016  . Benign prostatic hyperplasia 07/03/2016  . Cancer ([place]) 07/03/2016  . DDD (degenerative disc disease), lumbar 07/03/2016  . Disease of thyroid gland 07/03/2016  . GERD (gastroesophageal reflux disease) 07/03/2016  . Lumbar stenosis 07/03/2016  . Spondylosis of lumbar joint 07/03/2016  . Port catheter in place 10/29/2015  . Nonischemic cardiomyopathy ([place]) 03/07/2015  . S/P left knee arthroscopy 11/26/2014  . Loose body of left knee 11/20/2014  . Left knee pain 11/15/2014  . Dyspnea on exertion 08/30/2014  . Precordial chest pain 08/30/2014  . Hypertension   . Hyperlipidemia   . Neuropathy due to chemotherapeutic drug ([place]) 09/17/2013  . Follicular lymphoma ([place]) 07/29/2011    Current Outpatient Prescriptions on File Prior to Visit  Medication Sig Dispense Refill  . acetaminophen (TYLENOL) 500 MG tablet Take 500 mg by mouth every 6 (six) hours as needed.    Marland Kitchen aspirin EC 81 MG tablet Take 81 mg by mouth daily.    . calcium & magnesium carbonates (MYLANTA) 311-232 MG per tablet Take 1 tablet by mouth 2 (two) times daily.    . Cholecalciferol (VITAMIN D3 PO) Take 500 Units by mouth 2 (two) times daily.    Marland Kitchen COENZYME Q-10 PO Take 50 mg by mouth 2 (two) times daily.     . DOCOSAHEXAENOIC ACID PO Take by mouth.    . enalapril (VASOTEC) 10 MG tablet Take 10 mg by mouth daily.  0  . fish oil-omega-3 fatty acids 1000 MG capsule Take 1 g by mouth daily.    Marland Kitchen glucosamine-chondroitin 500-400 MG tablet Take by  mouth.    Marland Kitchen ibuprofen (ADVIL,MOTRIN) 800 MG tablet     . meloxicam (MOBIC) 15 MG tablet Take 15 mg by mouth.    . pravastatin (PRAVACHOL) 80 MG tablet Take 80 mg by mouth daily.   1  . ranitidine (ZANTAC) 150 MG capsule Take 150 mg by mouth every evening.    . ranitidine (ZANTAC) 150 MG tablet     . saw palmetto 500 MG capsule Take 500 mg by mouth 2 (two) times daily.    Marland Kitchen sulfamethoxazole-trimethoprim (BACTRIM DS,SEPTRA DS) 800-160 MG tablet     . tadalafil (CIALIS) 5 MG tablet Take 5 mg by mouth.    . vitamin B-12 (CYANOCOBALAMIN) 1000 MCG tablet Take by mouth.     Current Facility-Administered Medications on File Prior to Visit  Medication Dose Route Frequency Provider Last Rate Last Dose  . triamcinolone acetonide (KENALOG-40) injection 20 mg  20 mg Other Once Landis Martins, DPM        Allergies  Allergen Reactions  . Gabapentin Nausea Only and Other (See Comments)    dizziness  . Lipitor [Atorvastatin] Other (See Comments)    Myalgias     Objective: There were no vitals filed for this visit.  General: No acute distress, AAOx3  Left foot: Kwire at 2nd toe and Sutures intact with no gapping or dehiscence at surgical sites, mild swelling left forefoot, no erythema, no warmth, no drainage, no  signs of infection noted, Capillary fill time <3 seconds in all digits, gross sensation present via light touch to left foot. No pain or crepitation with range of motion left foot except at surgical sites.  No pain with calf compression.   Post Op Xray, Left foot: -Kwire at 2nd toe in excellent alignment and position. Arthrodesis site healing. Lesser arthroplasties, Soft tissue swelling within normal limits for post op status.   Assessment and Plan:  Problem List Items Addressed This Visit    None    Visit Diagnoses    S/P foot surgery, left    -  Primary   Relevant Medications   oxyCODONE (OXY IR/ROXICODONE) 5 MG immediate release tablet   Left foot pain       Relevant Medications    oxyCODONE (OXY IR/ROXICODONE) 5 MG immediate release tablet   Other Relevant Orders   DG Foot Complete Left (Completed)       -Patient seen and evaluated -Applied dry sterile dressing to surgical site left foot secured with ACE wrap and stockinet  -Advised patient to make sure to keep dressings clean, dry, and intact to left surgical site, removing the ACE as needed  -Advised patient to continue with post-op shoe on left foot   -Advised patient to limit activity to necessity  -Advised patient to ice and elevate as necessary  -Refilled oxycodone 5mg  for severe pain  -Will plan for suture removal at next office visit. In the meantime, patient to call office if any issues or problems arise.   Landis Martins, DPM

## 2016-09-23 ENCOUNTER — Ambulatory Visit (INDEPENDENT_AMBULATORY_CARE_PROVIDER_SITE_OTHER): Payer: PPO | Admitting: Sports Medicine

## 2016-09-23 ENCOUNTER — Encounter: Payer: Self-pay | Admitting: Sports Medicine

## 2016-09-23 DIAGNOSIS — Z9889 Other specified postprocedural states: Secondary | ICD-10-CM

## 2016-09-23 DIAGNOSIS — M79672 Pain in left foot: Secondary | ICD-10-CM

## 2016-09-23 DIAGNOSIS — M2040 Other hammer toe(s) (acquired), unspecified foot: Secondary | ICD-10-CM

## 2016-09-23 NOTE — Progress Notes (Signed)
Subjective: Sean Hodges is a 75 y.o. male patient seen today in office for POV #2 (DOS 09-07-16, S/P Left hammertoe repair with 2nd capsulotomy and kwire. Patient admits burning pain at surgical site that is getting better, denies calf pain, denies headache, chest pain, shortness of breath, nausea, vomiting, fever, or chills. Patient states that he is taking motrin during day and pain medication at night if he needs it. No other issues noted.   Patient Active Problem List   Diagnosis Date Noted  . Arthritis 07/03/2016  . Benign prostatic hyperplasia 07/03/2016  . Cancer ([place]) 07/03/2016  . DDD (degenerative disc disease), lumbar 07/03/2016  . Disease of thyroid gland 07/03/2016  . GERD (gastroesophageal reflux disease) 07/03/2016  . Lumbar stenosis 07/03/2016  . Spondylosis of lumbar joint 07/03/2016  . Port catheter in place 10/29/2015  . Nonischemic cardiomyopathy ([place]) 03/07/2015  . S/P left knee arthroscopy 11/26/2014  . Loose body of left knee 11/20/2014  . Left knee pain 11/15/2014  . Dyspnea on exertion 08/30/2014  . Precordial chest pain 08/30/2014  . Hypertension   . Hyperlipidemia   . Neuropathy due to chemotherapeutic drug ([place]) 09/17/2013  . Follicular lymphoma ([place]) 07/29/2011    Current Outpatient Prescriptions on File Prior to Visit  Medication Sig Dispense Refill  . acetaminophen (TYLENOL) 500 MG tablet Take 500 mg by mouth every 6 (six) hours as needed.    Marland Kitchen aspirin EC 81 MG tablet Take 81 mg by mouth daily.    . calcium & magnesium carbonates (MYLANTA) 311-232 MG per tablet Take 1 tablet by mouth 2 (two) times daily.    . Cholecalciferol (VITAMIN D3 PO) Take 500 Units by mouth 2 (two) times daily.    Marland Kitchen COENZYME Q-10 PO Take 50 mg by mouth 2 (two) times daily.     . DOCOSAHEXAENOIC ACID PO Take by mouth.    . enalapril (VASOTEC) 10 MG tablet Take 10 mg by mouth daily.  0  . fish oil-omega-3 fatty acids 1000 MG capsule Take 1 g by mouth daily.    Marland Kitchen  glucosamine-chondroitin 500-400 MG tablet Take by mouth.    Marland Kitchen ibuprofen (ADVIL,MOTRIN) 800 MG tablet     . meloxicam (MOBIC) 15 MG tablet Take 15 mg by mouth.    . oxyCODONE (OXY IR/ROXICODONE) 5 MG immediate release tablet Take 1 tablet (5 mg total) by mouth every 6 (six) hours as needed for severe pain. 21 tablet 0  . pravastatin (PRAVACHOL) 80 MG tablet Take 80 mg by mouth daily.   1  . ranitidine (ZANTAC) 150 MG capsule Take 150 mg by mouth every evening.    . ranitidine (ZANTAC) 150 MG tablet     . saw palmetto 500 MG capsule Take 500 mg by mouth 2 (two) times daily.    Marland Kitchen sulfamethoxazole-trimethoprim (BACTRIM DS,SEPTRA DS) 800-160 MG tablet     . tadalafil (CIALIS) 5 MG tablet Take 5 mg by mouth.    . vitamin B-12 (CYANOCOBALAMIN) 1000 MCG tablet Take by mouth.     Current Facility-Administered Medications on File Prior to Visit  Medication Dose Route Frequency Provider Last Rate Last Dose  . triamcinolone acetonide (KENALOG-40) injection 20 mg  20 mg Other Once Landis Martins, DPM        Allergies  Allergen Reactions  . Gabapentin Nausea Only and Other (See Comments)    dizziness  . Lipitor [Atorvastatin] Other (See Comments)    Myalgias     Objective: There were no vitals filed  for this visit.  General: No acute distress, AAOx3  Left foot: Kwire at 2nd toe and Sutures intact with no gapping or dehiscence at surgical sites, mild swelling left forefoot, no erythema, no warmth, no drainage, no signs of infection noted, Capillary fill time <3 seconds in all digits, gross sensation present via light touch to left foot. No pain or crepitation with range of motion left foot except at surgical sites.  No pain with calf compression.   Assessment and Plan:  Problem List Items Addressed This Visit    None    Visit Diagnoses    S/P foot surgery, left    -  Primary   Left foot pain       Hammer toe, unspecified laterality           -Patient seen and evaluated -Sutures removed,  Steri-Strips applied -Applied dry sterile dressing to surgical site left foot secured with ACE wrap and stockinet  -Advised patient to make sure to keep dressings clean, dry, and intact to left surgical site, removing the ACE as needed  -Advised patient to continue with post-op shoe on left foot   -Advised patient to limit activity to necessity  -Advised patient to ice and elevate as necessary  -Continue with oxycodone 5mg  for severe pain as needed -Will plan for x-ray and possible removal of second toe K wire at next office visit. In the meantime, patient to call office if any issues or problems arise.   Landis Martins, DPM

## 2016-10-07 ENCOUNTER — Ambulatory Visit (INDEPENDENT_AMBULATORY_CARE_PROVIDER_SITE_OTHER): Payer: PPO | Admitting: Sports Medicine

## 2016-10-07 ENCOUNTER — Encounter: Payer: Self-pay | Admitting: Sports Medicine

## 2016-10-07 ENCOUNTER — Ambulatory Visit (INDEPENDENT_AMBULATORY_CARE_PROVIDER_SITE_OTHER): Payer: PPO

## 2016-10-07 DIAGNOSIS — Z9889 Other specified postprocedural states: Secondary | ICD-10-CM

## 2016-10-07 DIAGNOSIS — M79672 Pain in left foot: Secondary | ICD-10-CM

## 2016-10-07 DIAGNOSIS — R609 Edema, unspecified: Secondary | ICD-10-CM

## 2016-10-07 NOTE — Progress Notes (Signed)
Subjective: Sean Hodges is a 76 y.o. male patient seen today in office for POV #3 (DOS 09-07-16)  , S/P Left hammertoe repair with 2nd capsulotomy and kwire. Patient admits burning pain at surgical site that is getting better, denies calf pain, denies headache, chest pain, shortness of breath, nausea, vomiting, fever, or chills. Patient states that he is taking motrin during day and pain medication at night if he needs it as noted prior. No other issues noted.   Patient Active Problem List   Diagnosis Date Noted  . Arthritis 07/03/2016  . Benign prostatic hyperplasia 07/03/2016  . Cancer ([place]) 07/03/2016  . DDD (degenerative disc disease), lumbar 07/03/2016  . Disease of thyroid gland 07/03/2016  . GERD (gastroesophageal reflux disease) 07/03/2016  . Lumbar stenosis 07/03/2016  . Spondylosis of lumbar joint 07/03/2016  . Port catheter in place 10/29/2015  . Nonischemic cardiomyopathy ([place]) 03/07/2015  . S/P left knee arthroscopy 11/26/2014  . Loose body of left knee 11/20/2014  . Left knee pain 11/15/2014  . Dyspnea on exertion 08/30/2014  . Precordial chest pain 08/30/2014  . Hypertension   . Hyperlipidemia   . Neuropathy due to chemotherapeutic drug ([place]) 09/17/2013  . Follicular lymphoma ([place]) 07/29/2011    Current Outpatient Prescriptions on File Prior to Visit  Medication Sig Dispense Refill  . acetaminophen (TYLENOL) 500 MG tablet Take 500 mg by mouth every 6 (six) hours as needed.    Marland Kitchen aspirin EC 81 MG tablet Take 81 mg by mouth daily.    . calcium & magnesium carbonates (MYLANTA) 311-232 MG per tablet Take 1 tablet by mouth 2 (two) times daily.    . Cholecalciferol (VITAMIN D3 PO) Take 500 Units by mouth 2 (two) times daily.    Marland Kitchen COENZYME Q-10 PO Take 50 mg by mouth 2 (two) times daily.     . DOCOSAHEXAENOIC ACID PO Take by mouth.    . enalapril (VASOTEC) 10 MG tablet Take 10 mg by mouth daily.  0  . fish oil-omega-3 fatty acids 1000 MG capsule Take 1 g by mouth daily.     Marland Kitchen glucosamine-chondroitin 500-400 MG tablet Take by mouth.    Marland Kitchen ibuprofen (ADVIL,MOTRIN) 800 MG tablet     . meloxicam (MOBIC) 15 MG tablet Take 15 mg by mouth.    . oxyCODONE (OXY IR/ROXICODONE) 5 MG immediate release tablet Take 1 tablet (5 mg total) by mouth every 6 (six) hours as needed for severe pain. 21 tablet 0  . pravastatin (PRAVACHOL) 80 MG tablet Take 80 mg by mouth daily.   1  . ranitidine (ZANTAC) 150 MG capsule Take 150 mg by mouth every evening.    . ranitidine (ZANTAC) 150 MG tablet     . saw palmetto 500 MG capsule Take 500 mg by mouth 2 (two) times daily.    Marland Kitchen sulfamethoxazole-trimethoprim (BACTRIM DS,SEPTRA DS) 800-160 MG tablet     . tadalafil (CIALIS) 5 MG tablet Take 5 mg by mouth.    . vitamin B-12 (CYANOCOBALAMIN) 1000 MCG tablet Take by mouth.     Current Facility-Administered Medications on File Prior to Visit  Medication Dose Route Frequency Provider Last Rate Last Dose  . triamcinolone acetonide (KENALOG-40) injection 20 mg  20 mg Other Once Landis Martins, DPM        Allergies  Allergen Reactions  . Gabapentin Nausea Only and Other (See Comments)    dizziness  . Lipitor [Atorvastatin] Other (See Comments)    Myalgias     Objective:  There were no vitals filed for this visit.  General: No acute distress, AAOx3  Left foot: Kwire at 2nd toe intact, incisions healing well with no gapping or dehiscence at surgical sites, mild swelling left forefoot, no erythema, no warmth, no drainage, no signs of infection noted, Capillary fill time <3 seconds in all digits, gross sensation present via light touch to left foot. No pain or crepitation with range of motion left foot except at surgical sites.  No pain with calf compression.   Xray, left foot kwire intact with evidence of loosening, ready for removal, s/p foot surgery, no other acute findings  Assessment and Plan:  Problem List Items Addressed This Visit    None    Visit Diagnoses    S/P foot surgery,  left    -  Primary   Relevant Orders   DG Foot Complete Left   Left foot pain       Swelling          -Patient seen and evaluated -Xray's reviewed -Kwire removed, Steri-Strips applied -Applied compression anklet and instructed on use -Patient may shower as normal -Patient may wean from post op shoe to tennis shoe as tolerated and as swelling allows -Advised patient to limit activity to necessity  -Advised patient to ice and elevate as necessary  -Continue with oxycodone 5mg  for severe pain as needed -Will plan for increasing activities and discussing shoes and inserts at next office visit. In the meantime, patient to call office if any issues or problems arise.   Landis Martins, DPM

## 2016-10-21 ENCOUNTER — Ambulatory Visit (INDEPENDENT_AMBULATORY_CARE_PROVIDER_SITE_OTHER): Payer: PPO

## 2016-10-21 ENCOUNTER — Ambulatory Visit (INDEPENDENT_AMBULATORY_CARE_PROVIDER_SITE_OTHER): Payer: Self-pay | Admitting: Sports Medicine

## 2016-10-21 ENCOUNTER — Encounter: Payer: Self-pay | Admitting: Sports Medicine

## 2016-10-21 DIAGNOSIS — M79672 Pain in left foot: Secondary | ICD-10-CM

## 2016-10-21 DIAGNOSIS — Z9889 Other specified postprocedural states: Secondary | ICD-10-CM | POA: Diagnosis not present

## 2016-10-21 DIAGNOSIS — C8298 Follicular lymphoma, unspecified, lymph nodes of multiple sites: Secondary | ICD-10-CM

## 2016-10-21 DIAGNOSIS — R609 Edema, unspecified: Secondary | ICD-10-CM

## 2016-10-21 DIAGNOSIS — C8208 Follicular lymphoma grade I, lymph nodes of multiple sites: Secondary | ICD-10-CM | POA: Diagnosis not present

## 2016-10-21 MED ORDER — LIDOCAINE HCL 4 % EX SOLN: CUTANEOUS | 0 refills | Status: DC | PRN

## 2016-10-21 NOTE — Progress Notes (Signed)
Subjective: Sean Hodges is a 76 y.o. male patient seen today in office for POV #4 (DOS 09-07-16)  , S/P Left hammertoe repair with 2nd capsulotomy and kwire. Patient admits burning pain at surgical site, denies calf pain, denies headache, chest pain, shortness of breath, nausea, vomiting, fever, or chills.Patient feel like compression sock is too tight and still has a lot of swelling can not wear normal shoes . No other issues noted.   Patient Active Problem List   Diagnosis Date Noted  . Arthritis 07/03/2016  . Benign prostatic hyperplasia 07/03/2016  . Cancer ([place]) 07/03/2016  . DDD (degenerative disc disease), lumbar 07/03/2016  . Disease of thyroid gland 07/03/2016  . GERD (gastroesophageal reflux disease) 07/03/2016  . Lumbar stenosis 07/03/2016  . Spondylosis of lumbar joint 07/03/2016  . Port catheter in place 10/29/2015  . Nonischemic cardiomyopathy ([place]) 03/07/2015  . S/P left knee arthroscopy 11/26/2014  . Loose body of left knee 11/20/2014  . Left knee pain 11/15/2014  . Dyspnea on exertion 08/30/2014  . Precordial chest pain 08/30/2014  . Hypertension   . Hyperlipidemia   . Neuropathy due to chemotherapeutic drug ([place]) 09/17/2013  . Follicular lymphoma ([place]) 07/29/2011    Current Outpatient Prescriptions on File Prior to Visit  Medication Sig Dispense Refill  . acetaminophen (TYLENOL) 500 MG tablet Take 500 mg by mouth every 6 (six) hours as needed.    Marland Kitchen aspirin EC 81 MG tablet Take 81 mg by mouth daily.    . calcium & magnesium carbonates (MYLANTA) 311-232 MG per tablet Take 1 tablet by mouth 2 (two) times daily.    . Cholecalciferol (VITAMIN D3 PO) Take 500 Units by mouth 2 (two) times daily.    Marland Kitchen COENZYME Q-10 PO Take 50 mg by mouth 2 (two) times daily.     . DOCOSAHEXAENOIC ACID PO Take by mouth.    . enalapril (VASOTEC) 10 MG tablet Take 10 mg by mouth daily.  0  . fish oil-omega-3 fatty acids 1000 MG capsule Take 1 g by mouth daily.    Marland Kitchen  glucosamine-chondroitin 500-400 MG tablet Take by mouth.    Marland Kitchen ibuprofen (ADVIL,MOTRIN) 800 MG tablet     . meloxicam (MOBIC) 15 MG tablet Take 15 mg by mouth.    . oxyCODONE (OXY IR/ROXICODONE) 5 MG immediate release tablet Take 1 tablet (5 mg total) by mouth every 6 (six) hours as needed for severe pain. 21 tablet 0  . pravastatin (PRAVACHOL) 80 MG tablet Take 80 mg by mouth daily.   1  . ranitidine (ZANTAC) 150 MG capsule Take 150 mg by mouth every evening.    . ranitidine (ZANTAC) 150 MG tablet     . saw palmetto 500 MG capsule Take 500 mg by mouth 2 (two) times daily.    Marland Kitchen sulfamethoxazole-trimethoprim (BACTRIM DS,SEPTRA DS) 800-160 MG tablet     . tadalafil (CIALIS) 5 MG tablet Take 5 mg by mouth.    . vitamin B-12 (CYANOCOBALAMIN) 1000 MCG tablet Take by mouth.     Current Facility-Administered Medications on File Prior to Visit  Medication Dose Route Frequency Provider Last Rate Last Dose  . triamcinolone acetonide (KENALOG-40) injection 20 mg  20 mg Other Once Landis Martins, DPM        Allergies  Allergen Reactions  . Gabapentin Nausea Only and Other (See Comments)    dizziness  . Lipitor [Atorvastatin] Other (See Comments)    Myalgias     Objective: There were no vitals filed  for this visit.  General: No acute distress, AAOx3  Left foot: Incisions healing well with no gapping or dehiscence at surgical sites, area of irritation sub met 5 on left foot with no acute infection, mild swelling left forefoot, no erythema, no warmth, no drainage, no signs of infection noted, Capillary fill time <3 seconds in all digits, gross sensation present via light touch to left foot. No pain or crepitation with range of motion left foot except at surgical sites with subjective burning sensation.  No pain with calf compression.   Xray, left foot- s/p foot surgery, no other acute findings  Assessment and Plan:  Problem List Items Addressed This Visit    None    Visit Diagnoses    S/P foot  surgery, left    -  Primary   Relevant Orders   DG Foot Complete Left   Left foot pain       Relevant Medications   lidocaine (XYLOCAINE) 4 % external solution   Swelling          -Patient seen and evaluated -Xray's reviewed -Rx Lidocaine to use to areas of pain  -Applied surgitube compression sleeve and Coban to toes and instructed on use. May return to compression ankle once swelling is decreased  -Patient may wean from post op shoe to tennis shoe as tolerated and as swelling allows -Advised patient to limit activity to tolerance -Advised patient to ice and elevate as necessary  -Continue with oxycodone 5m for severe pain as needed -Will plan for increasing activities and discussing shoes and inserts at next office visit. In the meantime, patient to call office if any issues or problems arise.   TLandis Martins DPM

## 2016-11-04 ENCOUNTER — Ambulatory Visit (INDEPENDENT_AMBULATORY_CARE_PROVIDER_SITE_OTHER): Payer: PPO

## 2016-11-04 ENCOUNTER — Encounter: Payer: Self-pay | Admitting: Sports Medicine

## 2016-11-04 ENCOUNTER — Ambulatory Visit (INDEPENDENT_AMBULATORY_CARE_PROVIDER_SITE_OTHER): Payer: PPO | Admitting: Sports Medicine

## 2016-11-04 DIAGNOSIS — R609 Edema, unspecified: Secondary | ICD-10-CM

## 2016-11-04 DIAGNOSIS — Z9889 Other specified postprocedural states: Secondary | ICD-10-CM

## 2016-11-04 DIAGNOSIS — M79672 Pain in left foot: Secondary | ICD-10-CM

## 2016-11-04 NOTE — Progress Notes (Signed)
Subjective: Sean Hodges is a 76 y.o. male patient seen today in office for POV #5 (DOS 09-07-16) , S/P Left hammertoe repair with 2nd capsulotomy and kwire. Patient admits burning pain at surgical site that's better, denies calf pain, denies headache, chest pain, shortness of breath, nausea, vomiting, fever, or chills.Patient feel like coban wrap is too tight and still has a lot of swelling can not wear normal shoes . No other issues noted.   Patient Active Problem List   Diagnosis Date Noted  . Arthritis 07/03/2016  . Benign prostatic hyperplasia 07/03/2016  . Cancer ([place]) 07/03/2016  . DDD (degenerative disc disease), lumbar 07/03/2016  . Disease of thyroid gland 07/03/2016  . GERD (gastroesophageal reflux disease) 07/03/2016  . Lumbar stenosis 07/03/2016  . Spondylosis of lumbar joint 07/03/2016  . Port catheter in place 10/29/2015  . Nonischemic cardiomyopathy ([place]) 03/07/2015  . S/P left knee arthroscopy 11/26/2014  . Loose body of left knee 11/20/2014  . Left knee pain 11/15/2014  . Dyspnea on exertion 08/30/2014  . Precordial chest pain 08/30/2014  . Hypertension   . Hyperlipidemia   . Neuropathy due to chemotherapeutic drug ([place]) 09/17/2013  . Follicular lymphoma ([place]) 07/29/2011    Current Outpatient Prescriptions on File Prior to Visit  Medication Sig Dispense Refill  . acetaminophen (TYLENOL) 500 MG tablet Take 500 mg by mouth every 6 (six) hours as needed.    Marland Kitchen aspirin EC 81 MG tablet Take 81 mg by mouth daily.    . calcium & magnesium carbonates (MYLANTA) 311-232 MG per tablet Take 1 tablet by mouth 2 (two) times daily.    . Cholecalciferol (VITAMIN D3 PO) Take 500 Units by mouth 2 (two) times daily.    Marland Kitchen COENZYME Q-10 PO Take 50 mg by mouth 2 (two) times daily.     . DOCOSAHEXAENOIC ACID PO Take by mouth.    . enalapril (VASOTEC) 10 MG tablet Take 10 mg by mouth daily.  0  . fish oil-omega-3 fatty acids 1000 MG capsule Take 1 g by mouth daily.    Marland Kitchen  glucosamine-chondroitin 500-400 MG tablet Take by mouth.    Marland Kitchen ibuprofen (ADVIL,MOTRIN) 800 MG tablet     . lidocaine (XYLOCAINE) 4 % external solution Apply topically as needed. 50 mL 0  . meloxicam (MOBIC) 15 MG tablet Take 15 mg by mouth.    . oxyCODONE (OXY IR/ROXICODONE) 5 MG immediate release tablet Take 1 tablet (5 mg total) by mouth every 6 (six) hours as needed for severe pain. 21 tablet 0  . pravastatin (PRAVACHOL) 80 MG tablet Take 80 mg by mouth daily.   1  . ranitidine (ZANTAC) 150 MG capsule Take 150 mg by mouth every evening.    . ranitidine (ZANTAC) 150 MG tablet     . saw palmetto 500 MG capsule Take 500 mg by mouth 2 (two) times daily.    Marland Kitchen sulfamethoxazole-trimethoprim (BACTRIM DS,SEPTRA DS) 800-160 MG tablet     . tadalafil (CIALIS) 5 MG tablet Take 5 mg by mouth.    . vitamin B-12 (CYANOCOBALAMIN) 1000 MCG tablet Take by mouth.     Current Facility-Administered Medications on File Prior to Visit  Medication Dose Route Frequency Provider Last Rate Last Dose  . triamcinolone acetonide (KENALOG-40) injection 20 mg  20 mg Other Once Landis Martins, DPM        Allergies  Allergen Reactions  . Gabapentin Nausea Only and Other (See Comments)    dizziness  . Lipitor [Atorvastatin] Other (  See Comments)    Myalgias     Objective: There were no vitals filed for this visit.  General: No acute distress, AAOx3  Left foot: Incisions well healed at surgical sites, area of irritation sub met 5 on left foot with no acute infection, mild swelling left forefoot, no erythema, no warmth, no drainage, no signs of infection noted, Capillary fill time <3 seconds in all digits, gross sensation present via light touch to left foot. No pain or crepitation with range of motion left foot except at surgical sites with subjective burning sensation that's improved.  No pain with calf compression.   Xray, left foot- s/p foot surgery, no other acute findings  Assessment and Plan:  Problem List  Items Addressed This Visit    None    Visit Diagnoses    S/P foot surgery, left    -  Primary   Relevant Orders   DG Foot Complete Left   Left foot pain       Swelling          -Patient seen and evaluated -Xray's reviewed -Continue with Lidocaine to use to areas of pain  -Continue with surgitube compression sleeve to assist with swelling. May d/c coban to toes -Patient may wean from post op shoe to tennis shoe or open sandal as tolerated and as swelling allows -Advised patient to limit activity to tolerance -Advised patient to ice and elevate as necessary  -Will plan for increasing activities at next office visit. In the meantime, patient to call office if any issues or problems arise. Recommend patient to get new power steps or to consider new set of custom orthotics.   Landis Martins, DPM

## 2016-11-26 NOTE — Progress Notes (Signed)
DOS 09/07/2016 Left hammertoe correction with K wire pins and release of joint and tight tendons

## 2016-12-08 NOTE — Progress Notes (Signed)
[name] Ends Date of Birth: 07-Feb-1941 Medical Record #[id number]  History of Present Illness: Sean Hodges is seen for follow up nonischemic cardiomyopathy.  He has a history of low EF following chemotherapy with CHOP in the past.  He had a stress Echo at [facility] in March 2016. He was able to exercise for 8 minutes on the Bruce protocol. Normal HR, BP response. No ST changes. Resting EF was 40-45% but wall motion normalized with exercise to 65% with no wall motion abnormality. Echo in September 2016 showed EF 50-55%.  He does have a history of NonHodgkin's lymphoma. He received CHOP chemotherapy in 2000. He had recurrence in 2008 and was treated with a different chemo regimen in 2008-09. There is evidence of recurrence based on  PET scan. This is felt to be low grade and is being followed conservatively for now. He has not required further chemo.   He had hip surgery in December and later had toe surgery. He did well with this. He denies any dyspnea. He has had a few episodes of left precordial pain radiating to left shoulder. This is worse with movement and position. Can reproduce it with palpation. Not having pain now.  Allergies as of 12/10/2016      Reactions   Gabapentin Nausea Only, Other (See Comments)   dizziness   Lipitor [atorvastatin] Other (See Comments)   Myalgias      Medication List       Accurate as of 12/10/16 10:19 AM. Always use your most recent med list.          calcium & magnesium carbonates 311-232 MG tablet Commonly known as:  MYLANTA Take 1 tablet by mouth 2 (two) times daily.   COENZYME Q-10 PO Take 50 mg by mouth 2 (two) times daily.   DOCOSAHEXAENOIC ACID PO Take by mouth.   enalapril 10 MG tablet Commonly known as:  VASOTEC Take 10 mg by mouth daily.   fish oil-omega-3 fatty acids 1000 MG capsule Take 1 g by mouth daily.   glucosamine-chondroitin 500-400 MG tablet Take by mouth.   pravastatin 80 MG tablet Commonly known as:   PRAVACHOL Take 80 mg by mouth daily.   ranitidine 150 MG capsule Commonly known as:  ZANTAC Take 150 mg by mouth every evening.   saw palmetto 500 MG capsule Take 500 mg by mouth 2 (two) times daily.   vitamin B-12 1000 MCG tablet Commonly known as:  CYANOCOBALAMIN Take by mouth.   VITAMIN D3 PO Take 500 Units by mouth 2 (two) times daily.        Allergies  Allergen Reactions  . Gabapentin Nausea Only and Other (See Comments)    dizziness  . Lipitor [Atorvastatin] Other (See Comments)    Myalgias     Past Medical History:  Diagnosis Date  . Arthritis    knee osteoarthritis  . Cancer (HCC)    nhl  . Hypercholesterolemia   . Hypertension   . Lymphoma ([place])   . Nonischemic cardiomyopathy ([place]) 03/07/2015    Past Surgical History:  Procedure Laterality Date  . arthroscopies Bilateral   . Left eye surgery  2013  . THYROIDECTOMY, PARTIAL Left   . vocal cord     polyps removed    Social History   Social History  . Marital status: Married    Spouse name: N/A  . Number of children: 3  . Years of education: N/A   Occupational History  . retired-engineer    Social  History Main Topics  . Smoking status: Former Smoker    Quit date: 01/20/1974  . Smokeless tobacco: Former Systems developer    Quit date: 06/22/1973  . Alcohol use None  . Drug use: Unknown  . Sexual activity: Not Asked   Other Topics Concern  . 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 3 [place], 2 in [place]    Family History  Problem Relation Age of Onset  . Cancer Brother   . Cancer Brother   . Cancer Brother   . Cancer Brother   . Cancer Brother     Review of Systems: As noted in HPI.  All other systems were reviewed and are negative.  Physical Exam: BP 138/76   Pulse 60   Ht 5\' 9"  (1.753 m)   Wt 202 lb (91.6 kg)   BMI  29.83 kg/m  Filed Weights   12/10/16 0931  Weight: 202 lb (91.6 kg)  GENERAL:  Well appearing WM in NAD HEENT:  PERRL, EOMI, sclera are clear. Oropharynx is clear. NECK:  No jugular venous distention, carotid upstroke brisk and symmetric, no bruits, no thyromegaly or adenopathy LUNGS:  Clear to auscultation bilaterally CHEST:  There is no chest wall tenderness to palpation.  HEART:  RRR,  PMI not displaced or sustained,S1 and S2 within normal limits, no S3, no S4: no clicks, no rubs, no murmurs ABD:  Soft, nontender. BS +, no masses or bruits. No hepatomegaly, no splenomegaly EXT:  2 + pulses throughout, no edema, no cyanosis no clubbing SKIN:  Warm and dry.  No rashes NEURO:  Alert and oriented x 3. Cranial nerves II through XII intact. PSYCH:  Cognitively intact    LABORATORY DATA:  Lab Results  Component Value Date   WBC 13.0 (H) 12/31/2015   HGB 13.9 12/31/2015   HCT 42.0 12/31/2015   PLT 180 12/31/2015   GLUCOSE 108 12/31/2015   CHOL 152 08/30/2014   TRIG 90 08/30/2014   HDL 39 (L) 08/30/2014   LDLCALC 95 08/30/2014   ALT 10 12/31/2015   AST 8 12/31/2015   NA 140 12/31/2015   K 4.4 12/31/2015   CL 103 08/30/2014   CREATININE 1.0 12/31/2015   BUN 23.6 12/31/2015   CO2 24 12/31/2015   TSH 1.706 02/03/2008   Echo: 02/28/15:Study Conclusions  - Left ventricle: The cavity size was mildly dilated. Systolic function was normal. The estimated ejection fraction was in the range of 50% to 55%. Wall motion was normal; there were no regional wall motion abnormalities. - Aortic valve: There was mild regurgitation. - Left atrium: The atrium was mildly dilated. - Atrial septum: No defect or patent foramen ovale was identified.  Ecg today shows NSR with normal Ecg. I have personally reviewed and interpreted this study.    Assessment / Plan: 1.  Nonischemic cardiomyopathy. EF 50-55% by last Echo. LV dysfunction  Possibly related to prior chemo. No overt CHF. Will  continue ACEi. If he does require further chemotherapy with cardiotoxic therapy I would recommend close follow up in CHF clinic with serial Echos.   3. Hyperlipidemia. On pravastatin. Labs followed by primary care.  4. NHL- followed by oncology. Conservative follow up for now.  5. HTN continue ACEi. Controlled.  6. Atypical chest pain c/w musculoskeletal pain. Take NSAID prn.  Follow up in  6 months.

## 2016-12-10 ENCOUNTER — Encounter: Payer: Self-pay | Admitting: Cardiology

## 2016-12-10 ENCOUNTER — Ambulatory Visit (INDEPENDENT_AMBULATORY_CARE_PROVIDER_SITE_OTHER): Payer: PPO | Admitting: Cardiology

## 2016-12-10 VITALS — BP 138/76 | HR 60 | Ht 69.0 in | Wt 202.0 lb

## 2016-12-10 DIAGNOSIS — E7800 Pure hypercholesterolemia, unspecified: Secondary | ICD-10-CM | POA: Diagnosis not present

## 2016-12-10 DIAGNOSIS — I10 Essential (primary) hypertension: Secondary | ICD-10-CM | POA: Diagnosis not present

## 2016-12-10 DIAGNOSIS — I428 Other cardiomyopathies: Secondary | ICD-10-CM | POA: Diagnosis not present

## 2016-12-10 NOTE — Patient Instructions (Signed)
Continue your current therapy  I will see you in 6 months.   

## 2016-12-16 DIAGNOSIS — M1611 Unilateral primary osteoarthritis, right hip: Secondary | ICD-10-CM | POA: Diagnosis not present

## 2016-12-16 DIAGNOSIS — M5410 Radiculopathy, site unspecified: Secondary | ICD-10-CM | POA: Diagnosis not present

## 2016-12-17 ENCOUNTER — Ambulatory Visit (INDEPENDENT_AMBULATORY_CARE_PROVIDER_SITE_OTHER): Payer: PPO

## 2016-12-17 ENCOUNTER — Ambulatory Visit (INDEPENDENT_AMBULATORY_CARE_PROVIDER_SITE_OTHER): Payer: PPO | Admitting: Sports Medicine

## 2016-12-17 DIAGNOSIS — M2040 Other hammer toe(s) (acquired), unspecified foot: Secondary | ICD-10-CM | POA: Diagnosis not present

## 2016-12-17 DIAGNOSIS — R609 Edema, unspecified: Secondary | ICD-10-CM

## 2016-12-17 DIAGNOSIS — M79672 Pain in left foot: Secondary | ICD-10-CM

## 2016-12-17 DIAGNOSIS — Z9889 Other specified postprocedural states: Secondary | ICD-10-CM

## 2016-12-17 NOTE — Progress Notes (Signed)
Subjective: Sean Hodges is a 76 y.o. male patient seen today in office for POV #6 (DOS 09-07-16) , S/P Left hammertoe repair with 2nd capsulotomy and kwire. Patient admits burning pain at surgical site that's slowly better and swelling that is getting a little better especially after returning to wearing compression anklet, denies calf pain, denies headache, chest pain, shortness of breath, nausea, vomiting, fever, or chills.Patient is in normal shoes . No other issues noted.   Patient Active Problem List   Diagnosis Date Noted  . Arthritis 07/03/2016  . Benign prostatic hyperplasia 07/03/2016  . Cancer ([place]) 07/03/2016  . DDD (degenerative disc disease), lumbar 07/03/2016  . Disease of thyroid gland 07/03/2016  . GERD (gastroesophageal reflux disease) 07/03/2016  . Lumbar stenosis 07/03/2016  . Spondylosis of lumbar joint 07/03/2016  . Port catheter in place 10/29/2015  . Nonischemic cardiomyopathy ([place]) 03/07/2015  . S/P left knee arthroscopy 11/26/2014  . Loose body of left knee 11/20/2014  . Left knee pain 11/15/2014  . Dyspnea on exertion 08/30/2014  . Precordial chest pain 08/30/2014  . Hypertension   . Hyperlipidemia   . Neuropathy due to chemotherapeutic drug ([place]) 09/17/2013  . Follicular lymphoma ([place]) 07/29/2011    Current Outpatient Prescriptions on File Prior to Visit  Medication Sig Dispense Refill  . calcium & magnesium carbonates (MYLANTA) 311-232 MG per tablet Take 1 tablet by mouth 2 (two) times daily.    . Cholecalciferol (VITAMIN D3 PO) Take 500 Units by mouth 2 (two) times daily.    Marland Kitchen COENZYME Q-10 PO Take 50 mg by mouth 2 (two) times daily.     . DOCOSAHEXAENOIC ACID PO Take by mouth.    . enalapril (VASOTEC) 10 MG tablet Take 10 mg by mouth daily.  0  . fish oil-omega-3 fatty acids 1000 MG capsule Take 1 g by mouth daily.    Marland Kitchen glucosamine-chondroitin 500-400 MG tablet Take by mouth.    . pravastatin (PRAVACHOL) 80 MG tablet Take 80 mg by mouth daily.   1   . ranitidine (ZANTAC) 150 MG capsule Take 150 mg by mouth every evening.    . saw palmetto 500 MG capsule Take 500 mg by mouth 2 (two) times daily.    . vitamin B-12 (CYANOCOBALAMIN) 1000 MCG tablet Take by mouth.     Current Facility-Administered Medications on File Prior to Visit  Medication Dose Route Frequency Provider Last Rate Last Dose  . triamcinolone acetonide (KENALOG-40) injection 20 mg  20 mg Other Once Landis Martins, DPM        Allergies  Allergen Reactions  . Gabapentin Nausea Only and Other (See Comments)    dizziness  . Lipitor [Atorvastatin] Other (See Comments)    Myalgias     Objective: There were no vitals filed for this visit.  General: No acute distress, AAOx3  Left foot: Incisions well healed at surgical sites,  + swelling to toes, area of irritation sub met 5 on left foot with no acute infection, mild swelling left forefoot, no erythema, no warmth, no drainage, no signs of infection noted, Capillary fill time <3 seconds in all digits, gross sensation present via light touch to left foot. No pain or crepitation with range of motion left foot except at surgical sites with subjective burning sensation that's improving.  No pain with calf compression.   Xray, left foot- s/p foot surgery, no other acute findings  Assessment and Plan:  Problem List Items Addressed This Visit    None  Visit Diagnoses    Hammer toe, unspecified laterality    -  Primary   Relevant Orders   DG Foot Complete Left   S/P foot surgery, left       Left foot pain       Swelling          -Patient seen and evaluated -Xray's reviewed -Continue with Lidocaine to use to areas of pain  -Continue with compression anklet to assist with edema control -Patient to continue with normal tennis shoes -Continue with power steps that he has -Advised patient to limit activity to tolerance and increasing slowly as instructed -Advised patient to ice and elevate as necessary  -Patient can be  discharged from post op care -Return PRN  Landis Martins, DPM

## 2016-12-21 DIAGNOSIS — C8208 Follicular lymphoma grade I, lymph nodes of multiple sites: Secondary | ICD-10-CM | POA: Diagnosis not present

## 2016-12-21 DIAGNOSIS — Z452 Encounter for adjustment and management of vascular access device: Secondary | ICD-10-CM | POA: Diagnosis not present

## 2017-01-05 DIAGNOSIS — Z08 Encounter for follow-up examination after completed treatment for malignant neoplasm: Secondary | ICD-10-CM | POA: Diagnosis not present

## 2017-01-05 DIAGNOSIS — C44622 Squamous cell carcinoma of skin of right upper limb, including shoulder: Secondary | ICD-10-CM | POA: Diagnosis not present

## 2017-01-05 DIAGNOSIS — Z85828 Personal history of other malignant neoplasm of skin: Secondary | ICD-10-CM | POA: Diagnosis not present

## 2017-02-05 DIAGNOSIS — Z08 Encounter for follow-up examination after completed treatment for malignant neoplasm: Secondary | ICD-10-CM | POA: Diagnosis not present

## 2017-02-05 DIAGNOSIS — Z85828 Personal history of other malignant neoplasm of skin: Secondary | ICD-10-CM | POA: Diagnosis not present

## 2017-02-23 DIAGNOSIS — Z452 Encounter for adjustment and management of vascular access device: Secondary | ICD-10-CM | POA: Diagnosis not present

## 2017-02-23 DIAGNOSIS — C8208 Follicular lymphoma grade I, lymph nodes of multiple sites: Secondary | ICD-10-CM | POA: Diagnosis not present

## 2017-03-03 DIAGNOSIS — Z23 Encounter for immunization: Secondary | ICD-10-CM | POA: Diagnosis not present

## 2017-03-04 DIAGNOSIS — D1801 Hemangioma of skin and subcutaneous tissue: Secondary | ICD-10-CM | POA: Diagnosis not present

## 2017-03-04 DIAGNOSIS — L570 Actinic keratosis: Secondary | ICD-10-CM | POA: Diagnosis not present

## 2017-03-04 DIAGNOSIS — L219 Seborrheic dermatitis, unspecified: Secondary | ICD-10-CM | POA: Diagnosis not present

## 2017-03-18 DIAGNOSIS — M1611 Unilateral primary osteoarthritis, right hip: Secondary | ICD-10-CM | POA: Diagnosis not present

## 2017-03-18 DIAGNOSIS — M5410 Radiculopathy, site unspecified: Secondary | ICD-10-CM | POA: Diagnosis not present

## 2017-03-22 DIAGNOSIS — Z125 Encounter for screening for malignant neoplasm of prostate: Secondary | ICD-10-CM | POA: Diagnosis not present

## 2017-03-22 DIAGNOSIS — E785 Hyperlipidemia, unspecified: Secondary | ICD-10-CM | POA: Diagnosis not present

## 2017-03-22 DIAGNOSIS — Z79899 Other long term (current) drug therapy: Secondary | ICD-10-CM | POA: Diagnosis not present

## 2017-03-25 DIAGNOSIS — N400 Enlarged prostate without lower urinary tract symptoms: Secondary | ICD-10-CM | POA: Diagnosis not present

## 2017-03-25 DIAGNOSIS — N5201 Erectile dysfunction due to arterial insufficiency: Secondary | ICD-10-CM | POA: Diagnosis not present

## 2017-03-29 DIAGNOSIS — A0811 Acute gastroenteropathy due to Norwalk agent: Secondary | ICD-10-CM | POA: Diagnosis not present

## 2017-03-29 DIAGNOSIS — I10 Essential (primary) hypertension: Secondary | ICD-10-CM | POA: Diagnosis not present

## 2017-03-29 DIAGNOSIS — E785 Hyperlipidemia, unspecified: Secondary | ICD-10-CM | POA: Diagnosis not present

## 2017-03-29 DIAGNOSIS — Z0000 Encounter for general adult medical examination without abnormal findings: Secondary | ICD-10-CM | POA: Diagnosis not present

## 2017-04-07 DIAGNOSIS — H2129 Other iris atrophy: Secondary | ICD-10-CM | POA: Diagnosis not present

## 2017-04-07 DIAGNOSIS — H2511 Age-related nuclear cataract, right eye: Secondary | ICD-10-CM | POA: Diagnosis not present

## 2017-04-07 DIAGNOSIS — H4321 Crystalline deposits in vitreous body, right eye: Secondary | ICD-10-CM | POA: Diagnosis not present

## 2017-04-07 DIAGNOSIS — H26492 Other secondary cataract, left eye: Secondary | ICD-10-CM | POA: Diagnosis not present

## 2017-04-08 DIAGNOSIS — R5383 Other fatigue: Secondary | ICD-10-CM | POA: Diagnosis not present

## 2017-04-22 DIAGNOSIS — C8208 Follicular lymphoma grade I, lymph nodes of multiple sites: Secondary | ICD-10-CM | POA: Diagnosis not present

## 2017-04-22 DIAGNOSIS — C8298 Follicular lymphoma, unspecified, lymph nodes of multiple sites: Secondary | ICD-10-CM | POA: Diagnosis not present

## 2017-05-18 DIAGNOSIS — M5410 Radiculopathy, site unspecified: Secondary | ICD-10-CM | POA: Diagnosis not present

## 2017-05-18 DIAGNOSIS — Z96641 Presence of right artificial hip joint: Secondary | ICD-10-CM | POA: Diagnosis not present

## 2017-05-18 DIAGNOSIS — M1611 Unilateral primary osteoarthritis, right hip: Secondary | ICD-10-CM | POA: Diagnosis not present

## 2017-05-20 DIAGNOSIS — M5416 Radiculopathy, lumbar region: Secondary | ICD-10-CM | POA: Diagnosis not present

## 2017-05-20 DIAGNOSIS — M545 Low back pain: Secondary | ICD-10-CM | POA: Diagnosis not present

## 2017-05-28 DIAGNOSIS — M1611 Unilateral primary osteoarthritis, right hip: Secondary | ICD-10-CM | POA: Diagnosis not present

## 2017-05-28 DIAGNOSIS — M5410 Radiculopathy, site unspecified: Secondary | ICD-10-CM | POA: Diagnosis not present

## 2017-06-10 ENCOUNTER — Encounter: Payer: Self-pay | Admitting: Physician Assistant

## 2017-06-10 ENCOUNTER — Ambulatory Visit: Payer: PPO | Admitting: Physician Assistant

## 2017-06-10 VITALS — BP 141/80 | HR 65 | Ht 69.0 in | Wt 208.6 lb

## 2017-06-10 DIAGNOSIS — C8590 Non-Hodgkin lymphoma, unspecified, unspecified site: Secondary | ICD-10-CM

## 2017-06-10 DIAGNOSIS — I10 Essential (primary) hypertension: Secondary | ICD-10-CM | POA: Diagnosis not present

## 2017-06-10 DIAGNOSIS — I428 Other cardiomyopathies: Secondary | ICD-10-CM | POA: Diagnosis not present

## 2017-06-10 DIAGNOSIS — E785 Hyperlipidemia, unspecified: Secondary | ICD-10-CM | POA: Diagnosis not present

## 2017-06-10 NOTE — Progress Notes (Signed)
Cardiology Office Note    Date:  06/10/2017   ID:  Sean Hodges, DOB January 30, 1941, MRN [id number]  PCP:  [name], MD  Cardiologist:  Dr. Martinique   Chief Complaint  Patient presents with  . Follow-up    6 month visit. seen for Dr. Martinique.     History of Present Illness:  Sean Hodges is a 76 y.o. male with PMH of NICM, HTN, HLD and non-Hodgkin's lymphoma (received CHOP chemo in 2000).  He had a history of low EF following chemotherapy with CHOP in the past.  Stress echo at [facility] in March 2016 showed resting EF 40-45%, normal wall motion, EF improved to 65% with exercise.  Echo in September 2016 showed EF 50-55%.  He had a recurrence of non-Hodgkin's lymphoma in 2008 was treated with a different chemo therapy between 2008 and 2009.  According to Dr. Doug Sou last note, if he does have further recurrence requiring chemotherapy with cardiotoxic agent, it will be recommended for him to follow-up in the CHF clinic with serial echo.  Patient presents today for cardiology office visit along with his wife.  He has not had any recurrent issue with non-Hodgkin's lymphoma, his physician still monitoring it at this stage.  He denies any chest pain or shortness of breath.  There is no lower extremity edema on physical exam.  He has no new heart failure symptoms.  He does have back pain and resultant lower extremity tingling.  He denies any ambulatory lower extremity pain.  He has great dorsalis pedis pulse and posterior tibial pulse, suspicion for lower extremity peripheral vascular disease very low.  He is currently not on aspirin given the need for Celebrex.   Past Medical History:  Diagnosis Date  . Arthritis    knee osteoarthritis  . Cancer (HCC)    nhl  . Hypercholesterolemia   . Hypertension   . Lymphoma ([place])   . Nonischemic cardiomyopathy ([place]) 03/07/2015    Past Surgical History:  Procedure Laterality Date  . arthroscopies Bilateral   . Left eye surgery   2013  . THYROIDECTOMY, PARTIAL Left   . vocal cord     polyps removed    Current Medications: Outpatient Medications Prior to Visit  Medication Sig Dispense Refill  . calcium & magnesium carbonates (MYLANTA) 311-232 MG per tablet Take 1 tablet by mouth 2 (two) times daily.    . celecoxib (CELEBREX) 100 MG capsule Take 100 mg by mouth 2 (two) times daily.    . Cholecalciferol (VITAMIN D3 PO) Take 500 Units by mouth 2 (two) times daily.    Marland Kitchen COENZYME Q-10 PO Take 50 mg by mouth 2 (two) times daily.     . DOCOSAHEXAENOIC ACID PO Take by mouth.    . enalapril (VASOTEC) 10 MG tablet Take 10 mg by mouth daily.  0  . fish oil-omega-3 fatty acids 1000 MG capsule Take 1 g by mouth daily.    Marland Kitchen glucosamine-chondroitin 500-400 MG tablet Take by mouth.    . pravastatin (PRAVACHOL) 80 MG tablet Take 80 mg by mouth daily.   1  . ranitidine (ZANTAC) 150 MG capsule Take 150 mg by mouth every evening.    . saw palmetto 500 MG capsule Take 500 mg by mouth 2 (two) times daily.    . vitamin B-12 (CYANOCOBALAMIN) 1000 MCG tablet Take by mouth.     Facility-Administered Medications Prior to Visit  Medication Dose Route Frequency Provider Last Rate Last Dose  .  triamcinolone acetonide (KENALOG-40) injection 20 mg  20 mg Other Once Landis Martins, DPM         Allergies:   Gabapentin and Lipitor [atorvastatin]   Social History   Socioeconomic History  . Marital status: Married    Spouse name: None  . Number of children: 3  . Years of education: None  . Highest education level: None  Social Needs  . Financial resource strain: None  . Food insecurity - worry: None  . Food insecurity - inability: None  . Transportation needs - medical: None  . Transportation needs - non-medical: None  Occupational History  . Occupation: retired-engineer  Tobacco Use  . Smoking status: Former Smoker    Last attempt to quit: 01/20/1974    Years since quitting: 43.4  . Smokeless tobacco: Former Systems developer    Quit date:  06/22/1973  Substance and Sexual Activity  . Alcohol use: None  . Drug use: None  . Sexual activity: None  Other Topics Concern  . 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 3 [place], 2 in [place]     Family History:  The patient's family history includes Cancer in his brother, brother, brother, brother, and brother.   ROS:   Please see the history of present illness.    ROS All other systems reviewed and are negative.   PHYSICAL EXAM:   VS:  BP (!) 141/80   Pulse 65   Ht 5\' 9"  (1.753 m)   Wt 208 lb 9.6 oz (94.6 kg)   BMI 30.80 kg/m    GEN: Well nourished, well developed, in no acute distress  HEENT: normal  Neck: no JVD, carotid bruits, or masses Cardiac: RRR; no murmurs, rubs, or gallops,no edema  Respiratory:  clear to auscultation bilaterally, normal work of breathing GI: soft, nontender, nondistended, + BS MS: no deformity or atrophy  Skin: warm and dry, no rash Neuro:  Alert and Oriented x 3, Strength and sensation are intact Psych: euthymic mood, full affect  Wt Readings from Last 3 Encounters:  06/10/17 208 lb 9.6 oz (94.6 kg)  12/10/16 202 lb (91.6 kg)  05/26/16 198 lb (89.8 kg)      Studies/Labs Reviewed:   EKG:  EKG is not ordered today.   Recent Labs: No results found for requested labs within last 8760 hours.   Lipid Panel    Component Value Date/Time   CHOL 152 08/30/2014 1028   TRIG 90 08/30/2014 1028   HDL 39 (L) 08/30/2014 1028   CHOLHDL 3.9 08/30/2014 1028   VLDL 18 08/30/2014 1028   [place] 95 08/30/2014 1028    Additional studies/ records that were reviewed today include:   Echo 02/28/2015 LV EF: 50% -   55%  Study Conclusions  - Left ventricle: The cavity size was mildly dilated. Systolic   function was normal. The estimated ejection fraction was in the   range of 50%  to 55%. Wall motion was normal; there were no   regional wall motion abnormalities. - Aortic valve: There was mild regurgitation. - Left atrium: The atrium was mildly dilated. - Atrial septum: No defect or patent foramen ovale was identified.   ASSESSMENT:    1. NICM (nonischemic cardiomyopathy) ([place])   2. Non-Hodgkin's lymphoma, unspecified body region, unspecified non-Hodgkin lymphoma type ([place])   3. Essential  hypertension   4. Hyperlipidemia, unspecified hyperlipidemia type      PLAN:  In order of problems listed above:  1. NICM: No obvious heart failure symptoms.  Last echocardiogram showed EF 50-55%  2. Non-Hodgkin's lymphoma: Continue to monitor, he will need to let us know if he restarted on chemotherapy  3. Hypertension: Systolic blood pressure running in the 130s at home on current therapy.  4. Hyperlipidemia: Request lab work from PCPs office.  Continue on Pravachol.    Medication Adjustments/Labs and Tests Ordered: Current medicines are reviewed at length with the patient today.  Concerns regarding medicines are outlined above.  Medication changes, Labs and Tests ordered today are listed in the Patient Instructions below. Patient Instructions  Your physician wants you to follow-up in: 6 MONTHS WITH DR Martinique You will receive a reminder letter in the mail two months in advance. If you don't receive a letter, please call our office to schedule the follow-up appointment.   If you need a refill on your cardiac medications before your next appointment, please call your pharmacy.     Hilbert Corrigan, [place]  06/10/2017 10:12 AM    [address], [place], [place]  [address] Phone: [phone]; Fax: [phone]

## 2017-06-10 NOTE — Patient Instructions (Signed)
Your physician wants you to follow-up in: 6 MONTHS WITH DR JORDAN You will receive a reminder letter in the mail two months in advance. If you don't receive a letter, please call our office to schedule the follow-up appointment.   If you need a refill on your cardiac medications before your next appointment, please call your pharmacy.  

## 2017-06-24 DIAGNOSIS — M545 Low back pain: Secondary | ICD-10-CM | POA: Diagnosis not present

## 2017-06-24 DIAGNOSIS — M5410 Radiculopathy, site unspecified: Secondary | ICD-10-CM | POA: Diagnosis not present

## 2017-06-24 DIAGNOSIS — M47817 Spondylosis without myelopathy or radiculopathy, lumbosacral region: Secondary | ICD-10-CM | POA: Diagnosis not present

## 2017-07-15 DIAGNOSIS — M1611 Unilateral primary osteoarthritis, right hip: Secondary | ICD-10-CM | POA: Diagnosis not present

## 2017-07-20 DIAGNOSIS — M47817 Spondylosis without myelopathy or radiculopathy, lumbosacral region: Secondary | ICD-10-CM | POA: Diagnosis not present

## 2017-07-20 DIAGNOSIS — M5410 Radiculopathy, site unspecified: Secondary | ICD-10-CM | POA: Diagnosis not present

## 2017-08-11 DIAGNOSIS — Z96641 Presence of right artificial hip joint: Secondary | ICD-10-CM | POA: Diagnosis not present

## 2017-08-11 DIAGNOSIS — M545 Low back pain: Secondary | ICD-10-CM | POA: Diagnosis not present

## 2017-08-11 DIAGNOSIS — M1611 Unilateral primary osteoarthritis, right hip: Secondary | ICD-10-CM | POA: Diagnosis not present

## 2017-08-23 DIAGNOSIS — M79609 Pain in unspecified limb: Secondary | ICD-10-CM | POA: Diagnosis not present

## 2017-08-23 DIAGNOSIS — R52 Pain, unspecified: Secondary | ICD-10-CM | POA: Diagnosis not present

## 2017-08-23 DIAGNOSIS — J984 Other disorders of lung: Secondary | ICD-10-CM | POA: Diagnosis not present

## 2017-08-23 DIAGNOSIS — E559 Vitamin D deficiency, unspecified: Secondary | ICD-10-CM | POA: Diagnosis not present

## 2017-08-23 DIAGNOSIS — Z79899 Other long term (current) drug therapy: Secondary | ICD-10-CM | POA: Diagnosis not present

## 2017-08-23 DIAGNOSIS — Z01818 Encounter for other preprocedural examination: Secondary | ICD-10-CM | POA: Diagnosis not present

## 2017-08-23 LAB — PROTIME-INR

## 2017-08-24 ENCOUNTER — Telehealth: Payer: Self-pay

## 2017-08-24 NOTE — Telephone Encounter (Signed)
   [facility] Pre-operative Risk Assessment    Request for surgical clearance:  1. What type of surgery is being performed? Laminectomy and discectomy L 3-4   2. When is this surgery scheduled? TBA  3. What type of clearance is required (medical clearance vs. Pharmacy clearance to hold med vs. Both)? Medical  4. Are there any medications that need to be held prior to surgery and how long? no  5. Practice name and name of physician performing surgery? [facility]  6. What is your office phone and fax number? Phone # [phone]   Fax # [phone]   7. Anesthesia type (None, local, MAC, general) ? unknown   Kathyrn Lass 08/24/2017, 9:52 AM  _________________________________________________________________   (provider comments below)  \

## 2017-08-25 DIAGNOSIS — M48062 Spinal stenosis, lumbar region with neurogenic claudication: Secondary | ICD-10-CM | POA: Diagnosis not present

## 2017-08-25 DIAGNOSIS — M5136 Other intervertebral disc degeneration, lumbar region: Secondary | ICD-10-CM | POA: Diagnosis not present

## 2017-08-25 DIAGNOSIS — M4716 Other spondylosis with myelopathy, lumbar region: Secondary | ICD-10-CM | POA: Diagnosis not present

## 2017-08-25 DIAGNOSIS — M4326 Fusion of spine, lumbar region: Secondary | ICD-10-CM | POA: Diagnosis not present

## 2017-08-25 NOTE — Telephone Encounter (Signed)
   Primary Cardiologist: Peter Martinique, MD  Chart reviewed as part of pre-operative protocol coverage. Patient was contacted 08/25/2017 in reference to pre-operative risk assessment for pending surgery as outlined below.  Sean Hodges was last seen on 06/10/17 by Almyra Deforest PA-C. H/o NICM, HTN, HLD and non-Hodgkin's lymphoma (received CHOP chemo in 2000). Last EF 50-55% in 2016. RCRI 0.9%. Since that day, Sean Hodges has done well. [place] the mall regularly, no anginal symptoms. Able to perform over 5 METS without any cardiac symptoms (activity otherwise limited by back issue but he affirms he's maintained good physical activity).  Therefore, based on ACC/AHA guidelines, the patient would be at acceptable risk for the planned procedure without further cardiovascular testing.   He tells me that he actually elected to switch surgeons and does not need me to fax to the surgeon listed. He states we will likely get a request from another office at which time this clearance can be used - the patient knows to call us in the meantime between now and surgery date if any new or concerning symptoms.   Charlie Pitter, PA-C 08/25/2017, 5:09 PM

## 2017-08-26 DIAGNOSIS — M48061 Spinal stenosis, lumbar region without neurogenic claudication: Secondary | ICD-10-CM | POA: Diagnosis not present

## 2017-08-26 DIAGNOSIS — Z6829 Body mass index (BMI) 29.0-29.9, adult: Secondary | ICD-10-CM | POA: Diagnosis not present

## 2017-08-26 DIAGNOSIS — I428 Other cardiomyopathies: Secondary | ICD-10-CM | POA: Diagnosis not present

## 2017-08-26 NOTE — Telephone Encounter (Signed)
See previous note by Melina Copa, patient is well known to me since his last office visit on 06/10/2017. He has been doing well from cardiac perspective. He was cleared by Melina Copa on 08/25/2017, I also agree with the decision. I will forward this clearance to his surgeon and remove this from preop pool.   Hilbert Corrigan PA Pager: [phone]

## 2017-08-26 NOTE — Telephone Encounter (Signed)
Follow Up:    Pt says he have changed surgeon.Please send his clearance to Dr Rennis Harding fax is [phone].

## 2017-09-06 NOTE — Telephone Encounter (Signed)
Received duplicate clearance request - surgery is scheduled on 09/21/2017 Will fax this encounter to [phone]

## 2017-09-14 DIAGNOSIS — M5106 Intervertebral disc disorders with myelopathy, lumbar region: Secondary | ICD-10-CM | POA: Diagnosis not present

## 2017-09-14 DIAGNOSIS — M4716 Other spondylosis with myelopathy, lumbar region: Secondary | ICD-10-CM | POA: Diagnosis not present

## 2017-09-14 DIAGNOSIS — M545 Low back pain: Secondary | ICD-10-CM | POA: Diagnosis not present

## 2017-09-21 DIAGNOSIS — M48062 Spinal stenosis, lumbar region with neurogenic claudication: Secondary | ICD-10-CM | POA: Diagnosis not present

## 2017-09-21 DIAGNOSIS — Z87891 Personal history of nicotine dependence: Secondary | ICD-10-CM | POA: Diagnosis not present

## 2017-09-21 DIAGNOSIS — I429 Cardiomyopathy, unspecified: Secondary | ICD-10-CM | POA: Diagnosis not present

## 2017-09-21 DIAGNOSIS — M4716 Other spondylosis with myelopathy, lumbar region: Secondary | ICD-10-CM | POA: Diagnosis not present

## 2017-09-21 DIAGNOSIS — M4326 Fusion of spine, lumbar region: Secondary | ICD-10-CM | POA: Diagnosis not present

## 2017-09-21 DIAGNOSIS — M961 Postlaminectomy syndrome, not elsewhere classified: Secondary | ICD-10-CM | POA: Diagnosis not present

## 2017-09-21 DIAGNOSIS — Z981 Arthrodesis status: Secondary | ICD-10-CM | POA: Diagnosis not present

## 2017-09-21 DIAGNOSIS — Z888 Allergy status to other drugs, medicaments and biological substances status: Secondary | ICD-10-CM | POA: Diagnosis not present

## 2017-09-21 DIAGNOSIS — K219 Gastro-esophageal reflux disease without esophagitis: Secondary | ICD-10-CM | POA: Diagnosis not present

## 2017-09-21 HISTORY — DX: Spinal stenosis, lumbar region with neurogenic claudication: M48.062

## 2017-09-27 ENCOUNTER — Other Ambulatory Visit: Payer: Self-pay

## 2017-09-27 DIAGNOSIS — Z79899 Other long term (current) drug therapy: Secondary | ICD-10-CM | POA: Diagnosis not present

## 2017-09-27 DIAGNOSIS — K5900 Constipation, unspecified: Secondary | ICD-10-CM | POA: Diagnosis not present

## 2017-09-27 DIAGNOSIS — N139 Obstructive and reflux uropathy, unspecified: Secondary | ICD-10-CM | POA: Diagnosis not present

## 2017-09-27 DIAGNOSIS — R109 Unspecified abdominal pain: Secondary | ICD-10-CM | POA: Diagnosis not present

## 2017-09-27 DIAGNOSIS — N390 Urinary tract infection, site not specified: Secondary | ICD-10-CM | POA: Diagnosis not present

## 2017-09-27 NOTE — Patient Outreach (Signed)
[place] [facility]) Care Management  09/27/2017  NASON CONRADT Feb 16, 1941 [id number]     Transition of Care Referral  Referral Date: 09/27/17 Referral Source: [facility] Discharge Report Date of Admission: unknown Diagnosis: unknown Date of Discharge: 09/23/17 Facility: [place]: [facility]TA    Referral received. No outreach warranted at this time. TOC will be completed by primary care provider office who will refer to [facility] care mgmt if needed.   Plan: RN CM will close case at this time.    Enzo Montgomery, RN,BSN,CCM [place] Management Telephonic Care Management Coordinator Direct Phone: [phone] Toll Free: [phone] Fax: [phone]

## 2017-09-29 ENCOUNTER — Other Ambulatory Visit: Payer: Self-pay

## 2017-09-29 NOTE — Patient Outreach (Signed)
[place] [facility]) Care Management  09/29/2017  Sean Hodges 07-24-1940 [id number]   Telephone Screen  Referral Date: 09/29/17 Referral Source: Nurse Call Center Referral Reason: " caller had back surgery a week ago and Sunday he could not have a BM or urinate, the ER said it was the pain meds causing it and he has some questions about that, he has a cath now and has some questions about that" Insurance: HTA  Outreach attempt # 1 to patient. Spoke with patient. He states that he is feeling better than yesterday. He shares that he had been having some issues with constipation and not urinating. He has a foley now and goes to urology appt next week to possibly have it removed. RN CM provided education to patient on catheter care and s/s of infection. He voiced understanding. He reports that med given to him in ER worked and he no longer has constipation. He had been having some diarrhea for a few days but voices that stool is turning back solid and normal. He has some hemorrhoids but is using Preparation H to affected area. He voices that he has supportive spouse that is helping him out. He denies any further needs or concerns at this time. He is aware that he can call Nurse Call Line 24/7 for any future needs or concerns. Patient voiced understanding and was appreciative of follow up call.       Plan: RN CM will close case at this time as no further interventions needed.    Enzo Montgomery, RN,BSN,CCM [place] Management Telephonic Care Management Coordinator Direct Phone: [phone] Toll Free: [phone] Fax: [phone]

## 2017-10-05 DIAGNOSIS — N138 Other obstructive and reflux uropathy: Secondary | ICD-10-CM | POA: Diagnosis not present

## 2017-10-05 DIAGNOSIS — N9989 Other postprocedural complications and disorders of genitourinary system: Secondary | ICD-10-CM

## 2017-10-05 DIAGNOSIS — R338 Other retention of urine: Secondary | ICD-10-CM

## 2017-10-05 DIAGNOSIS — Z9889 Other specified postprocedural states: Secondary | ICD-10-CM | POA: Diagnosis not present

## 2017-10-05 DIAGNOSIS — N401 Enlarged prostate with lower urinary tract symptoms: Secondary | ICD-10-CM | POA: Diagnosis not present

## 2017-10-05 HISTORY — DX: Other retention of urine: N99.89

## 2017-10-07 DIAGNOSIS — K5900 Constipation, unspecified: Secondary | ICD-10-CM | POA: Diagnosis not present

## 2017-10-07 DIAGNOSIS — K625 Hemorrhage of anus and rectum: Secondary | ICD-10-CM | POA: Diagnosis not present

## 2017-10-07 DIAGNOSIS — L820 Inflamed seborrheic keratosis: Secondary | ICD-10-CM | POA: Diagnosis not present

## 2017-10-19 DIAGNOSIS — M545 Low back pain: Secondary | ICD-10-CM | POA: Diagnosis not present

## 2017-10-21 DIAGNOSIS — C8208 Follicular lymphoma grade I, lymph nodes of multiple sites: Secondary | ICD-10-CM | POA: Diagnosis not present

## 2017-10-21 DIAGNOSIS — C8298 Follicular lymphoma, unspecified, lymph nodes of multiple sites: Secondary | ICD-10-CM | POA: Diagnosis not present

## 2017-10-22 DIAGNOSIS — N138 Other obstructive and reflux uropathy: Secondary | ICD-10-CM | POA: Diagnosis not present

## 2017-10-22 DIAGNOSIS — N401 Enlarged prostate with lower urinary tract symptoms: Secondary | ICD-10-CM | POA: Diagnosis not present

## 2017-12-06 ENCOUNTER — Ambulatory Visit: Payer: PPO | Admitting: Cardiology

## 2017-12-15 DIAGNOSIS — M4326 Fusion of spine, lumbar region: Secondary | ICD-10-CM | POA: Diagnosis not present

## 2018-01-06 DIAGNOSIS — L570 Actinic keratosis: Secondary | ICD-10-CM | POA: Diagnosis not present

## 2018-01-06 DIAGNOSIS — D485 Neoplasm of uncertain behavior of skin: Secondary | ICD-10-CM | POA: Diagnosis not present

## 2018-01-06 DIAGNOSIS — L821 Other seborrheic keratosis: Secondary | ICD-10-CM | POA: Diagnosis not present

## 2018-01-06 DIAGNOSIS — L853 Xerosis cutis: Secondary | ICD-10-CM | POA: Diagnosis not present

## 2018-01-12 DIAGNOSIS — L986 Other infiltrative disorders of the skin and subcutaneous tissue: Secondary | ICD-10-CM | POA: Diagnosis not present

## 2018-01-17 DIAGNOSIS — J40 Bronchitis, not specified as acute or chronic: Secondary | ICD-10-CM | POA: Diagnosis not present

## 2018-01-17 DIAGNOSIS — Z6829 Body mass index (BMI) 29.0-29.9, adult: Secondary | ICD-10-CM | POA: Diagnosis not present

## 2018-01-17 DIAGNOSIS — J329 Chronic sinusitis, unspecified: Secondary | ICD-10-CM | POA: Diagnosis not present

## 2018-01-19 DIAGNOSIS — J9809 Other diseases of bronchus, not elsewhere classified: Secondary | ICD-10-CM | POA: Diagnosis not present

## 2018-01-19 DIAGNOSIS — R51 Headache: Secondary | ICD-10-CM | POA: Diagnosis not present

## 2018-01-19 DIAGNOSIS — J209 Acute bronchitis, unspecified: Secondary | ICD-10-CM | POA: Diagnosis not present

## 2018-01-19 DIAGNOSIS — E7800 Pure hypercholesterolemia, unspecified: Secondary | ICD-10-CM | POA: Diagnosis not present

## 2018-01-19 DIAGNOSIS — R0602 Shortness of breath: Secondary | ICD-10-CM | POA: Diagnosis not present

## 2018-01-19 DIAGNOSIS — Z87891 Personal history of nicotine dependence: Secondary | ICD-10-CM | POA: Diagnosis not present

## 2018-01-19 DIAGNOSIS — I10 Essential (primary) hypertension: Secondary | ICD-10-CM | POA: Diagnosis not present

## 2018-01-25 DIAGNOSIS — Z1339 Encounter for screening examination for other mental health and behavioral disorders: Secondary | ICD-10-CM | POA: Diagnosis not present

## 2018-01-25 DIAGNOSIS — Z6830 Body mass index (BMI) 30.0-30.9, adult: Secondary | ICD-10-CM | POA: Diagnosis not present

## 2018-01-25 DIAGNOSIS — J40 Bronchitis, not specified as acute or chronic: Secondary | ICD-10-CM | POA: Diagnosis not present

## 2018-01-28 DIAGNOSIS — C8338 Diffuse large B-cell lymphoma, lymph nodes of multiple sites: Secondary | ICD-10-CM | POA: Diagnosis not present

## 2018-01-28 DIAGNOSIS — C8208 Follicular lymphoma grade I, lymph nodes of multiple sites: Secondary | ICD-10-CM | POA: Diagnosis not present

## 2018-01-28 DIAGNOSIS — C8298 Follicular lymphoma, unspecified, lymph nodes of multiple sites: Secondary | ICD-10-CM | POA: Diagnosis not present

## 2018-02-08 DIAGNOSIS — H81399 Other peripheral vertigo, unspecified ear: Secondary | ICD-10-CM | POA: Diagnosis not present

## 2018-02-09 ENCOUNTER — Other Ambulatory Visit: Payer: Self-pay

## 2018-02-09 DIAGNOSIS — C8208 Follicular lymphoma grade I, lymph nodes of multiple sites: Secondary | ICD-10-CM | POA: Diagnosis not present

## 2018-02-09 DIAGNOSIS — Z8572 Personal history of non-Hodgkin lymphomas: Secondary | ICD-10-CM | POA: Diagnosis not present

## 2018-02-09 NOTE — Patient Outreach (Signed)
[facility]) Care Management  02/09/2018  AERIC BURNHAM [birth year] [id number]   Telephone Screen  Referral Date: 02/09/18 Referral Source: Nurse Call Center Referral Reason: "caller states he is having nausea, dizziness, vomiting that started this morning,very dizzy to the point he almost lost consciousness, this has happened before and diagnosed with inner ear infection, has nerve issue in his ears" Insurance: HTA   Outreach attempt # 1 to patient. No answer at present. RN CMM left HIPAA compliant voicemail message along with contact info.       Plan: RN CM will make outreach attempt to patient within 3-4 business days.  RN CM will send unsuccessful outreach letter to patient.    Enzo Montgomery, RN,BSN,CCM [place] Management Telephonic Care Management Coordinator Direct Phone: [phone] Toll Free: [phone] Fax: [phone]

## 2018-02-10 ENCOUNTER — Other Ambulatory Visit: Payer: Self-pay

## 2018-02-10 DIAGNOSIS — R591 Generalized enlarged lymph nodes: Secondary | ICD-10-CM | POA: Diagnosis not present

## 2018-02-10 DIAGNOSIS — C8208 Follicular lymphoma grade I, lymph nodes of multiple sites: Secondary | ICD-10-CM | POA: Diagnosis not present

## 2018-02-10 DIAGNOSIS — C8338 Diffuse large B-cell lymphoma, lymph nodes of multiple sites: Secondary | ICD-10-CM | POA: Diagnosis not present

## 2018-02-10 DIAGNOSIS — C8298 Follicular lymphoma, unspecified, lymph nodes of multiple sites: Secondary | ICD-10-CM | POA: Diagnosis not present

## 2018-02-10 NOTE — Patient Outreach (Signed)
[place] [facility]) Care Management  02/10/2018  Sean Hodges April 11, 1941 [id number]    Telephone Screen  Referral Date: 02/09/18 Referral Source: Nurse Call Center Referral Reason: "caller states he is having nausea, dizziness, vomiting that started this morning,very dizzy to the point he almost lost consciousness, this has happened before and diagnosed with inner ear infection, has nerve issue in his ears" Insurance: HTA   Outreach attempt #2 to patient. Spoke with patient. He voices that he is "feeling a little better." He took nurse line advice and sought medical attention. Patient shares that he went to urgent care. He voices "it was what I thought it was" referencing his ongoing issues of inner problems. He voices that he has been having this problem off and on for about 10 years now. He was given med to take and has been taking as ordered. He is ware to follow up with PCP for any worsening and/or unresolved symptoms. He denies any further RN CM needs or concerns at this time. Patient was appreciative of follow up call. Patient advised to call Nurse Line 24/7 for any future needs or concerns. He voiced understanding.     Plan: RN CM will close case at this time.    Enzo Montgomery, RN,BSN,CCM [place] Management Telephonic Care Management Coordinator Direct Phone: [phone] Toll Free: [phone] Fax: [phone]

## 2018-02-24 ENCOUNTER — Encounter: Payer: Self-pay | Admitting: Cardiology

## 2018-02-25 DIAGNOSIS — R42 Dizziness and giddiness: Secondary | ICD-10-CM | POA: Diagnosis not present

## 2018-02-25 DIAGNOSIS — R490 Dysphonia: Secondary | ICD-10-CM | POA: Diagnosis not present

## 2018-02-25 DIAGNOSIS — J3489 Other specified disorders of nose and nasal sinuses: Secondary | ICD-10-CM | POA: Diagnosis not present

## 2018-02-25 DIAGNOSIS — C8510 Unspecified B-cell lymphoma, unspecified site: Secondary | ICD-10-CM | POA: Diagnosis not present

## 2018-02-25 DIAGNOSIS — Z9889 Other specified postprocedural states: Secondary | ICD-10-CM | POA: Diagnosis not present

## 2018-02-25 DIAGNOSIS — R1310 Dysphagia, unspecified: Secondary | ICD-10-CM | POA: Diagnosis not present

## 2018-02-25 DIAGNOSIS — Z9221 Personal history of antineoplastic chemotherapy: Secondary | ICD-10-CM | POA: Diagnosis not present

## 2018-02-25 DIAGNOSIS — J342 Deviated nasal septum: Secondary | ICD-10-CM | POA: Diagnosis not present

## 2018-02-25 DIAGNOSIS — H9319 Tinnitus, unspecified ear: Secondary | ICD-10-CM | POA: Diagnosis not present

## 2018-02-25 DIAGNOSIS — K219 Gastro-esophageal reflux disease without esophagitis: Secondary | ICD-10-CM | POA: Diagnosis not present

## 2018-02-25 DIAGNOSIS — J383 Other diseases of vocal cords: Secondary | ICD-10-CM | POA: Diagnosis not present

## 2018-03-03 DIAGNOSIS — Z23 Encounter for immunization: Secondary | ICD-10-CM | POA: Diagnosis not present

## 2018-03-10 NOTE — Progress Notes (Signed)
Cardiology Office Note    Date:  03/11/2018   ID:  Sean Hodges, DOB 20-May-1941, MRN [id number]  PCP:  [name], MD  Cardiologist:  Dr. Martinique   Chief Complaint  Patient presents with  . Hypertension  . Congestive Heart Failure    History of Present Illness:  Sean Hodges is a 77 y.o. male with PMH of NICM, HTN, HLD and non-Hodgkin's lymphoma (received CHOP chemo in 2000).  He had a history of low EF following chemotherapy with CHOP in the past.  Stress echo at [facility] in March 2016 showed resting EF 40-45%, normal wall motion, EF improved to 65% with exercise.  Echo in September 2016 showed EF 50-55%.  He had a recurrence of non-Hodgkin's lymphoma in 2008 was treated with a different chemo therapy between 2008 and 2009.    He underwent lumbar laminectomy in April 2019.  He reports 2 episodes of bronchitis in June.  On follow up today he is seen  with his wife.  He reports an enlarged node in the right neck that is growing and felt to be related to his lymphoma. Continuing to monitor at this time and no chemo recommended yet.   He denies any chest pain or shortness of breath. No edema.   He has been less active since his back surgery. He did have a CT for his lymphoma that did show aortic and coronary calcifications. He states this has been noted on prior CTs.    Past Medical History:  Diagnosis Date  . Arthritis    knee osteoarthritis  . Cancer (HCC)    nhl  . Hypercholesterolemia   . Hypertension   . Lymphoma ([place])   . Nonischemic cardiomyopathy ([place]) 03/07/2015    Past Surgical History:  Procedure Laterality Date  . arthroscopies Bilateral   . Left eye surgery  2013  . THYROIDECTOMY, PARTIAL Left   . vocal cord     polyps removed    Current Medications: Outpatient Medications Prior to Visit  Medication Sig Dispense Refill  . calcium & magnesium carbonates (MYLANTA) 311-232 MG per tablet Take 1 tablet by mouth 2 (two) times daily.    .  Cholecalciferol (VITAMIN D3 PO) Take 500 Units by mouth 2 (two) times daily.    Marland Kitchen COENZYME Q-10 PO Take 50 mg by mouth 2 (two) times daily.     . DOCOSAHEXAENOIC ACID PO Take 1 g by mouth daily.     . enalapril (VASOTEC) 10 MG tablet Take 10 mg by mouth daily.  0  . fish oil-omega-3 fatty acids 1000 MG capsule Take 1 g by mouth daily.    Marland Kitchen glucosamine-chondroitin 500-400 MG tablet Take 1 tablet by mouth 2 (two) times daily.     Marland Kitchen omeprazole (PRILOSEC) 40 MG capsule Take 40 mg by mouth daily.  11  . pravastatin (PRAVACHOL) 80 MG tablet Take 80 mg by mouth daily.   1  . ranitidine (ZANTAC) 150 MG capsule Take 150 mg by mouth every evening.    . saw palmetto 500 MG capsule Take 500 mg by mouth 2 (two) times daily.    . vitamin B-12 (CYANOCOBALAMIN) 1000 MCG tablet Take 1,000 mcg by mouth daily.     . celecoxib (CELEBREX) 100 MG capsule Take 100 mg by mouth 2 (two) times daily.     Facility-Administered Medications Prior to Visit  Medication Dose Route Frequency Provider Last Rate Last Dose  . triamcinolone acetonide (KENALOG-40) injection 20 mg  20 mg Other Once Landis Martins, DPM         Allergies:   Gabapentin and Lipitor [atorvastatin]   Social History   Socioeconomic History  . Marital status: Married    Spouse name: Not on file  . Number of children: 3  . Years of education: Not on file  . Highest education level: Not on file  Occupational History  . Occupation: retired-engineer  Social Needs  . Financial resource strain: Not on file  . Food insecurity:    Worry: Not on file    Inability: Not on file  . Transportation needs:    Medical: Not on file    Non-medical: Not on file  Tobacco Use  . Smoking status: Former Smoker    Last attempt to quit: 01/20/1974    Years since quitting: 44.1  . Smokeless tobacco: Former Systems developer    Quit date: 06/22/1973  Substance and Sexual Activity  . Alcohol use: Never    Frequency: Never  . Drug use: Never  . Sexual activity: Not on file    Lifestyle  . Physical activity:    Days per week: Not on file    Minutes per session: Not on file  . Stress: Not on file  Relationships  . Social connections:    Talks on phone: Not on file    Gets together: Not on file    Attends religious service: Not on file    Active member of club or organization: Not on file    Attends meetings of clubs or organizations: Not on file    Relationship status: Not on file  Other Topics Concern  . 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 3 [place], 2 in [place]     Family History:  The patient's family history includes Cancer in his brother, brother, brother, brother, and brother.   ROS:   Please see the history of present illness.    ROS All other systems reviewed and are negative.   PHYSICAL EXAM:   VS:  BP (!) 142/76   Pulse 78   Ht 5\' 9"  (1.753 m)   Wt 204 lb 12.8 oz (92.9 kg)   BMI 30.24 kg/m    GENERAL:  Well appearing overweight WM in NAD HEENT:  PERRL, EOMI, sclera are clear. Oropharynx is clear. NECK:  No jugular venous distention, carotid upstroke brisk and symmetric, no bruits, no thyromegaly or adenopathy LUNGS:  Clear to auscultation bilaterally CHEST:  Unremarkable HEART:  RRR,  PMI not displaced or sustained,S1 and S2 within normal limits, no S3, no S4: no clicks, no rubs, no murmurs ABD:  Soft, nontender. BS +, no masses or bruits. No hepatomegaly, no splenomegaly EXT:  2 + pulses throughout, no edema, no cyanosis no clubbing. Skin is dry and scaly. SKIN:  Warm and dry.  No rashes NEURO:  Alert and oriented x 3. Cranial nerves II through XII intact. PSYCH:  Cognitively intact    Wt Readings from Last 3 Encounters:  03/11/18 204 lb 12.8 oz (92.9 kg)  06/10/17 208 lb 9.6 oz (94.6 kg)  12/10/16 202 lb (91.6 kg)      Studies/Labs Reviewed:    EKG:  EKG is not ordered today.   Recent Labs: No results found for requested labs within last 8760 hours.  Lipid Panel    Component Value Date/Time   CHOL 152 08/30/2014 1028   TRIG 90 08/30/2014 1028   HDL 39 (L) 08/30/2014 1028   CHOLHDL 3.9 08/30/2014 1028   VLDL 18 08/30/2014 1028   LDLCALC 95 08/30/2014 1028   Labs dated 03/22/17: cholesterol 171, triglycerides 148, HDL 35, LDL 106. Creatinine 1.09. Otherwise chemistries and CBC normal.   Additional studies/ records that were reviewed today include:   Echo 02/28/2015 LV EF: 50% -   55%  Study Conclusions  - Left ventricle: The cavity size was mildly dilated. Systolic   function was normal. The estimated ejection fraction was in the   range of 50% to 55%. Wall motion was normal; there were no   regional wall motion abnormalities. - Aortic valve: There was mild regurgitation. - Left atrium: The atrium was mildly dilated. - Atrial septum: No defect or patent foramen ovale was identified.   ASSESSMENT:    1. NICM (nonischemic cardiomyopathy) ([place])   2. Non-Hodgkin's lymphoma, unspecified body region, unspecified non-Hodgkin lymphoma type ([place])   3. Essential hypertension      PLAN:  In order of problems listed above:  1. NICM: asymptomatic.  Last echocardiogram showed EF 50-55% in 2016.   2. Non-Hodgkin's lymphoma- some new disease: Continue to monitor- followed by Dr Bobby Rumpf in [place], he will need to let us know if he restarted on chemotherapy especially if there is cardiotoxic potential  3. Hypertension: controlled on current therapy  4. Hyperlipidemia: Request lab work from PCPs office.  Continue on Pravachol.  5.    Coronary calcification without symptoms of angina. Continue risk factor modification.     Medication Adjustments/Labs and Tests Ordered: Current medicines are reviewed at length with the patient today.  Concerns regarding medicines are outlined above.  Medication changes, Labs and Tests  ordered today are listed in the Patient Instructions below. Patient Instructions  Continue your current therapy  I will see you in one year     Signed, Zaccai Chavarin Martinique, MD  03/11/2018 9:42 AM    [facility] [address], [place], [place]  [address] Phone: [phone]; Fax: [phone]

## 2018-03-11 ENCOUNTER — Encounter: Payer: Self-pay | Admitting: Cardiology

## 2018-03-11 ENCOUNTER — Ambulatory Visit: Payer: PPO | Admitting: Cardiology

## 2018-03-11 VITALS — BP 142/76 | HR 78 | Ht 69.0 in | Wt 204.8 lb

## 2018-03-11 DIAGNOSIS — I428 Other cardiomyopathies: Secondary | ICD-10-CM

## 2018-03-11 DIAGNOSIS — C8590 Non-Hodgkin lymphoma, unspecified, unspecified site: Secondary | ICD-10-CM | POA: Diagnosis not present

## 2018-03-11 DIAGNOSIS — I10 Essential (primary) hypertension: Secondary | ICD-10-CM

## 2018-03-11 NOTE — Patient Instructions (Addendum)
Continue your current therapy  I will see you in one year   

## 2018-03-17 DIAGNOSIS — M4326 Fusion of spine, lumbar region: Secondary | ICD-10-CM | POA: Diagnosis not present

## 2018-03-17 DIAGNOSIS — M7918 Myalgia, other site: Secondary | ICD-10-CM | POA: Diagnosis not present

## 2018-03-17 DIAGNOSIS — M461 Sacroiliitis, not elsewhere classified: Secondary | ICD-10-CM | POA: Diagnosis not present

## 2018-03-17 DIAGNOSIS — Z6830 Body mass index (BMI) 30.0-30.9, adult: Secondary | ICD-10-CM | POA: Diagnosis not present

## 2018-03-17 DIAGNOSIS — I10 Essential (primary) hypertension: Secondary | ICD-10-CM | POA: Diagnosis not present

## 2018-04-18 DIAGNOSIS — R5383 Other fatigue: Secondary | ICD-10-CM | POA: Diagnosis not present

## 2018-04-18 DIAGNOSIS — Z0000 Encounter for general adult medical examination without abnormal findings: Secondary | ICD-10-CM | POA: Diagnosis not present

## 2018-04-18 DIAGNOSIS — I428 Other cardiomyopathies: Secondary | ICD-10-CM | POA: Diagnosis not present

## 2018-04-18 DIAGNOSIS — I10 Essential (primary) hypertension: Secondary | ICD-10-CM | POA: Diagnosis not present

## 2018-04-18 DIAGNOSIS — Z9181 History of falling: Secondary | ICD-10-CM | POA: Diagnosis not present

## 2018-04-18 DIAGNOSIS — Z1331 Encounter for screening for depression: Secondary | ICD-10-CM | POA: Diagnosis not present

## 2018-04-18 DIAGNOSIS — N529 Male erectile dysfunction, unspecified: Secondary | ICD-10-CM | POA: Diagnosis not present

## 2018-04-18 DIAGNOSIS — E785 Hyperlipidemia, unspecified: Secondary | ICD-10-CM | POA: Diagnosis not present

## 2018-04-18 DIAGNOSIS — Z6829 Body mass index (BMI) 29.0-29.9, adult: Secondary | ICD-10-CM | POA: Diagnosis not present

## 2018-04-18 DIAGNOSIS — Z79899 Other long term (current) drug therapy: Secondary | ICD-10-CM | POA: Diagnosis not present

## 2018-04-19 DIAGNOSIS — C44329 Squamous cell carcinoma of skin of other parts of face: Secondary | ICD-10-CM | POA: Diagnosis not present

## 2018-04-19 DIAGNOSIS — L570 Actinic keratosis: Secondary | ICD-10-CM | POA: Diagnosis not present

## 2018-05-04 DIAGNOSIS — H26492 Other secondary cataract, left eye: Secondary | ICD-10-CM | POA: Diagnosis not present

## 2018-05-04 DIAGNOSIS — H4321 Crystalline deposits in vitreous body, right eye: Secondary | ICD-10-CM | POA: Diagnosis not present

## 2018-05-04 DIAGNOSIS — H25811 Combined forms of age-related cataract, right eye: Secondary | ICD-10-CM | POA: Diagnosis not present

## 2018-05-13 DIAGNOSIS — C8208 Follicular lymphoma grade I, lymph nodes of multiple sites: Secondary | ICD-10-CM | POA: Diagnosis not present

## 2018-05-13 DIAGNOSIS — C8338 Diffuse large B-cell lymphoma, lymph nodes of multiple sites: Secondary | ICD-10-CM | POA: Diagnosis not present

## 2018-05-13 DIAGNOSIS — C8298 Follicular lymphoma, unspecified, lymph nodes of multiple sites: Secondary | ICD-10-CM | POA: Diagnosis not present

## 2018-05-13 DIAGNOSIS — R6881 Early satiety: Secondary | ICD-10-CM | POA: Diagnosis not present

## 2018-05-17 DIAGNOSIS — R1310 Dysphagia, unspecified: Secondary | ICD-10-CM | POA: Diagnosis not present

## 2018-05-23 DIAGNOSIS — K228 Other specified diseases of esophagus: Secondary | ICD-10-CM | POA: Diagnosis not present

## 2018-05-23 DIAGNOSIS — R1310 Dysphagia, unspecified: Secondary | ICD-10-CM | POA: Diagnosis not present

## 2018-07-12 DIAGNOSIS — C8590 Non-Hodgkin lymphoma, unspecified, unspecified site: Secondary | ICD-10-CM | POA: Diagnosis not present

## 2018-07-12 DIAGNOSIS — R221 Localized swelling, mass and lump, neck: Secondary | ICD-10-CM | POA: Diagnosis not present

## 2018-07-12 DIAGNOSIS — C8208 Follicular lymphoma grade I, lymph nodes of multiple sites: Secondary | ICD-10-CM | POA: Diagnosis not present

## 2018-07-14 DIAGNOSIS — C8338 Diffuse large B-cell lymphoma, lymph nodes of multiple sites: Secondary | ICD-10-CM | POA: Diagnosis not present

## 2018-07-14 DIAGNOSIS — C8298 Follicular lymphoma, unspecified, lymph nodes of multiple sites: Secondary | ICD-10-CM | POA: Diagnosis not present

## 2018-09-15 DIAGNOSIS — M545 Low back pain: Secondary | ICD-10-CM | POA: Diagnosis not present

## 2018-09-15 DIAGNOSIS — M47896 Other spondylosis, lumbar region: Secondary | ICD-10-CM | POA: Diagnosis not present

## 2018-09-15 DIAGNOSIS — M5136 Other intervertebral disc degeneration, lumbar region: Secondary | ICD-10-CM | POA: Diagnosis not present

## 2018-09-15 DIAGNOSIS — M4326 Fusion of spine, lumbar region: Secondary | ICD-10-CM | POA: Diagnosis not present

## 2018-09-15 DIAGNOSIS — M791 Myalgia, unspecified site: Secondary | ICD-10-CM | POA: Diagnosis not present

## 2018-10-04 DIAGNOSIS — C8208 Follicular lymphoma grade I, lymph nodes of multiple sites: Secondary | ICD-10-CM | POA: Diagnosis not present

## 2018-10-05 DIAGNOSIS — C44612 Basal cell carcinoma of skin of right upper limb, including shoulder: Secondary | ICD-10-CM | POA: Diagnosis not present

## 2018-10-05 DIAGNOSIS — C44622 Squamous cell carcinoma of skin of right upper limb, including shoulder: Secondary | ICD-10-CM | POA: Diagnosis not present

## 2018-10-05 DIAGNOSIS — C8519 Unspecified B-cell lymphoma, extranodal and solid organ sites: Secondary | ICD-10-CM | POA: Diagnosis not present

## 2018-10-05 DIAGNOSIS — D485 Neoplasm of uncertain behavior of skin: Secondary | ICD-10-CM | POA: Diagnosis not present

## 2018-10-20 DIAGNOSIS — C44622 Squamous cell carcinoma of skin of right upper limb, including shoulder: Secondary | ICD-10-CM | POA: Diagnosis not present

## 2018-10-27 DIAGNOSIS — R5381 Other malaise: Secondary | ICD-10-CM | POA: Diagnosis not present

## 2018-10-27 DIAGNOSIS — R5383 Other fatigue: Secondary | ICD-10-CM | POA: Diagnosis not present

## 2018-11-01 DIAGNOSIS — C8208 Follicular lymphoma grade I, lymph nodes of multiple sites: Secondary | ICD-10-CM | POA: Diagnosis not present

## 2018-11-01 DIAGNOSIS — C8338 Diffuse large B-cell lymphoma, lymph nodes of multiple sites: Secondary | ICD-10-CM | POA: Diagnosis not present

## 2018-11-01 DIAGNOSIS — C8298 Follicular lymphoma, unspecified, lymph nodes of multiple sites: Secondary | ICD-10-CM | POA: Diagnosis not present

## 2018-12-30 ENCOUNTER — Other Ambulatory Visit: Payer: Self-pay | Admitting: Sports Medicine

## 2018-12-30 ENCOUNTER — Encounter: Payer: Self-pay | Admitting: Sports Medicine

## 2018-12-30 ENCOUNTER — Ambulatory Visit (INDEPENDENT_AMBULATORY_CARE_PROVIDER_SITE_OTHER): Payer: PPO

## 2018-12-30 ENCOUNTER — Other Ambulatory Visit: Payer: Self-pay

## 2018-12-30 ENCOUNTER — Ambulatory Visit (INDEPENDENT_AMBULATORY_CARE_PROVIDER_SITE_OTHER): Payer: PPO | Admitting: Sports Medicine

## 2018-12-30 DIAGNOSIS — M79671 Pain in right foot: Secondary | ICD-10-CM

## 2018-12-30 DIAGNOSIS — M1990 Unspecified osteoarthritis, unspecified site: Secondary | ICD-10-CM | POA: Diagnosis not present

## 2018-12-30 DIAGNOSIS — M2042 Other hammer toe(s) (acquired), left foot: Secondary | ICD-10-CM

## 2018-12-30 DIAGNOSIS — M792 Neuralgia and neuritis, unspecified: Secondary | ICD-10-CM

## 2018-12-30 DIAGNOSIS — M79672 Pain in left foot: Secondary | ICD-10-CM

## 2018-12-30 DIAGNOSIS — M722 Plantar fascial fibromatosis: Secondary | ICD-10-CM

## 2018-12-30 NOTE — Progress Notes (Signed)
Subjective: Sean Hodges is a 78 y.o. male patient who presents to office for evaluation of left greater than right foot pain. Patient complains of progressive pain especially over the last 6 months of increased burning tingling that is worse with walking and in the morning also reports that he has specific pain at the tip of the left third toe and has noticed that his third toe is curling down and wants to discuss other treatment options. Denies injury/trip/fall/sprain/any causative factors.   Patient Active Problem List   Diagnosis Date Noted  . Arthritis 07/03/2016  . Benign prostatic hyperplasia 07/03/2016  . Cancer ([place]) 07/03/2016  . DDD (degenerative disc disease), lumbar 07/03/2016  . Disease of thyroid gland 07/03/2016  . GERD (gastroesophageal reflux disease) 07/03/2016  . Lumbar stenosis 07/03/2016  . Spondylosis of lumbar joint 07/03/2016  . Port catheter in place 10/29/2015  . Nonischemic cardiomyopathy ([place]) 03/07/2015  . S/P left knee arthroscopy 11/26/2014  . Loose body of left knee 11/20/2014  . Left knee pain 11/15/2014  . Dyspnea on exertion 08/30/2014  . Precordial chest pain 08/30/2014  . Hypertension   . Hyperlipidemia   . Neuropathy due to chemotherapeutic drug ([place]) 09/17/2013  . Follicular lymphoma ([place]) 07/29/2011    Current Outpatient Medications on File Prior to Visit  Medication Sig Dispense Refill  . calcium & magnesium carbonates (MYLANTA) 311-232 MG per tablet Take 1 tablet by mouth 2 (two) times daily.    . Cholecalciferol (VITAMIN D3 PO) Take 500 Units by mouth 2 (two) times daily.    Marland Kitchen COENZYME Q-10 PO Take 50 mg by mouth 2 (two) times daily.     . DOCOSAHEXAENOIC ACID PO Take 1 g by mouth daily.     . enalapril (VASOTEC) 10 MG tablet Take 10 mg by mouth daily.  0  . fish oil-omega-3 fatty acids 1000 MG capsule Take 1 g by mouth daily.    Marland Kitchen glucosamine-chondroitin 500-400 MG tablet Take 1 tablet by mouth 2 (two) times daily.     Marland Kitchen omeprazole  (PRILOSEC) 40 MG capsule Take 40 mg by mouth daily.  11  . pravastatin (PRAVACHOL) 80 MG tablet Take 80 mg by mouth daily.   1  . ranitidine (ZANTAC) 150 MG capsule Take 150 mg by mouth every evening.    . saw palmetto 500 MG capsule Take 500 mg by mouth 2 (two) times daily.    . vitamin B-12 (CYANOCOBALAMIN) 1000 MCG tablet Take 1,000 mcg by mouth daily.      Current Facility-Administered Medications on File Prior to Visit  Medication Dose Route Frequency Provider Last Rate Last Dose  . triamcinolone acetonide (KENALOG-40) injection 20 mg  20 mg Other Once Landis Martins, DPM        Allergies  Allergen Reactions  . Gabapentin Nausea Only and Other (See Comments)    dizziness  . Lipitor [Atorvastatin] Other (See Comments)    Myalgias     Objective:  General: Alert and oriented x3 in no acute distress  Dermatology: Surgical scars well-healed left toe.  No open lesions bilateral lower extremities, no webspace macerations, no ecchymosis bilateral, all nails x 10 are well manicured there is very minimal callus noted at the distal tuft of the left third toe..  Vascular: Dorsalis Pedis and Posterior Tibial pedal pulses palpable, Capillary Fill Time 5 seconds,(+) pedal hair growth bilateral, no edema bilateral lower extremities, Temperature gradient within normal limits.  Varicosities present bilateral.  Neurology: Gross sensation intact via light touch  bilateral, subjective burning and tingling sensation to feet and hands likely consistent with neuropathy with a history of chemo.  Musculoskeletal: Mild tenderness with palpation at left third toe with diffuse bony prominences noted supportive of arthritis at the first metatarsophalangeal joint and midfoot bilateral, there is mild limitation in joint range of motion due to patient status.  Gait: Minimally antalgic gait  Xrays  Left foot diffuse arthritis and lesser hammertoe contracture   Impression:  Assessment and Plan: Problem List  Items Addressed This Visit      Musculoskeletal and Integument   Arthritis - Primary    Other Visit Diagnoses    Hammertoe of left foot       Neuritis       Foot pain, bilateral           -Complete examination performed -Xrays reviewed -Discussed treatement options for pain at left third hammertoe neuritis and arthritis -Dispensed toe crest pad for patient to use on left and recommended good supportive shoes and power steps -Advised patient that for his nerve pain will be better suited using a topical pain cream since he has had a previous adverse reaction to gabapentin and has a very significant altering history of vertigo -Patient to return to office as needed or sooner if condition worsens.  Landis Martins, DPM

## 2019-01-09 ENCOUNTER — Other Ambulatory Visit: Payer: Self-pay | Admitting: Sports Medicine

## 2019-01-09 DIAGNOSIS — M2042 Other hammer toe(s) (acquired), left foot: Secondary | ICD-10-CM

## 2019-01-09 DIAGNOSIS — M79672 Pain in left foot: Secondary | ICD-10-CM

## 2019-01-09 DIAGNOSIS — M1990 Unspecified osteoarthritis, unspecified site: Secondary | ICD-10-CM

## 2019-01-12 DIAGNOSIS — M545 Low back pain: Secondary | ICD-10-CM | POA: Diagnosis not present

## 2019-01-24 DIAGNOSIS — I428 Other cardiomyopathies: Secondary | ICD-10-CM | POA: Diagnosis not present

## 2019-01-24 DIAGNOSIS — E7800 Pure hypercholesterolemia, unspecified: Secondary | ICD-10-CM | POA: Diagnosis not present

## 2019-01-24 DIAGNOSIS — I10 Essential (primary) hypertension: Secondary | ICD-10-CM | POA: Diagnosis not present

## 2019-01-24 DIAGNOSIS — K219 Gastro-esophageal reflux disease without esophagitis: Secondary | ICD-10-CM | POA: Diagnosis not present

## 2019-01-24 DIAGNOSIS — Z6828 Body mass index (BMI) 28.0-28.9, adult: Secondary | ICD-10-CM | POA: Diagnosis not present

## 2019-02-13 DIAGNOSIS — M545 Low back pain: Secondary | ICD-10-CM | POA: Diagnosis not present

## 2019-03-04 DIAGNOSIS — Z23 Encounter for immunization: Secondary | ICD-10-CM | POA: Diagnosis not present

## 2019-03-06 DIAGNOSIS — C8338 Diffuse large B-cell lymphoma, lymph nodes of multiple sites: Secondary | ICD-10-CM | POA: Diagnosis not present

## 2019-03-06 DIAGNOSIS — C8208 Follicular lymphoma grade I, lymph nodes of multiple sites: Secondary | ICD-10-CM | POA: Diagnosis not present

## 2019-03-20 NOTE — Progress Notes (Signed)
Cardiology Office Note    Date:  03/23/2019   ID:  Sean Hodges, DOB June 06, 1941, MRN [id number]  PCP:  [name], MD  Cardiologist:  Dr. Martinique   Chief Complaint  Patient presents with  . Congestive Heart Failure    History of Present Illness:  Sean Hodges is a 78 y.o. male with PMH of NICM, HTN, HLD and non-Hodgkin's lymphoma (received CHOP chemo in 2000).  He had a history of low EF following chemotherapy with CHOP in the past.  Stress echo at [facility] in March 2016 showed resting EF 40-45%, normal wall motion, EF improved to 65% with exercise.  Echo in September 2016 showed EF 50-55%.  He had a recurrence of non-Hodgkin's lymphoma in 2008 was treated with a different chemo therapy between 2008 and 2009.    He underwent lumbar laminectomy in April 2019.  He reports 2 episodes of bronchitis in June.  On follow up today he is seen  with his wife.  He states his lymphoma is fairly stable with some skin involvement.  Continuing to monitor at this time and no chemo recommended yet.   He denies any chest pain or shortness of breath. No edema.   He has lost 11 lbs which he attributes to less sweets and Etoh.    Past Medical History:  Diagnosis Date  . Arthritis    knee osteoarthritis  . Cancer (HCC)    nhl  . Hypercholesterolemia   . Hypertension   . Lymphoma ([place])   . Nonischemic cardiomyopathy ([place]) 03/07/2015    Past Surgical History:  Procedure Laterality Date  . arthroscopies Bilateral   . Left eye surgery  2013  . THYROIDECTOMY, PARTIAL Left   . vocal cord     polyps removed    Current Medications: Outpatient Medications Prior to Visit  Medication Sig Dispense Refill  . calcium & magnesium carbonates (MYLANTA) 311-232 MG per tablet Take 1 tablet by mouth 2 (two) times daily.    . Cholecalciferol (VITAMIN D3 PO) Take 500 Units by mouth 2 (two) times daily.    Marland Kitchen COENZYME Q-10 PO Take 50 mg by mouth 2 (two) times daily.     .  DOCOSAHEXAENOIC ACID PO Take 1 g by mouth daily.     . enalapril (VASOTEC) 10 MG tablet Take 10 mg by mouth daily.  0  . famotidine (PEPCID) 40 MG tablet Take 40 mg by mouth at bedtime.    . fish oil-omega-3 fatty acids 1000 MG capsule Take 1 g by mouth daily.    Marland Kitchen glucosamine-chondroitin 500-400 MG tablet Take 1 tablet by mouth 2 (two) times daily.     Marland Kitchen omeprazole (PRILOSEC) 40 MG capsule Take 40 mg by mouth daily.  11  . pravastatin (PRAVACHOL) 80 MG tablet Take 80 mg by mouth daily.   1  . saw palmetto 500 MG capsule Take 500 mg by mouth 2 (two) times daily.    . vitamin B-12 (CYANOCOBALAMIN) 1000 MCG tablet Take 1,000 mcg by mouth daily.     . ranitidine (ZANTAC) 150 MG capsule Take 150 mg by mouth every evening.     Facility-Administered Medications Prior to Visit  Medication Dose Route Frequency Provider Last Rate Last Dose  . triamcinolone acetonide (KENALOG-40) injection 20 mg  20 mg Other Once Landis Martins, DPM         Allergies:   Gabapentin and Lipitor [atorvastatin]   Social History   Socioeconomic History  .  Marital status: Married    Spouse name: Not on file  . Number of children: 3  . Years of education: Not on file  . Highest education level: Not on file  Occupational History  . Occupation: retired-engineer  Social Needs  . Financial resource strain: Not on file  . Food insecurity    Worry: Not on file    Inability: Not on file  . Transportation needs    Medical: Not on file    Non-medical: Not on file  Tobacco Use  . Smoking status: Former Smoker    Quit date: 01/20/1974    Years since quitting: 45.2  . Smokeless tobacco: Former Systems developer    Quit date: 06/22/1973  Substance and Sexual Activity  . Alcohol use: Never    Frequency: Never  . Drug use: Never  . Sexual activity: Not on file  Lifestyle  . Physical activity    Days per week: Not on file    Minutes per session: Not on file  . Stress: Not on file  Relationships  . Social Herbalist on  phone: Not on file    Gets together: Not on file    Attends religious service: Not on file    Active member of club or organization: Not on file    Attends meetings of clubs or organizations: Not on file    Relationship status: Not on file  Other Topics Concern  . 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 3 [place], 2 in [place]     Family History:  The patient's family history includes Cancer in his brother, brother, brother, brother, and brother.   ROS:   Please see the history of present illness.    ROS All other systems reviewed and are negative.   PHYSICAL EXAM:   VS:  BP (!) 143/74   Pulse (!) 57   Temp 98.1 F (36.7 C)   Ht 5\' 9"  (1.753 m)   Wt 197 lb 6.4 oz (89.5 kg)   SpO2 95%   BMI 29.15 kg/m    GENERAL:  Well appearing overweight WM in NAD HEENT:  PERRL, EOMI, sclera are clear. Oropharynx is clear. NECK:  No jugular venous distention, carotid upstroke brisk and symmetric, no bruits, no thyromegaly or adenopathy LUNGS:  Clear to auscultation bilaterally CHEST:  Unremarkable HEART:  RRR,  PMI not displaced or sustained,S1 and S2 within normal limits, no S3, no S4: no clicks, no rubs, no murmurs ABD:  Soft, nontender. BS +, no masses or bruits. No hepatomegaly, no splenomegaly EXT:  2 + pulses throughout, no edema, no cyanosis no clubbing. Skin is dry and scaly. SKIN:  Warm and dry.  No rashes NEURO:  Alert and oriented x 3. Cranial nerves II through XII intact. PSYCH:  Cognitively intact    Wt Readings from Last 3 Encounters:  03/23/19 197 lb 6.4 oz (89.5 kg)  03/11/18 204 lb 12.8 oz (92.9 kg)  06/10/17 208 lb 9.6 oz (94.6 kg)      Studies/Labs Reviewed:   EKG:  EKG is  ordered today. Sinus brady rate 58. Normal. I have personally reviewed and interpreted this study.   Recent Labs: No  results found for requested labs within last 8760 hours.   Lipid Panel    Component Value Date/Time  CHOL 152 08/30/2014 1028   TRIG 90 08/30/2014 1028   HDL 39 (L) 08/30/2014 1028   CHOLHDL 3.9 08/30/2014 1028   VLDL 18 08/30/2014 1028   LDLCALC 95 08/30/2014 1028   Labs dated 03/22/17: cholesterol 171, triglycerides 148, HDL 35, LDL 106. Creatinine 1.09. Otherwise chemistries and CBC normal. Dated 04/18/18: cholesterol 179, triglycerides 194, HDL 37, LDL 103.  Dated 10/27/18: creatinine 1.2 otherwise chemistries and Hgb normal.   Additional studies/ records that were reviewed today include:   Echo 02/28/2015 LV EF: 50% -   55%  Study Conclusions  - Left ventricle: The cavity size was mildly dilated. Systolic   function was normal. The estimated ejection fraction was in the   range of 50% to 55%. Wall motion was normal; there were no   regional wall motion abnormalities. - Aortic valve: There was mild regurgitation. - Left atrium: The atrium was mildly dilated. - Atrial septum: No defect or patent foramen ovale was identified.   ASSESSMENT:    No diagnosis found.   PLAN:  In order of problems listed above:  1. NICM: asymptomatic.  Last echocardiogram showed EF 50-55% in 2016.   2. Non-Hodgkin's lymphoma-  Continue to monitor- followed by Dr Bobby Rumpf in [place], he will need to let us know if he restarted on chemotherapy especially if there is cardiotoxic potential  3. Hypertension: controlled on current therapy  4.   Hyperlipidemia: well controlled  5.    Coronary calcification without symptoms of angina. Continue risk factor modification.     Medication Adjustments/Labs and Tests Ordered: Current medicines are reviewed at length with the patient today.  Concerns regarding medicines are outlined above.  Medication changes, Labs and Tests ordered today are listed in the Patient Instructions below. There are no Patient Instructions on file for this visit.   Signed,  Yasira Engelson [place], MD  03/23/2019 11:00 AM    [facility] [address], [place], [place]  [address] Phone: [phone]; Fax: [phone]

## 2019-03-23 ENCOUNTER — Other Ambulatory Visit: Payer: Self-pay

## 2019-03-23 ENCOUNTER — Encounter: Payer: Self-pay | Admitting: Cardiology

## 2019-03-23 ENCOUNTER — Ambulatory Visit (INDEPENDENT_AMBULATORY_CARE_PROVIDER_SITE_OTHER): Payer: PPO | Admitting: Cardiology

## 2019-03-23 VITALS — BP 143/74 | HR 57 | Temp 98.1°F | Ht 69.0 in | Wt 197.4 lb

## 2019-03-23 DIAGNOSIS — I2510 Atherosclerotic heart disease of native coronary artery without angina pectoris: Secondary | ICD-10-CM | POA: Diagnosis not present

## 2019-03-23 DIAGNOSIS — I10 Essential (primary) hypertension: Secondary | ICD-10-CM | POA: Diagnosis not present

## 2019-03-23 DIAGNOSIS — I428 Other cardiomyopathies: Secondary | ICD-10-CM

## 2019-03-23 DIAGNOSIS — I2584 Coronary atherosclerosis due to calcified coronary lesion: Secondary | ICD-10-CM

## 2019-03-23 DIAGNOSIS — E785 Hyperlipidemia, unspecified: Secondary | ICD-10-CM | POA: Diagnosis not present

## 2019-03-25 DIAGNOSIS — L821 Other seborrheic keratosis: Secondary | ICD-10-CM | POA: Diagnosis not present

## 2019-03-25 DIAGNOSIS — L570 Actinic keratosis: Secondary | ICD-10-CM | POA: Diagnosis not present

## 2019-03-25 DIAGNOSIS — L700 Acne vulgaris: Secondary | ICD-10-CM | POA: Diagnosis not present

## 2019-03-25 DIAGNOSIS — L814 Other melanin hyperpigmentation: Secondary | ICD-10-CM | POA: Diagnosis not present

## 2019-04-08 DIAGNOSIS — R05 Cough: Secondary | ICD-10-CM | POA: Diagnosis not present

## 2019-04-08 DIAGNOSIS — Z20828 Contact with and (suspected) exposure to other viral communicable diseases: Secondary | ICD-10-CM | POA: Diagnosis not present

## 2019-04-08 DIAGNOSIS — R509 Fever, unspecified: Secondary | ICD-10-CM | POA: Diagnosis not present

## 2019-04-22 ENCOUNTER — Encounter

## 2019-04-26 DIAGNOSIS — Z0000 Encounter for general adult medical examination without abnormal findings: Secondary | ICD-10-CM | POA: Diagnosis not present

## 2019-04-26 DIAGNOSIS — Z1331 Encounter for screening for depression: Secondary | ICD-10-CM | POA: Diagnosis not present

## 2019-04-26 DIAGNOSIS — R519 Headache, unspecified: Secondary | ICD-10-CM | POA: Diagnosis not present

## 2019-04-26 DIAGNOSIS — N529 Male erectile dysfunction, unspecified: Secondary | ICD-10-CM | POA: Diagnosis not present

## 2019-04-26 DIAGNOSIS — I428 Other cardiomyopathies: Secondary | ICD-10-CM | POA: Diagnosis not present

## 2019-04-26 DIAGNOSIS — Z9181 History of falling: Secondary | ICD-10-CM | POA: Diagnosis not present

## 2019-04-26 DIAGNOSIS — Z6828 Body mass index (BMI) 28.0-28.9, adult: Secondary | ICD-10-CM | POA: Diagnosis not present

## 2019-05-17 ENCOUNTER — Other Ambulatory Visit: Payer: Self-pay

## 2019-05-17 DIAGNOSIS — H2129 Other iris atrophy: Secondary | ICD-10-CM | POA: Diagnosis not present

## 2019-05-17 DIAGNOSIS — H11441 Conjunctival cysts, right eye: Secondary | ICD-10-CM | POA: Diagnosis not present

## 2019-05-17 DIAGNOSIS — Z961 Presence of intraocular lens: Secondary | ICD-10-CM | POA: Diagnosis not present

## 2019-05-17 DIAGNOSIS — H25811 Combined forms of age-related cataract, right eye: Secondary | ICD-10-CM | POA: Diagnosis not present

## 2019-07-05 DIAGNOSIS — H01009 Unspecified blepharitis unspecified eye, unspecified eyelid: Secondary | ICD-10-CM | POA: Diagnosis not present

## 2019-07-05 DIAGNOSIS — H11441 Conjunctival cysts, right eye: Secondary | ICD-10-CM | POA: Diagnosis not present

## 2019-07-06 DIAGNOSIS — C8208 Follicular lymphoma grade I, lymph nodes of multiple sites: Secondary | ICD-10-CM | POA: Diagnosis not present

## 2019-09-26 DIAGNOSIS — L570 Actinic keratosis: Secondary | ICD-10-CM | POA: Diagnosis not present

## 2019-09-26 DIAGNOSIS — D045 Carcinoma in situ of skin of trunk: Secondary | ICD-10-CM | POA: Diagnosis not present

## 2019-10-17 DIAGNOSIS — L720 Epidermal cyst: Secondary | ICD-10-CM | POA: Diagnosis not present

## 2019-10-30 DIAGNOSIS — C8208 Follicular lymphoma grade I, lymph nodes of multiple sites: Secondary | ICD-10-CM | POA: Diagnosis not present

## 2020-01-10 DIAGNOSIS — C44519 Basal cell carcinoma of skin of other part of trunk: Secondary | ICD-10-CM | POA: Diagnosis not present

## 2020-01-16 ENCOUNTER — Other Ambulatory Visit: Payer: Self-pay

## 2020-02-08 DIAGNOSIS — C44519 Basal cell carcinoma of skin of other part of trunk: Secondary | ICD-10-CM | POA: Diagnosis not present

## 2020-02-28 DIAGNOSIS — C44629 Squamous cell carcinoma of skin of left upper limb, including shoulder: Secondary | ICD-10-CM | POA: Diagnosis not present

## 2020-02-28 DIAGNOSIS — L821 Other seborrheic keratosis: Secondary | ICD-10-CM | POA: Diagnosis not present

## 2020-02-28 DIAGNOSIS — L570 Actinic keratosis: Secondary | ICD-10-CM | POA: Diagnosis not present

## 2020-03-18 DIAGNOSIS — C44629 Squamous cell carcinoma of skin of left upper limb, including shoulder: Secondary | ICD-10-CM | POA: Diagnosis not present

## 2020-03-18 NOTE — Progress Notes (Signed)
Cardiology Office Note    Date:  03/22/2020   ID:  Sean Hodges, DOB 11/13/1940, MRN [id number]  PCP:  [name], MD  Cardiologist:  Dr. Martinique   Chief Complaint  Patient presents with  . Congestive Heart Failure    History of Present Illness:  Sean Hodges is a 79 y.o. male with PMH of NICM, HTN, HLD and non-Hodgkin's lymphoma (received CHOP chemo in 2000).  He had a history of low EF following chemotherapy with CHOP in the past.  Stress echo at [facility] in March 2016 showed resting EF 40-45%, normal wall motion, EF improved to 65% with exercise.  Echo in September 2016 showed EF 50-55%.  He had a recurrence of non-Hodgkin's lymphoma in 2008 was treated with a different chemo therapy between 2008 and 2009.    He underwent lumbar laminectomy in April 2019.  He reports 2 episodes of bronchitis in June.  On follow up today he is seen  with his wife.  He is doing very well.   Continuing to monitor his lymphoma at this time and no chemo recommended yet.   He denies any chest pain or shortness of breath. No edema.      Past Medical History:  Diagnosis Date  . Arthritis    knee osteoarthritis  . Cancer (HCC)    nhl  . Hypercholesterolemia   . Hypertension   . Lymphoma ([place])   . Nonischemic cardiomyopathy ([place]) 03/07/2015    Past Surgical History:  Procedure Laterality Date  . arthroscopies Bilateral   . Left eye surgery  2013  . THYROIDECTOMY, PARTIAL Left   . vocal cord     polyps removed    Current Medications: Outpatient Medications Prior to Visit  Medication Sig Dispense Refill  . calcium & magnesium carbonates (MYLANTA) 311-232 MG per tablet Take 1 tablet by mouth 2 (two) times daily.    . Cholecalciferol (VITAMIN D3 PO) Take 500 Units by mouth 2 (two) times daily.    Marland Kitchen COENZYME Q-10 PO Take 50 mg by mouth 2 (two) times daily.     . DOCOSAHEXAENOIC ACID PO Take 1 g by mouth daily.     . enalapril (VASOTEC) 10 MG tablet Take 10 mg  by mouth daily.  0  . famotidine (PEPCID) 40 MG tablet Take 40 mg by mouth at bedtime.    . fish oil-omega-3 fatty acids 1000 MG capsule Take 1 g by mouth daily.    Marland Kitchen glucosamine-chondroitin 500-400 MG tablet Take 1 tablet by mouth 2 (two) times daily.     . pravastatin (PRAVACHOL) 80 MG tablet Take 80 mg by mouth daily.   1  . saw palmetto 500 MG capsule Take 500 mg by mouth 2 (two) times daily.    . vitamin B-12 (CYANOCOBALAMIN) 1000 MCG tablet Take 1,000 mcg by mouth daily.     Marland Kitchen omeprazole (PRILOSEC) 40 MG capsule Take 40 mg by mouth daily.  11   Facility-Administered Medications Prior to Visit  Medication Dose Route Frequency Provider Last Rate Last Admin  . triamcinolone acetonide (KENALOG-40) injection 20 mg  20 mg Other Once Landis Martins, DPM         Allergies:   Gabapentin and Lipitor [atorvastatin]   Social History   Socioeconomic History  . Marital status: Married    Spouse name: Not on file  . Number of children: 3  . Years of education: Not on file  . Highest education level: Not on  file  Occupational History  . Occupation: retired-engineer  Tobacco Use  . Smoking status: Former Smoker    Quit date: 01/20/1974    Years since quitting: 46.2  . Smokeless tobacco: Former Systems developer    Quit date: 06/22/1973  Vaping Use  . Vaping Use: Never used  Substance and Sexual Activity  . Alcohol use: Never  . Drug use: Never  . Sexual activity: Not on file  Other Topics Concern  . 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 3 children [place] in [place], 2 in [place]   Social Determinants of Health   Financial Resource Strain:   . Difficulty of Paying Living Expenses: Not on file  Food Insecurity:   . Worried About Charity fundraiser in the Last Year: Not on file  . Ran Out of Food in the Last Year: Not on file  Transportation  Needs:   . Lack of Transportation (Medical): Not on file  . Lack of Transportation (Non-Medical): Not on file  Physical Activity:   . Days of Exercise per Week: Not on file  . Minutes of Exercise per Session: Not on file  Stress:   . Feeling of Stress : Not on file  Social Connections:   . Frequency of Communication with Friends and Family: Not on file  . Frequency of Social Gatherings with Friends and Family: Not on file  . Attends Religious Services: Not on file  . Active Member of Clubs or Organizations: Not on file  . Attends Archivist Meetings: Not on file  . Marital Status: Not on file     Family History:  The patient's family history includes Cancer in his brother, brother, brother, brother, and brother.   ROS:   Please see the history of present illness.    ROS All other systems reviewed and are negative.   PHYSICAL EXAM:   VS:  BP 120/66   Pulse 62   Ht 5\' 9"  (1.753 m)   Wt 196 lb 3.2 oz (89 kg)   SpO2 96%   BMI 28.97 kg/m    GENERAL:  Well appearing overweight WM in NAD HEENT:  PERRL, EOMI, sclera are clear. Oropharynx is clear. NECK:  No jugular venous distention, carotid upstroke brisk and symmetric, no bruits, no thyromegaly or adenopathy LUNGS:  Clear to auscultation bilaterally CHEST:  Unremarkable HEART:  RRR,  PMI not displaced or sustained,S1 and S2 within normal limits, no S3, no S4: no clicks, no rubs, no murmurs ABD:  Soft, nontender. BS +, no masses or bruits. No hepatomegaly, no splenomegaly EXT:  2 + pulses throughout, no edema, no cyanosis no clubbing. Skin is dry and scaly. SKIN:  Warm and dry.  No rashes NEURO:  Alert and oriented x 3. Cranial nerves II through XII intact. PSYCH:  Cognitively intact    Wt Readings from Last 3 Encounters:  03/22/20 196 lb 3.2 oz (89 kg)  03/23/19 197 lb 6.4 oz (89.5 kg)  03/11/18 204 lb 12.8 oz (92.9 kg)      Studies/Labs Reviewed:   EKG:  EKG is  ordered today. NSR rate 62. Normal.  Normal.  I have personally reviewed and interpreted this study.   Recent Labs: No results found for requested labs within last 8760 hours.   Lipid Panel    Component Value Date/Time  CHOL 152 08/30/2014 1028   TRIG 90 08/30/2014 1028   HDL 39 (L) 08/30/2014 1028   CHOLHDL 3.9 08/30/2014 1028   VLDL 18 08/30/2014 1028   LDLCALC 95 08/30/2014 1028   Labs dated 03/22/17: cholesterol 171, triglycerides 148, HDL 35, LDL 106. Creatinine 1.09. Otherwise chemistries and CBC normal. Dated 04/18/18: cholesterol 179, triglycerides 194, HDL 37, LDL 103.  Dated 10/27/18: creatinine 1.2 otherwise chemistries and Hgb normal.   Additional studies/ records that were reviewed today include:   Echo 02/28/2015 LV EF: 50% -   55%  Study Conclusions  - Left ventricle: The cavity size was mildly dilated. Systolic   function was normal. The estimated ejection fraction was in the   range of 50% to 55%. Wall motion was normal; there were no   regional wall motion abnormalities. - Aortic valve: There was mild regurgitation. - Left atrium: The atrium was mildly dilated. - Atrial septum: No defect or patent foramen ovale was identified.   ASSESSMENT:    1. NICM (nonischemic cardiomyopathy) ([place])   2. Essential hypertension   3. Hyperlipidemia, unspecified hyperlipidemia type   4. Coronary artery calcification      PLAN:  In order of problems listed above:  1. NICM: asymptomatic.  Last echocardiogram showed EF 50-55% in 2016.   2. Non-Hodgkin's lymphoma-  Continue to monitor- followed by Dr Bobby Rumpf in [place].  3. Hypertension: controlled on current therapy  4.   Hyperlipidemia: well controlled  5.    Coronary calcification without symptoms of angina. Continue risk factor modification.     Medication Adjustments/Labs and Tests Ordered: Current medicines are reviewed at length with the patient today.  Concerns regarding medicines are outlined above.  Medication changes, Labs and Tests ordered  today are listed in the Patient Instructions below. There are no Patient Instructions on file for this visit.   Signed, Jameison Haji Martinique, MD  03/22/2020 3:13 PM    [facility] [place], [place], [place]  [address] Phone: [phone]; Fax: [phone]

## 2020-03-22 ENCOUNTER — Encounter: Payer: Self-pay | Admitting: Cardiology

## 2020-03-22 ENCOUNTER — Other Ambulatory Visit: Payer: Self-pay

## 2020-03-22 ENCOUNTER — Ambulatory Visit (INDEPENDENT_AMBULATORY_CARE_PROVIDER_SITE_OTHER): Payer: PPO | Admitting: Cardiology

## 2020-03-22 VITALS — BP 120/66 | HR 62 | Ht 69.0 in | Wt 196.2 lb

## 2020-03-22 DIAGNOSIS — I2584 Coronary atherosclerosis due to calcified coronary lesion: Secondary | ICD-10-CM

## 2020-03-22 DIAGNOSIS — I10 Essential (primary) hypertension: Secondary | ICD-10-CM | POA: Diagnosis not present

## 2020-03-22 DIAGNOSIS — I2510 Atherosclerotic heart disease of native coronary artery without angina pectoris: Secondary | ICD-10-CM

## 2020-03-22 DIAGNOSIS — I428 Other cardiomyopathies: Secondary | ICD-10-CM | POA: Diagnosis not present

## 2020-03-22 DIAGNOSIS — E785 Hyperlipidemia, unspecified: Secondary | ICD-10-CM | POA: Diagnosis not present

## 2020-04-19 ENCOUNTER — Other Ambulatory Visit: Payer: Self-pay | Admitting: Hematology and Oncology

## 2020-04-19 DIAGNOSIS — C8208 Follicular lymphoma grade I, lymph nodes of multiple sites: Secondary | ICD-10-CM

## 2020-04-28 ENCOUNTER — Other Ambulatory Visit: Payer: Self-pay | Admitting: Oncology

## 2020-04-28 DIAGNOSIS — C8208 Follicular lymphoma grade I, lymph nodes of multiple sites: Secondary | ICD-10-CM

## 2020-04-28 NOTE — Progress Notes (Signed)
[facility]  [address],  [place]  [address] [phone]  Clinic Day:  04/29/2020  Referring physician: [name], MD   HISTORY OF PRESENT ILLNESS:  The patient is an 79 y.o. male with a long history of follicular lymphoma and diffuse large B cell lymphoma.  His diffuse large B-cell lymphoma was initially diagnosed in May 2000 per biopsy of a mass in his left thyroid.  As CT scans showed evidence of disease above and below his diaphragm, he was given 6 cycles of CHOP chemotherapy, followed by weekly Rituxan for 8 treatments.  His disease did well up until August 2008 when CT and PET scans showed evidence of disease recurrence.  At that time, a repeat biopsy showed follicular lymphoma, which led to him undergoing 6 cycles of Rituxan-CVP.  The plan was to continue maintenance Rituxan for 2 years, but the patient only received 1 cycle of maintenance Rituxan before discontinuing it in early 2009.  He has not required any systemic therapy since then.  The patient has had biopsies and resections of a fleshy skin nodule behind his right ear, whose pathology revealed follicular lymphoma, but with focal areas within it suggesting potential transformation into a more aggressive subtype.  As exams and scans have shown no evidence of diffuse disease, his disease has been followed conservatively.  He comes in today for routine follow-up.  Since his last visit, the patient has been doing well.  He continues to deny having any B symptoms, bulky lymphadenopathy, or ominous skin changes which concern him for overt disease progression.    PHYSICAL EXAM:  Blood pressure (!) 156/77, pulse 74, temperature 97.9 F (36.6 C), temperature source Oral, resp. rate 16, height 5' 9.25" (1.759 m), weight 198 lb 11.2 oz (90.1 kg), SpO2 97 %. Wt Readings from Last 3 Encounters:  04/29/20 198 lb 11.2 oz (90.1 kg)  03/22/20 196 lb 3.2 oz (89 kg)  03/23/19 197 lb 6.4 oz (89.5  kg)   Body mass index is 29.13 kg/m. Performance status (ECOG): 1 Physical Exam Constitutional:      Appearance: Normal appearance. He is not ill-appearing.  HENT:     Mouth/Throat:     Mouth: Mucous membranes are moist.     Pharynx: Oropharynx is clear. No oropharyngeal exudate or posterior oropharyngeal erythema.  Cardiovascular:     Rate and Rhythm: Normal rate and regular rhythm.     Heart sounds: No murmur heard.  No friction rub. No gallop.   Pulmonary:     Effort: Pulmonary effort is normal. No respiratory distress.     Breath sounds: Normal breath sounds. No wheezing, rhonchi or rales.  Abdominal:     General: Bowel sounds are normal. There is no distension.     Palpations: Abdomen is soft. There is no mass.     Tenderness: There is no abdominal tenderness.  Musculoskeletal:        General: No swelling.     Right lower leg: No edema.     Left lower leg: No edema.  Lymphadenopathy:     Cervical: No cervical adenopathy.     Upper Body:     Right upper body: No supraclavicular or axillary adenopathy.     Left upper body: No supraclavicular or axillary adenopathy.     Lower Body: No right inguinal adenopathy. No left inguinal adenopathy.  Skin:    General: Skin is warm.     Coloration: Skin is not jaundiced.  Findings: No lesion or rash.  Neurological:     General: No focal deficit present.     Mental Status: He is alert and oriented to person, place, and time. Mental status is at baseline.     Cranial Nerves: Cranial nerves are intact.  Psychiatric:        Mood and Affect: Mood normal.        Behavior: Behavior normal.        Thought Content: Thought content normal.     LABS:     Ref. Range 04/29/2020 13:29  LDH Latest Ref Range: 98 - 192 U/L 124    ASSESSMENT & PLAN:   Assessment/Plan:  An 79 y.o. male with a history of both diffuse large B-cell lymphoma and low-grade follicular lymphoma.  Based upon his labs and physical exam today, there is nothing  which suggests there is late disease recurrence/progression.  I am also comforted by the fact that his LDH level remains normal.  Clinically, the patient is doing very well.  As that is the case, I will see him back in another 6 months for repeat clinical assessment.  Per his wishes, a PET scan will be done a day before his next visit to ensure there is no radiographic evidence of disease progression.  The patient understands all the plans discussed today and is in agreement with them.      Khamiyah Grefe Macarthur Critchley, MD

## 2020-04-29 ENCOUNTER — Other Ambulatory Visit: Payer: Self-pay

## 2020-04-29 ENCOUNTER — Inpatient Hospital Stay (INDEPENDENT_AMBULATORY_CARE_PROVIDER_SITE_OTHER): Payer: PPO | Admitting: Oncology

## 2020-04-29 ENCOUNTER — Other Ambulatory Visit: Payer: Self-pay | Admitting: Oncology

## 2020-04-29 ENCOUNTER — Encounter: Payer: Self-pay | Admitting: Oncology

## 2020-04-29 ENCOUNTER — Inpatient Hospital Stay: Payer: PPO

## 2020-04-29 ENCOUNTER — Telehealth: Payer: Self-pay | Admitting: Oncology

## 2020-04-29 ENCOUNTER — Inpatient Hospital Stay: Payer: PPO | Attending: Oncology | Admitting: Hematology and Oncology

## 2020-04-29 VITALS — BP 156/77 | HR 74 | Temp 97.9°F | Resp 16 | Ht 69.25 in | Wt 198.7 lb

## 2020-04-29 DIAGNOSIS — Z0001 Encounter for general adult medical examination with abnormal findings: Secondary | ICD-10-CM | POA: Diagnosis not present

## 2020-04-29 DIAGNOSIS — C8208 Follicular lymphoma grade I, lymph nodes of multiple sites: Secondary | ICD-10-CM

## 2020-04-29 DIAGNOSIS — C8280 Other types of follicular lymphoma, unspecified site: Secondary | ICD-10-CM | POA: Insufficient documentation

## 2020-04-29 DIAGNOSIS — D649 Anemia, unspecified: Secondary | ICD-10-CM | POA: Diagnosis not present

## 2020-04-29 DIAGNOSIS — C8200 Follicular lymphoma grade I, unspecified site: Secondary | ICD-10-CM

## 2020-04-29 LAB — CBC AND DIFFERENTIAL
HCT: 45 (ref 41–53)
Hemoglobin: 15 (ref 13.5–17.5)
Neutrophils Absolute: 3.97
Platelets: 184 (ref 150–399)
WBC: 6.5

## 2020-04-29 LAB — BASIC METABOLIC PANEL
BUN: 21 (ref 4–21)
CO2: 31 — AB (ref 13–22)
Chloride: 102 (ref 99–108)
Creatinine: 1.2 (ref 0.6–1.3)
Potassium: 3.9 (ref 3.4–5.3)
Sodium: 140 (ref 137–147)

## 2020-04-29 LAB — LACTATE DEHYDROGENASE: LDH: 124 U/L (ref 98–192)

## 2020-04-29 LAB — COMPREHENSIVE METABOLIC PANEL
Albumin: 4.4 (ref 3.5–5.0)
Calcium: 9.5 (ref 8.7–10.7)

## 2020-04-29 LAB — HEPATIC FUNCTION PANEL
ALT: 15 (ref 10–40)
AST: 16 (ref 14–40)
Alkaline Phosphatase: 52 (ref 25–125)
Bilirubin, Total: 0.6

## 2020-04-29 LAB — CBC: RBC: 5.14 — AB (ref 3.87–5.11)

## 2020-04-29 NOTE — Telephone Encounter (Signed)
Per 11/8 LOS APPT GIVEN TO PATIENT.

## 2020-05-01 ENCOUNTER — Inpatient Hospital Stay: Payer: PPO

## 2020-05-01 ENCOUNTER — Other Ambulatory Visit: Payer: Self-pay

## 2020-05-01 VITALS — BP 156/73 | HR 80 | Temp 98.7°F | Resp 18 | Ht 69.25 in | Wt 181.2 lb

## 2020-05-01 DIAGNOSIS — C8200 Follicular lymphoma grade I, unspecified site: Secondary | ICD-10-CM

## 2020-05-01 DIAGNOSIS — C8280 Other types of follicular lymphoma, unspecified site: Secondary | ICD-10-CM | POA: Diagnosis not present

## 2020-05-01 DIAGNOSIS — Z95828 Presence of other vascular implants and grafts: Secondary | ICD-10-CM

## 2020-05-01 HISTORY — DX: Presence of other vascular implants and grafts: Z95.828

## 2020-05-01 MED ORDER — HEPARIN SOD (PORK) LOCK FLUSH 100 UNIT/ML IV SOLN
500.0000 [IU] | Freq: Once | INTRAVENOUS | Status: AC | PRN
  Administered 2020-05-01: 500 [IU]
  Filled 2020-05-01: qty 5

## 2020-05-01 MED ORDER — SODIUM CHLORIDE 0.9% FLUSH
10.0000 mL | INTRAVENOUS | Status: DC | PRN
  Administered 2020-05-01: 10 mL
  Filled 2020-05-01: qty 10

## 2020-05-01 NOTE — Progress Notes (Signed)
PT STABLE AT TIME OF DISCHARGE 

## 2020-05-01 NOTE — Addendum Note (Signed)
Addended by: Juanetta Beets on: 05/01/2020 12:43 PM   Modules accepted: Orders

## 2020-05-01 NOTE — Addendum Note (Signed)
Addended by: Tonita Phoenix D on: 05/01/2020 01:11 PM   Modules accepted: Orders

## 2020-05-23 DIAGNOSIS — N529 Male erectile dysfunction, unspecified: Secondary | ICD-10-CM | POA: Diagnosis not present

## 2020-05-23 DIAGNOSIS — G629 Polyneuropathy, unspecified: Secondary | ICD-10-CM | POA: Diagnosis not present

## 2020-05-23 DIAGNOSIS — R739 Hyperglycemia, unspecified: Secondary | ICD-10-CM | POA: Diagnosis not present

## 2020-05-23 DIAGNOSIS — Z0000 Encounter for general adult medical examination without abnormal findings: Secondary | ICD-10-CM | POA: Diagnosis not present

## 2020-05-23 DIAGNOSIS — I428 Other cardiomyopathies: Secondary | ICD-10-CM | POA: Diagnosis not present

## 2020-05-23 DIAGNOSIS — L570 Actinic keratosis: Secondary | ICD-10-CM | POA: Diagnosis not present

## 2020-05-23 DIAGNOSIS — Z1331 Encounter for screening for depression: Secondary | ICD-10-CM | POA: Diagnosis not present

## 2020-05-23 DIAGNOSIS — Z8579 Personal history of other malignant neoplasms of lymphoid, hematopoietic and related tissues: Secondary | ICD-10-CM | POA: Diagnosis not present

## 2020-05-23 DIAGNOSIS — Z6826 Body mass index (BMI) 26.0-26.9, adult: Secondary | ICD-10-CM | POA: Diagnosis not present

## 2020-05-23 DIAGNOSIS — R0789 Other chest pain: Secondary | ICD-10-CM | POA: Diagnosis not present

## 2020-05-29 DIAGNOSIS — H04123 Dry eye syndrome of bilateral lacrimal glands: Secondary | ICD-10-CM | POA: Diagnosis not present

## 2020-05-29 DIAGNOSIS — Z961 Presence of intraocular lens: Secondary | ICD-10-CM | POA: Diagnosis not present

## 2020-05-29 DIAGNOSIS — H25811 Combined forms of age-related cataract, right eye: Secondary | ICD-10-CM | POA: Diagnosis not present

## 2020-05-29 DIAGNOSIS — H2129 Other iris atrophy: Secondary | ICD-10-CM | POA: Diagnosis not present

## 2020-09-03 DIAGNOSIS — L814 Other melanin hyperpigmentation: Secondary | ICD-10-CM | POA: Diagnosis not present

## 2020-09-03 DIAGNOSIS — L821 Other seborrheic keratosis: Secondary | ICD-10-CM | POA: Diagnosis not present

## 2020-09-03 DIAGNOSIS — L570 Actinic keratosis: Secondary | ICD-10-CM | POA: Diagnosis not present

## 2020-10-22 DIAGNOSIS — Z01818 Encounter for other preprocedural examination: Secondary | ICD-10-CM | POA: Diagnosis not present

## 2020-10-22 DIAGNOSIS — H40033 Anatomical narrow angle, bilateral: Secondary | ICD-10-CM | POA: Diagnosis not present

## 2020-10-22 DIAGNOSIS — H25811 Combined forms of age-related cataract, right eye: Secondary | ICD-10-CM | POA: Diagnosis not present

## 2020-10-22 DIAGNOSIS — H2511 Age-related nuclear cataract, right eye: Secondary | ICD-10-CM | POA: Diagnosis not present

## 2020-10-23 ENCOUNTER — Ambulatory Visit (HOSPITAL_COMMUNITY)
Admission: RE | Admit: 2020-10-23 | Discharge: 2020-10-23 | Disposition: A | Discharge status: Home / Self Care | Payer: PPO | Source: Ambulatory Visit | Attending: Oncology | Admitting: Oncology

## 2020-10-23 ENCOUNTER — Other Ambulatory Visit: Payer: Self-pay

## 2020-10-23 DIAGNOSIS — C8208 Follicular lymphoma grade I, lymph nodes of multiple sites: Secondary | ICD-10-CM | POA: Insufficient documentation

## 2020-10-23 DIAGNOSIS — C969 Malignant neoplasm of lymphoid, hematopoietic and related tissue, unspecified: Secondary | ICD-10-CM | POA: Diagnosis not present

## 2020-10-23 DIAGNOSIS — I700 Atherosclerosis of aorta: Secondary | ICD-10-CM | POA: Diagnosis not present

## 2020-10-23 LAB — GLUCOSE, CAPILLARY: Glucose-Capillary: 98 mg/dL (ref 70–99)

## 2020-10-23 MED ORDER — FLUDEOXYGLUCOSE F - 18 (FDG) INJECTION
9.4000 | Freq: Once | INTRAVENOUS | Status: AC | PRN
  Administered 2020-10-23: 9.5 via INTRAVENOUS

## 2020-10-24 ENCOUNTER — Telehealth: Payer: Self-pay | Admitting: Oncology

## 2020-10-24 ENCOUNTER — Other Ambulatory Visit: Payer: PPO

## 2020-10-24 NOTE — Telephone Encounter (Signed)
Patient called to verify next week's Appt. He was only scheduled for 5/12 Follow Up - Added VAD Labs/Flush

## 2020-10-29 NOTE — Progress Notes (Signed)
[facility]  [address] [place],  [place]  [address] [phone]  Clinic Day:  10/31/2020  Referring physician: [name], MD  This document serves as a record of services personally performed by Dequincy Macarthur Critchley, MD. It was created on their behalf by [facility], a trained medical scribe. The creation of this record is based on the scribe's personal observations and the provider's statements to them.  HISTORY OF PRESENT ILLNESS:  The patient is an 80 y.o. male with a long history of follicular lymphoma and diffuse large B cell lymphoma.  His diffuse large B-cell lymphoma was initially diagnosed in May 2000 per biopsy of a mass in his left thyroid.  As CT scans showed evidence of disease above and below his diaphragm, he was given 6 cycles of CHOP chemotherapy, followed by weekly Rituxan for 8 treatments.  His disease did well up until August 2008 when CT and PET scans showed evidence of disease recurrence.  At that time, a repeat biopsy showed follicular lymphoma, which led to him undergoing 6 cycles of Rituxan-CVP.  The plan was to continue maintenance Rituxan for 2 years, but the patient only received 1 cycle of maintenance Rituxan before discontinuing it in early 2009.  He has not required any systemic therapy since then.  The patient has had biopsies and resections of a fleshy skin nodule behind his right ear, whose pathology revealed follicular lymphoma, but with focal areas within it suggesting potential transformation into a more aggressive subtype.  As exams and scans have shown no evidence of diffuse disease, his disease has been followed conservatively.  He comes in today for routine follow-up, as well as to review his PET scan, which was done to reassess the status of his follicular lymphoma.  Since his last visit, the patient has been doing well.  He continues to deny having any B symptoms, bulky lymphadenopathy, or ominous skin changes  which concern him for overt disease progression.    PHYSICAL EXAM:  Blood pressure 139/70, pulse 72, temperature 98.6 F (37 C), resp. rate 16, height 5' 9.25" (1.759 m), weight 195 lb 4.8 oz (88.6 kg), SpO2 98 %. Wt Readings from Last 3 Encounters:  10/31/20 195 lb 4.8 oz (88.6 kg)  10/23/20 191 lb (86.6 kg)  05/01/20 181 lb 4 oz (82.2 kg)   Body mass index is 28.63 kg/m. Performance status (ECOG): 1 Physical Exam Constitutional:      Appearance: Normal appearance. He is not ill-appearing.  HENT:     Mouth/Throat:     Mouth: Mucous membranes are moist.     Pharynx: Oropharynx is clear. No oropharyngeal exudate or posterior oropharyngeal erythema.  Cardiovascular:     Rate and Rhythm: Normal rate and regular rhythm.     Heart sounds: No murmur heard. No friction rub. No gallop.   Pulmonary:     Effort: Pulmonary effort is normal. No respiratory distress.     Breath sounds: Normal breath sounds. No wheezing, rhonchi or rales.  Chest:  Breasts:     Right: No axillary adenopathy or supraclavicular adenopathy.     Left: No axillary adenopathy or supraclavicular adenopathy.    Abdominal:     General: Bowel sounds are normal. There is no distension.     Palpations: Abdomen is soft. There is no mass.     Tenderness: There is no abdominal tenderness.  Musculoskeletal:        General: No swelling.  Right lower leg: No edema.     Left lower leg: No edema.  Lymphadenopathy:     Cervical: No cervical adenopathy.     Upper Body:     Right upper body: No supraclavicular or axillary adenopathy.     Left upper body: No supraclavicular or axillary adenopathy.     Lower Body: No right inguinal adenopathy. No left inguinal adenopathy.  Skin:    General: Skin is warm.     Coloration: Skin is not jaundiced.     Findings: No lesion or rash.  Neurological:     General: No focal deficit present.     Mental Status: He is alert and oriented to person, place, and time. Mental status is  at baseline.     Cranial Nerves: Cranial nerves are intact.  Psychiatric:        Mood and Affect: Mood normal.        Behavior: Behavior normal.        Thought Content: Thought content normal.    SCANS: EXAM: 10/23/2020 NUCLEAR MEDICINE PET SKULL BASE TO THIGH  FINDINGS: Mediastinal blood pool activity: SUV max 2.77  Liver activity: SUV max 4.27  NECK: No hypermetabolic lymph nodes in the neck.  Incidental CT findings: none  CHEST: Bilateral axillary lymph nodes with mild FDG uptake without change in size compared to previous imaging. These are less than a cm but display mild increased FDG uptake though less than mediastinal blood pool in the LEFT and RIGHT axilla. There is however a new focus of FDG uptake corresponding to a subcutaneous nodule or small lymph node adjacent to the RIGHT shoulder on image 66/4 measuring 8 mm short axis with a maximum SUV of 3.6, this was not present previously.  No mediastinal lymph nodes with significant FDG uptake. No suspicious pulmonary nodules. Stable tiny nodule in the RIGHT upper lobe on image 61/4 at 4 mm.  Incidental CT findings: Calcified atheromatous plaque of the thoracic aorta. The Port-A-Cath terminates in the caval to atrial junction. Heart size normal without pericardial effusion. Normal caliber central pulmonary vessels. Limited assessment of cardiovascular structures given lack of intravenous contrast. Esophagus grossly normal. The lungs are clear.  ABDOMEN/PELVIS: No solid organ uptake.  LEFT periaortic lymph nodes measuring 14 as compared to 16 mm short axis (image 137/4) maximum SUV 2.8 as compared to 3.7.  Portacaval lymph node (image 124/412 mm short axis with a maximum SUV of 3.5 as compared to 3.  Similar appearance of lower abdominal and additional retroperitoneal nodal tissue, obscured by streak artifact from adjacent hardware.  RIGHT external iliac lymph node diminished in size  previously greater than 2 cm short axis now at 1.3 cm (image 172/4) SUV of 2.4 as compared to 6.4.  RIGHT pelvic sidewall lymph node diminished in size (image 175/4) cm previously 1.7 cm but with intense FDG uptake with maximum SUV of 14. This previously showed a maximum SUV of 5.0  LEFT pelvic nodal tissue with similar appearance compared to previous imaging, LEFT pelvic sidewall/external iliac lymph node 11 mm (image 177/4) maximum SUV of 4.2, previously showing a maximum SUV of 6.0.  Incidental CT findings: Liver, gallbladder, pancreas, spleen and adrenal glands are unremarkable. Cyst arises from the upper pole the RIGHT kidney. No hydronephrosis. Urinary bladder collapsed, grossly normal. No acute gastrointestinal process. Appearance of the gastrointestinal tract unremarkable with normal appendix.  Stranding in the pelvis likely related to post treatment changes with similar appearance, indistinct soft tissue about the iliac bifurcation.  Pelvic lymph nodes as described above.  SKELETON: No focal hypermetabolic activity to suggest skeletal metastasis.  Incidental CT findings: RIGHT hip arthroplasty and spinal degenerative changes. Also with signs of spinal fusion in the lower lumbar spine as before.  IMPRESSION: On balance worsening of disease with markedly hypermetabolic area in particular along the RIGHT pelvic sidewall with a maximum SUV of 14, compatible with Deauville 5 disease  New area of moderate FDG uptake in the soft tissues about the RIGHT shoulder as well with a maximum SUV of 3.6.  Otherwise stable to slight interval improvement of existing disease.  Aortic Atherosclerosis (ICD10-I70.0).  LABS:   Hematology 10/31/2020  WBC 6.1  Absolute Neutrophils 4.09  RBC 4.86  Hemoglobin 14.2  Hematocrit 41.9 (L)  Platelets 179   Chemistry 10/31/2020  Sodium 137  Potassium 3.8  BUN 22 (H)  Creatinine 1.0  Glucose 145 (H)  Calcium 9.0  Albumin  4.3  AST 19  ALT 15  Alkaline Phosphatase 54  Total Bilirubin 0.7    Ref. Range 10/31/2020 13:24  LDH Latest Ref Range: 98 - 192 U/L 132    ASSESSMENT & PLAN:   Assessment/Plan:  An 80 y.o. male with a history of both diffuse large B-cell lymphoma and low-grade follicular lymphoma.   In clinic today, I went over his PET scan images with him, for which he could see that subtle lymphadenopathy persists within his retroperitoneal and pelvic regions.  The only focus of significant hypermetabolic activity is within his right pelvic region. However, this node is relatively small; furthermore, he has no local symptoms in this area.  His LDH level remains normal.    Overall, the patient is willing to continue following his lymphoma conservatively.  I will see him back in 6 months for repeat clinical assessment.  The patient understands all the plans discussed today and is in agreement with them.    I, Rita Ohara, am acting as scribe for Marice Potter, MD    I have reviewed this report as typed by the medical scribe, and it is complete and accurate.  Dequincy Macarthur Critchley, MD

## 2020-10-31 ENCOUNTER — Inpatient Hospital Stay: Payer: PPO | Attending: Oncology | Admitting: Oncology

## 2020-10-31 ENCOUNTER — Other Ambulatory Visit: Payer: Self-pay

## 2020-10-31 ENCOUNTER — Encounter: Payer: Self-pay | Admitting: Oncology

## 2020-10-31 ENCOUNTER — Other Ambulatory Visit: Payer: Self-pay | Admitting: Oncology

## 2020-10-31 ENCOUNTER — Telehealth: Payer: Self-pay | Admitting: Oncology

## 2020-10-31 ENCOUNTER — Inpatient Hospital Stay: Payer: PPO

## 2020-10-31 VITALS — BP 139/70 | HR 72 | Temp 98.6°F | Resp 16 | Ht 69.25 in | Wt 195.3 lb

## 2020-10-31 DIAGNOSIS — D649 Anemia, unspecified: Secondary | ICD-10-CM | POA: Diagnosis not present

## 2020-10-31 DIAGNOSIS — C8208 Follicular lymphoma grade I, lymph nodes of multiple sites: Secondary | ICD-10-CM

## 2020-10-31 DIAGNOSIS — C8298 Follicular lymphoma, unspecified, lymph nodes of multiple sites: Secondary | ICD-10-CM

## 2020-10-31 DIAGNOSIS — C8290 Follicular lymphoma, unspecified, unspecified site: Secondary | ICD-10-CM | POA: Diagnosis not present

## 2020-10-31 DIAGNOSIS — C8280 Other types of follicular lymphoma, unspecified site: Secondary | ICD-10-CM | POA: Diagnosis not present

## 2020-10-31 LAB — BASIC METABOLIC PANEL
BUN: 22 — AB (ref 4–21)
CO2: 29 — AB (ref 13–22)
Chloride: 103 (ref 99–108)
Creatinine: 1 (ref 0.6–1.3)
Glucose: 145
Potassium: 3.8 (ref 3.4–5.3)
Sodium: 137 (ref 137–147)

## 2020-10-31 LAB — CBC: RBC: 4.86 (ref 3.87–5.11)

## 2020-10-31 LAB — COMPREHENSIVE METABOLIC PANEL
Albumin: 4.3 (ref 3.5–5.0)
Calcium: 9 (ref 8.7–10.7)

## 2020-10-31 LAB — HEPATIC FUNCTION PANEL
ALT: 15 (ref 10–40)
AST: 19 (ref 14–40)
Alkaline Phosphatase: 54 (ref 25–125)
Bilirubin, Total: 0.7

## 2020-10-31 LAB — CBC AND DIFFERENTIAL
HCT: 42 (ref 41–53)
Hemoglobin: 14.2 (ref 13.5–17.5)
Neutrophils Absolute: 4.09
Platelets: 179 (ref 150–399)
WBC: 6.1

## 2020-10-31 LAB — LACTATE DEHYDROGENASE: LDH: 132 U/L (ref 98–192)

## 2020-10-31 NOTE — Telephone Encounter (Signed)
Per 5/12 los next appt scheduled and given to patient 

## 2020-10-31 NOTE — Progress Notes (Signed)
Patient on plan of care prior to pathways. 

## 2020-10-31 NOTE — Progress Notes (Signed)
START ON PATHWAY REGIMEN - Breast     A cycle is every 21 days:     Fam-trastuzumab [id number]-nxki   **Always confirm dose/schedule in your pharmacy ordering system**  Patient Characteristics: Distant Metastases or Locoregional Recurrent Disease - Unresected or Locally Advanced Unresectable Disease Progressing after Neoadjuvant and Local Therapies, HER2 Positive, ER Negative/Unknown, Chemotherapy, Second Line Therapeutic Status: Distant Metastases ER Status: Negative (-) HER2 Status: Positive (+) PR Status: Negative (-) Line of Therapy: Second Line Intent of Therapy: Non-Curative / Palliative Intent, Discussed with Patient

## 2020-11-11 ENCOUNTER — Telehealth: Payer: Self-pay

## 2020-11-11 NOTE — Telephone Encounter (Signed)
Pt's LDH was 132, which is in normal range. Pt notified.

## 2020-11-12 DIAGNOSIS — Z8572 Personal history of non-Hodgkin lymphomas: Secondary | ICD-10-CM | POA: Diagnosis not present

## 2020-11-12 DIAGNOSIS — H259 Unspecified age-related cataract: Secondary | ICD-10-CM | POA: Diagnosis not present

## 2020-11-12 DIAGNOSIS — H2511 Age-related nuclear cataract, right eye: Secondary | ICD-10-CM | POA: Diagnosis not present

## 2020-11-12 DIAGNOSIS — H25811 Combined forms of age-related cataract, right eye: Secondary | ICD-10-CM | POA: Diagnosis not present

## 2020-11-12 DIAGNOSIS — E7800 Pure hypercholesterolemia, unspecified: Secondary | ICD-10-CM | POA: Diagnosis not present

## 2020-11-12 DIAGNOSIS — I10 Essential (primary) hypertension: Secondary | ICD-10-CM | POA: Diagnosis not present

## 2021-03-21 DIAGNOSIS — L570 Actinic keratosis: Secondary | ICD-10-CM | POA: Diagnosis not present

## 2021-03-21 DIAGNOSIS — L821 Other seborrheic keratosis: Secondary | ICD-10-CM | POA: Diagnosis not present

## 2021-03-21 DIAGNOSIS — D225 Melanocytic nevi of trunk: Secondary | ICD-10-CM | POA: Diagnosis not present

## 2021-03-25 ENCOUNTER — Encounter: Payer: Self-pay | Admitting: Oncology

## 2021-04-01 DIAGNOSIS — Z23 Encounter for immunization: Secondary | ICD-10-CM | POA: Diagnosis not present

## 2021-04-21 DIAGNOSIS — E7800 Pure hypercholesterolemia, unspecified: Secondary | ICD-10-CM | POA: Diagnosis not present

## 2021-04-21 DIAGNOSIS — R739 Hyperglycemia, unspecified: Secondary | ICD-10-CM | POA: Diagnosis not present

## 2021-04-21 DIAGNOSIS — I10 Essential (primary) hypertension: Secondary | ICD-10-CM | POA: Diagnosis not present

## 2021-04-28 NOTE — Progress Notes (Signed)
[place] [facility]  [address] [place],  [place]  [address] [phone]  Clinic Day:  05/05/2021  Referring physician: [name], MD  This document serves as a record of services personally performed by Kinser Fellman Macarthur Critchley, MD. It was created on their behalf by [facility], a trained medical scribe. The creation of this record is based on the scribe's personal observations and the provider's statements to them.  HISTORY OF PRESENT ILLNESS:  The patient is an 80 y.o. male with a long history of follicular lymphoma and diffuse large B cell lymphoma.  He comes in today for routine follow-up.  At his last visit, a PET scan showed disease in his right pelvis and right shoulder reason.  However, as he has been asymptomatic and is not particularly enamored with receiving another form of chemotherapy, his disease has been followed conservatively.  Since his last visit, the patient has been doing well.  He has noticed having night sweats on his upper torso to where he occasionally has to change his night shirt.  He denies having to change his bed sheets.  He denies having any B symptoms, lymphadenopathy or other findings which concern him for overt signs of disease progression.    His diffuse large B-cell lymphoma was initially diagnosed in May 2000 per biopsy of a mass in his left thyroid.  As CT scans showed evidence of disease above and below his diaphragm, he was given 6 cycles of CHOP chemotherapy, followed by weekly Rituxan for 8 treatments.  His disease did well up until August 2008 when CT and PET scans showed evidence of disease recurrence.  At that time, a repeat biopsy showed follicular lymphoma, which led to him undergoing 6 cycles of Rituxan-CVP.  The plan was to continue maintenance Rituxan for 2 years, but the patient only received 1 cycle of maintenance Rituxan before discontinuing it in early 2009.  He has not required any systemic therapy since then.   The patient has had biopsies and resections of a fleshy skin nodule behind his right ear, whose pathology revealed follicular lymphoma, but with focal areas within it suggesting potential transformation into a more aggressive subtype.  However, this area has not flared up to where intervention has been necessary.  PHYSICAL EXAM:  Blood pressure 131/76, pulse 76, temperature 97.6 F (36.4 C), resp. rate 16, height 5' 9.25" (1.759 m), weight 197 lb 9.6 oz (89.6 kg), SpO2 97 %. Wt Readings from Last 3 Encounters:  05/05/21 197 lb 9.6 oz (89.6 kg)  10/31/20 195 lb 4.8 oz (88.6 kg)  10/23/20 191 lb (86.6 kg)   Body mass index is 28.97 kg/m. Performance status (ECOG): 1 Physical Exam Constitutional:      Appearance: Normal appearance. He is not ill-appearing.  HENT:     Mouth/Throat:     Mouth: Mucous membranes are moist.     Pharynx: Oropharynx is clear. No oropharyngeal exudate or posterior oropharyngeal erythema.  Cardiovascular:     Rate and Rhythm: Normal rate and regular rhythm.     Heart sounds: No murmur heard.   No friction rub. No gallop.  Pulmonary:     Effort: Pulmonary effort is normal. No respiratory distress.     Breath sounds: Normal breath sounds. No wheezing, rhonchi or rales.  Abdominal:     General: Bowel sounds are normal. There is no distension.     Palpations: Abdomen is soft. There is no mass.     Tenderness: There  is no abdominal tenderness.  Musculoskeletal:        General: No swelling.     Right lower leg: No edema.     Left lower leg: No edema.  Lymphadenopathy:     Cervical: No cervical adenopathy.     Upper Body:     Right upper body: No supraclavicular or axillary adenopathy.     Left upper body: No supraclavicular or axillary adenopathy.     Lower Body: No right inguinal adenopathy. No left inguinal adenopathy.  Skin:    General: Skin is warm.     Coloration: Skin is not jaundiced.     Findings: No lesion or rash.  Neurological:     General: No  focal deficit present.     Mental Status: He is alert and oriented to person, place, and time. Mental status is at baseline.  Psychiatric:        Mood and Affect: Mood normal.        Behavior: Behavior normal.        Thought Content: Thought content normal.    LABS:     Ref. Range 10/31/2020 13:24 05/02/2021 13:24  LDH Latest Ref Range: 98 - 192 U/L 132 152    ASSESSMENT & PLAN:  Assessment/Plan:  An 80 y.o. male with a history of both diffuse large B-cell lymphoma and low-grade follicular lymphoma. His blood counts and LDH level remain normal.  Overall, the patient is willing to continue following his lymphoma conservatively.  Per his request, I will see him back in 4 months for repeat clinical assessment.  The patient understands all the plans discussed today and is in agreement with them.    I, Rita Ohara, am acting as scribe for Marice Potter, MD    I have reviewed this report as typed by the medical scribe, and it is complete and accurate.  Lillianne Eick Macarthur Critchley, MD

## 2021-05-02 ENCOUNTER — Inpatient Hospital Stay: Payer: PPO | Attending: Oncology

## 2021-05-02 DIAGNOSIS — Z79899 Other long term (current) drug therapy: Secondary | ICD-10-CM | POA: Diagnosis not present

## 2021-05-02 DIAGNOSIS — C8290 Follicular lymphoma, unspecified, unspecified site: Secondary | ICD-10-CM | POA: Insufficient documentation

## 2021-05-02 DIAGNOSIS — C8330 Diffuse large B-cell lymphoma, unspecified site: Secondary | ICD-10-CM | POA: Diagnosis not present

## 2021-05-02 DIAGNOSIS — D649 Anemia, unspecified: Secondary | ICD-10-CM | POA: Diagnosis not present

## 2021-05-02 DIAGNOSIS — C8208 Follicular lymphoma grade I, lymph nodes of multiple sites: Secondary | ICD-10-CM

## 2021-05-02 LAB — LACTATE DEHYDROGENASE: LDH: 152 U/L (ref 98–192)

## 2021-05-05 ENCOUNTER — Inpatient Hospital Stay (INDEPENDENT_AMBULATORY_CARE_PROVIDER_SITE_OTHER): Payer: PPO | Admitting: Oncology

## 2021-05-05 ENCOUNTER — Other Ambulatory Visit: Payer: Self-pay | Admitting: Oncology

## 2021-05-05 ENCOUNTER — Other Ambulatory Visit: Payer: Self-pay

## 2021-05-05 VITALS — BP 131/76 | HR 76 | Temp 97.6°F | Resp 16 | Ht 69.25 in | Wt 197.6 lb

## 2021-05-05 DIAGNOSIS — C8298 Follicular lymphoma, unspecified, lymph nodes of multiple sites: Secondary | ICD-10-CM

## 2021-05-05 DIAGNOSIS — C8228 Follicular lymphoma grade III, unspecified, lymph nodes of multiple sites: Secondary | ICD-10-CM | POA: Diagnosis not present

## 2021-05-06 ENCOUNTER — Inpatient Hospital Stay: Payer: PPO

## 2021-05-06 NOTE — Patient Instructions (Signed)

## 2021-05-06 NOTE — Progress Notes (Signed)
Discharged home.

## 2021-05-07 ENCOUNTER — Encounter: Payer: Self-pay | Admitting: Oncology

## 2021-05-26 DIAGNOSIS — Z8579 Personal history of other malignant neoplasms of lymphoid, hematopoietic and related tissues: Secondary | ICD-10-CM | POA: Diagnosis not present

## 2021-05-26 DIAGNOSIS — Z6829 Body mass index (BMI) 29.0-29.9, adult: Secondary | ICD-10-CM | POA: Diagnosis not present

## 2021-05-26 DIAGNOSIS — Z1331 Encounter for screening for depression: Secondary | ICD-10-CM | POA: Diagnosis not present

## 2021-05-26 DIAGNOSIS — Z0000 Encounter for general adult medical examination without abnormal findings: Secondary | ICD-10-CM | POA: Diagnosis not present

## 2021-05-26 DIAGNOSIS — I428 Other cardiomyopathies: Secondary | ICD-10-CM | POA: Diagnosis not present

## 2021-05-26 DIAGNOSIS — G629 Polyneuropathy, unspecified: Secondary | ICD-10-CM | POA: Diagnosis not present

## 2021-05-26 NOTE — Progress Notes (Signed)
Cardiology Office Note    Date:  05/26/2021   ID:  Sean, Hodges 08/09/1940, MRN [id number]  PCP:  [name], MD  Cardiologist:  Dr. Martinique   No chief complaint on file.   History of Present Illness:  Sean Hodges is a 80 y.o. male with PMH of NICM, HTN, HLD and non-Hodgkin's lymphoma (received CHOP chemo in 2000).  He had a history of low EF following chemotherapy with CHOP in the past.  Stress echo at [facility] in March 2016 showed resting EF 40-45%, normal wall motion, EF improved to 65% with exercise.  Echo in September 2016 showed EF 50-55%.  He had a recurrence of non-Hodgkin's lymphoma in 2008 was treated with a different chemo therapy between 2008 and 2009.    He underwent lumbar laminectomy in April 2019.  He reports 2 episodes of bronchitis in June.  On follow up today he is seen  with his wife.  He is doing very well.   Continuing to monitor his lymphoma at this time. PET scan in May showed progression.   He denies any chest pain or shortness of breath. No edema.  He does note he is more fatigued. Continues to walk daily.   Past Medical History:  Diagnosis Date   Arthritis    knee osteoarthritis   Cancer ([place])    nhl   Hypercholesterolemia    Hypertension    Lymphoma ([place])    Nonischemic cardiomyopathy ([place]) 03/07/2015    Past Surgical History:  Procedure Laterality Date   arthroscopies Bilateral    Left eye surgery  2013   THYROIDECTOMY, PARTIAL Left    vocal cord     polyps removed    Current Medications: Outpatient Medications Prior to Visit  Medication Sig Dispense Refill   calcium & magnesium carbonates (MYLANTA) 993-570 MG per tablet Take 1 tablet by mouth 2 (two) times daily.     Cholecalciferol (VITAMIN D3 PO) Take 500 Units by mouth 2 (two) times daily.     COENZYME Q-10 PO Take 50 mg by mouth 2 (two) times daily.      DOCOSAHEXAENOIC ACID PO Take 1 g by mouth daily.      enalapril (VASOTEC) 10 MG tablet Take  10 mg by mouth daily.  0   famotidine (PEPCID) 40 MG tablet Take 40 mg by mouth at bedtime.     fish oil-omega-3 fatty acids 1000 MG capsule Take 1 g by mouth daily.     glucosamine-chondroitin 500-400 MG tablet Take 1 tablet by mouth 2 (two) times daily.      pravastatin (PRAVACHOL) 80 MG tablet Take 80 mg by mouth daily.   1   saw palmetto 500 MG capsule Take 500 mg by mouth 2 (two) times daily.     vitamin B-12 (CYANOCOBALAMIN) 1000 MCG tablet Take 1,000 mcg by mouth daily.      Facility-Administered Medications Prior to Visit  Medication Dose Route Frequency Provider Last Rate Last Admin   sodium chloride flush (NS) 0.9 % injection 10 mL  10 mL Intracatheter PRN Lewis, Dequincy A, MD   10 mL at 05/01/20 1210   triamcinolone acetonide (KENALOG-40) injection 20 mg  20 mg Other Once Landis Martins, DPM         Allergies:   Gabapentin and Lipitor [atorvastatin]   Social History   Socioeconomic History   Marital status: Married    Spouse name: Not on file   Number of children: 3  Years of education: Not on file   Highest education level: Not on file  Occupational History   Occupation: retired-engineer  Tobacco Use   Smoking status: Former    Types: Cigarettes    Quit date: 01/20/1974    Years since quitting: 47.3   Smokeless tobacco: Former    Quit date: 06/22/1973  Vaping Use   Vaping Use: Never used  Substance and Sexual Activity   Alcohol use: Never   Drug use: Never   Sexual activity: Not on file  Other Topics Concern   Not on file  Social History Narrative   Patient is very active, walks 3 miles a day, former Psychologist, prison and probation services, 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 3 children [place] in [place], 2 in [place]   Social Determinants of Health   Financial Resource Strain: Not on file  Food Insecurity: Not on file  Transportation Needs: Not on file  Physical Activity: Not on file  Stress: Not on file  Social Connections: Not  on file     Family History:  The patient's family history includes Cancer in his brother, brother, brother, brother, and brother.   ROS:   Please see the history of present illness.    ROS All other systems reviewed and are negative.   PHYSICAL EXAM:   VS:  There were no vitals taken for this visit.   GENERAL:  Well appearing overweight WM in NAD HEENT:  PERRL, EOMI, sclera are clear. Oropharynx is clear. NECK:  No jugular venous distention, carotid upstroke brisk and symmetric, no bruits, no thyromegaly or adenopathy LUNGS:  Clear to auscultation bilaterally CHEST:  Unremarkable HEART:  RRR,  PMI not displaced or sustained,S1 and S2 within normal limits, no S3, no S4: no clicks, no rubs, no murmurs ABD:  Soft, nontender. BS +, no masses or bruits. No hepatomegaly, no splenomegaly EXT:  2 + pulses throughout, no edema, no cyanosis no clubbing. Skin is dry and scaly. SKIN:  Warm and dry.  No rashes NEURO:  Alert and oriented x 3. Cranial nerves II through XII intact. PSYCH:  Cognitively intact    Wt Readings from Last 3 Encounters:  05/06/21 191 lb 0.6 oz (86.7 kg)  05/05/21 197 lb 9.6 oz (89.6 kg)  10/31/20 195 lb 4.8 oz (88.6 kg)      Studies/Labs Reviewed:   EKG:  EKG is  ordered today. NSR rate 61.  Normal.  Normal. I have personally reviewed and interpreted this study.   Recent Labs: 10/31/2020: ALT 15; BUN 22; Creatinine 1.0; Hemoglobin 14.2; Platelets 179; Potassium 3.8; Sodium 137   Lipid Panel    Component Value Date/Time   CHOL 152 08/30/2014 1028   TRIG 90 08/30/2014 1028   HDL 39 (L) 08/30/2014 1028   CHOLHDL 3.9 08/30/2014 1028   VLDL 18 08/30/2014 1028   LDLCALC 95 08/30/2014 1028   Labs dated 03/22/17: cholesterol 171, triglycerides 148, HDL 35, LDL 106. Creatinine 1.09. Otherwise chemistries and CBC normal. Dated 04/18/18: cholesterol 179, triglycerides 194, HDL 37, LDL 103.  Dated 10/27/18: creatinine 1.2 otherwise chemistries and Hgb normal. Dated  05/28/21: cholesterol 166, triglycerides 177, HDL 43, nonHDL 123, creatinine 1.3. CMET and CBC otherwise normal.   Additional studies/ records that were reviewed today include:   Echo 02/28/2015 LV EF: 50% -   55%   Study Conclusions   - Left ventricle: The cavity size was mildly dilated. Systolic   function was normal.  The estimated ejection fraction was in the   range of 50% to 55%. Wall motion was normal; there were no   regional wall motion abnormalities. - Aortic valve: There was mild regurgitation. - Left atrium: The atrium was mildly dilated. - Atrial septum: No defect or patent foramen ovale was identified.   ASSESSMENT:    No diagnosis found.    PLAN:  In order of problems listed above:  NICM: asymptomatic.  Last echocardiogram showed EF 50-55% in 2016.   Non-Hodgkin's lymphoma-  Continue to monitor- followed by Dr Bobby Rumpf in [place].  Hypertension: controlled on current therapy  4.   Hyperlipidemia: satisfactory on pravastatin.  5.    Coronary calcification without symptoms of angina. Continue risk factor modification.     Medication Adjustments/Labs and Tests Ordered: Current medicines are reviewed at length with the patient today.  Concerns regarding medicines are outlined above.  Medication changes, Labs and Tests ordered today are listed in the Patient Instructions below. There are no Patient Instructions on file for this visit.   Signed, Miu Chiong Martinique, MD  05/26/2021 8:52 AM    [facility] [address], [place], [place]  [address] Phone: [phone]; Fax: [phone]

## 2021-05-28 DIAGNOSIS — E7800 Pure hypercholesterolemia, unspecified: Secondary | ICD-10-CM | POA: Diagnosis not present

## 2021-05-28 DIAGNOSIS — Z79899 Other long term (current) drug therapy: Secondary | ICD-10-CM | POA: Diagnosis not present

## 2021-05-28 DIAGNOSIS — N529 Male erectile dysfunction, unspecified: Secondary | ICD-10-CM | POA: Diagnosis not present

## 2021-05-30 ENCOUNTER — Ambulatory Visit: Payer: PPO | Admitting: Cardiology

## 2021-05-30 ENCOUNTER — Other Ambulatory Visit: Payer: Self-pay

## 2021-05-30 ENCOUNTER — Encounter: Payer: Self-pay | Admitting: Cardiology

## 2021-05-30 VITALS — BP 140/80 | HR 61 | Ht 69.0 in | Wt 200.0 lb

## 2021-05-30 DIAGNOSIS — I2510 Atherosclerotic heart disease of native coronary artery without angina pectoris: Secondary | ICD-10-CM | POA: Diagnosis not present

## 2021-05-30 DIAGNOSIS — I428 Other cardiomyopathies: Secondary | ICD-10-CM | POA: Diagnosis not present

## 2021-05-30 DIAGNOSIS — I10 Essential (primary) hypertension: Secondary | ICD-10-CM | POA: Diagnosis not present

## 2021-05-30 DIAGNOSIS — I2584 Coronary atherosclerosis due to calcified coronary lesion: Secondary | ICD-10-CM | POA: Diagnosis not present

## 2021-05-30 DIAGNOSIS — E785 Hyperlipidemia, unspecified: Secondary | ICD-10-CM

## 2021-06-05 DIAGNOSIS — H04123 Dry eye syndrome of bilateral lacrimal glands: Secondary | ICD-10-CM | POA: Diagnosis not present

## 2021-06-18 DIAGNOSIS — Z961 Presence of intraocular lens: Secondary | ICD-10-CM | POA: Diagnosis not present

## 2021-07-31 DIAGNOSIS — H04123 Dry eye syndrome of bilateral lacrimal glands: Secondary | ICD-10-CM | POA: Diagnosis not present

## 2021-08-26 NOTE — Progress Notes (Incomplete)
[place]  [address] [place],  [place]  [address] [phone]  Clinic Day:  09/02/2021  Referring physician: [name], MD  This document serves as a record of services personally performed by Dequincy Macarthur Critchley, MD. It was created on their behalf by [facility], a trained medical scribe. The creation of this record is based on the scribe's personal observations and the provider's statements to them.  HISTORY OF PRESENT ILLNESS:  The patient is an 81 y.o. male with a long history of follicular lymphoma and diffuse large B cell lymphoma.  He comes in today for routine follow-up.  At his last visit, a PET scan showed disease in his right pelvis and right shoulder reason.  However, as he has been asymptomatic and is not particularly enamored with receiving another form of chemotherapy, his disease has been followed conservatively.  Since his last visit, the patient has been doing well.  He has noticed having night sweats on his upper torso to where he occasionally has to change his night shirt.  He denies having to change his bed sheets.  He denies having any B symptoms, lymphadenopathy or other findings which concern him for overt signs of disease progression.    His diffuse large B-cell lymphoma was initially diagnosed in May 2000 per biopsy of a mass in his left thyroid.  As CT scans showed evidence of disease above and below his diaphragm, he was given 6 cycles of CHOP chemotherapy, followed by weekly Rituxan for 8 treatments.  His disease did well up until August 2008 when CT and PET scans showed evidence of disease recurrence.  At that time, a repeat biopsy showed follicular lymphoma, which led to him undergoing 6 cycles of Rituxan-CVP.  The plan was to continue maintenance Rituxan for 2 years, but the patient only received 1 cycle of maintenance Rituxan before discontinuing it in early 2009.  He has not required any systemic therapy since then.   The patient has had biopsies and resections of a fleshy skin nodule behind his right ear, whose pathology revealed follicular lymphoma, but with focal areas within it suggesting potential transformation into a more aggressive subtype.  However, this area has not flared up to where intervention has been necessary.  PHYSICAL EXAM:  There were no vitals taken for this visit. Wt Readings from Last 3 Encounters:  05/30/21 200 lb (90.7 kg)  05/06/21 191 lb 0.6 oz (86.7 kg)  05/05/21 197 lb 9.6 oz (89.6 kg)   There is no height or weight on file to calculate BMI. Performance status (ECOG): 1 Physical Exam Constitutional:      Appearance: Normal appearance. He is not ill-appearing.  HENT:     Mouth/Throat:     Mouth: Mucous membranes are moist.     Pharynx: Oropharynx is clear. No oropharyngeal exudate or posterior oropharyngeal erythema.  Cardiovascular:     Rate and Rhythm: Normal rate and regular rhythm.     Heart sounds: No murmur heard.   No friction rub. No gallop.  Pulmonary:     Effort: Pulmonary effort is normal. No respiratory distress.     Breath sounds: Normal breath sounds. No wheezing, rhonchi or rales.  Abdominal:     General: Bowel sounds are normal. There is no distension.     Palpations: Abdomen is soft. There is no mass.     Tenderness: There is no abdominal tenderness.  Musculoskeletal:        General: No  swelling.     Right lower leg: No edema.     Left lower leg: No edema.  Lymphadenopathy:     Cervical: No cervical adenopathy.     Upper Body:     Right upper body: No supraclavicular or axillary adenopathy.     Left upper body: No supraclavicular or axillary adenopathy.     Lower Body: No right inguinal adenopathy. No left inguinal adenopathy.  Skin:    General: Skin is warm.     Coloration: Skin is not jaundiced.     Findings: No lesion or rash.  Neurological:     General: No focal deficit present.     Mental Status: He is alert and oriented to person,  place, and time. Mental status is at baseline.  Psychiatric:        Mood and Affect: Mood normal.        Behavior: Behavior normal.        Thought Content: Thought content normal.    LABS:     Ref. Range 10/31/2020 13:24 05/02/2021 13:24  LDH Latest Ref Range: 98 - 192 U/L 132 152    ASSESSMENT & PLAN:  Assessment/Plan:  An 81 y.o. male with a history of both diffuse large B-cell lymphoma and low-grade follicular lymphoma. His blood counts and LDH level remain normal.  Overall, the patient is willing to continue following his lymphoma conservatively.  Per his request, I will see him back in 4 months for repeat clinical assessment.  The patient understands all the plans discussed today and is in agreement with them.    I, Rita Ohara, am acting as scribe for Marice Potter, MD    I have reviewed this report as typed by the medical scribe, and it is complete and accurate.  Dequincy Macarthur Critchley, MD

## 2021-08-27 ENCOUNTER — Inpatient Hospital Stay: Payer: PPO | Attending: Oncology

## 2021-08-27 ENCOUNTER — Other Ambulatory Visit: Payer: Self-pay

## 2021-08-27 ENCOUNTER — Encounter: Payer: Self-pay | Admitting: Oncology

## 2021-08-27 VITALS — BP 158/82 | HR 74 | Temp 97.6°F | Resp 18 | Ht 69.0 in | Wt 196.8 lb

## 2021-08-27 DIAGNOSIS — C8291 Follicular lymphoma, unspecified, lymph nodes of head, face, and neck: Secondary | ICD-10-CM | POA: Diagnosis not present

## 2021-08-27 DIAGNOSIS — Z95828 Presence of other vascular implants and grafts: Secondary | ICD-10-CM

## 2021-08-27 DIAGNOSIS — C8290 Follicular lymphoma, unspecified, unspecified site: Secondary | ICD-10-CM | POA: Diagnosis not present

## 2021-08-27 DIAGNOSIS — C8298 Follicular lymphoma, unspecified, lymph nodes of multiple sites: Secondary | ICD-10-CM

## 2021-08-27 DIAGNOSIS — D649 Anemia, unspecified: Secondary | ICD-10-CM | POA: Diagnosis not present

## 2021-08-27 LAB — LACTATE DEHYDROGENASE: LDH: 186 U/L (ref 98–192)

## 2021-08-27 MED ORDER — SODIUM CHLORIDE 0.9% FLUSH
10.0000 mL | INTRAVENOUS | Status: DC | PRN
  Administered 2021-08-27: 10 mL

## 2021-08-27 MED ORDER — HEPARIN SOD (PORK) LOCK FLUSH 100 UNIT/ML IV SOLN
500.0000 [IU] | Freq: Once | INTRAVENOUS | Status: AC | PRN
  Administered 2021-08-27: 500 [IU]

## 2021-08-28 DIAGNOSIS — D485 Neoplasm of uncertain behavior of skin: Secondary | ICD-10-CM | POA: Diagnosis not present

## 2021-08-28 DIAGNOSIS — H019 Unspecified inflammation of eyelid: Secondary | ICD-10-CM | POA: Diagnosis not present

## 2021-09-02 ENCOUNTER — Other Ambulatory Visit: Payer: Self-pay | Admitting: Oncology

## 2021-09-02 ENCOUNTER — Other Ambulatory Visit: Payer: Self-pay

## 2021-09-02 ENCOUNTER — Inpatient Hospital Stay (INDEPENDENT_AMBULATORY_CARE_PROVIDER_SITE_OTHER): Payer: PPO | Admitting: Oncology

## 2021-09-02 VITALS — BP 158/90 | HR 85 | Temp 98.2°F | Resp 16 | Ht 69.0 in | Wt 199.9 lb

## 2021-09-02 DIAGNOSIS — C8219 Follicular lymphoma grade II, extranodal and solid organ sites: Secondary | ICD-10-CM | POA: Diagnosis not present

## 2021-09-02 DIAGNOSIS — C8239 Follicular lymphoma grade IIIa, extranodal and solid organ sites: Secondary | ICD-10-CM

## 2021-09-02 NOTE — Progress Notes (Signed)
Pt has recently been seen by Dr. Delia Chimes with [facility] in [place], [place].  Right eye redness and lid swelling.  Two biopsies were taken.  Dr. Bobby Rumpf spoke with Dr. Hollice Espy in clinic today.  Pt informed. ?

## 2021-09-19 DIAGNOSIS — L578 Other skin changes due to chronic exposure to nonionizing radiation: Secondary | ICD-10-CM | POA: Diagnosis not present

## 2021-09-19 DIAGNOSIS — L570 Actinic keratosis: Secondary | ICD-10-CM | POA: Diagnosis not present

## 2021-09-19 DIAGNOSIS — L821 Other seborrheic keratosis: Secondary | ICD-10-CM | POA: Diagnosis not present

## 2021-09-19 DIAGNOSIS — D225 Melanocytic nevi of trunk: Secondary | ICD-10-CM | POA: Diagnosis not present

## 2021-09-22 ENCOUNTER — Ambulatory Visit (HOSPITAL_COMMUNITY)
Admission: RE | Admit: 2021-09-22 | Discharge: 2021-09-22 | Disposition: A | Discharge status: Home / Self Care | Payer: PPO | Source: Ambulatory Visit | Attending: Oncology | Admitting: Oncology

## 2021-09-22 DIAGNOSIS — R590 Localized enlarged lymph nodes: Secondary | ICD-10-CM | POA: Diagnosis not present

## 2021-09-22 DIAGNOSIS — C8239 Follicular lymphoma grade IIIa, extranodal and solid organ sites: Secondary | ICD-10-CM | POA: Diagnosis not present

## 2021-09-22 DIAGNOSIS — I700 Atherosclerosis of aorta: Secondary | ICD-10-CM | POA: Diagnosis not present

## 2021-09-22 DIAGNOSIS — C8220 Follicular lymphoma grade III, unspecified, unspecified site: Secondary | ICD-10-CM | POA: Diagnosis not present

## 2021-09-22 DIAGNOSIS — I2510 Atherosclerotic heart disease of native coronary artery without angina pectoris: Secondary | ICD-10-CM | POA: Diagnosis not present

## 2021-09-22 LAB — GLUCOSE, CAPILLARY: Glucose-Capillary: 116 mg/dL — ABNORMAL HIGH (ref 70–99)

## 2021-09-22 MED ORDER — FLUDEOXYGLUCOSE F - 18 (FDG) INJECTION
9.9000 | Freq: Once | INTRAVENOUS | Status: AC | PRN
  Administered 2021-09-22: 9.95 via INTRAVENOUS

## 2021-09-22 NOTE — Progress Notes (Signed)
?[facility]  ?[address] ?[place],    [address] ?[phone] ? ?Clinic Day:  09/23/2021 ? ?Referring physician: [name], MD ? ?HISTORY OF PRESENT ILLNESS:  ?The patient is an 81 y.o. male with recurrent low grade follicular lymphoma.  There is also history of diffuse large B cell lymphoma. The patient developed a new focus of biopsy-proven low-grade follicular lymphoma involving his right eyelid.  Based upon these findings, the patient underwent a PET scan yesterday to determine if he has other areas of disseminated lymphoma.  The patient is becoming increasingly concerned due to the increased irritation of his right eyelid.  It is beginning to impact the visual acuity with this eye.  As his left eye is already weak, he is very concerned about how his vision may become impacted over these next few months.  As it pertains to his lymphoma, he denies having any obvious B symptoms or bulky lymphadenopathy. ? ?With respect to his lymphoma history, his diffuse large B-cell lymphoma was initially diagnosed in May 2000 per biopsy of a mass in his left thyroid.  As CT scans showed evidence of disease above and below his diaphragm, he was given 6 cycles of CHOP chemotherapy, followed by weekly Rituxan for 8 treatments.  His disease did well up until August 2008 when CT and PET scans showed evidence of disease recurrence.  At that time, a repeat biopsy showed follicular lymphoma, which led to him undergoing 6 cycles of Rituxan-CVP.  The plan was to continue maintenance Rituxan for 2 years, but the patient only received 1 cycle of maintenance Rituxan before discontinuing it in early 2009.  He has not required any systemic therapy since then.  The patient has had biopsies and resections of a fleshy skin nodule behind his right ear, whose pathology revealed follicular lymphoma, but with focal areas within it suggesting potential transformation into a more aggressive  subtype.  However, this area never flared up to where intervention has been necessary.  He recently had problems with his right eyelid, for which a biopsy of the area confirmed the presence of recurrent low-grade follicular lymphoma. ? ?PHYSICAL EXAM:  ?Blood pressure (!) 182/79, pulse 65, temperature 98.6 ?F (37 ?C), resp. rate 16, height '5\' 9"'$  (1.753 m), weight 199 lb 11.2 oz (90.6 kg), SpO2 95 %. ?Wt Readings from Last 3 Encounters:  ?09/23/21 199 lb 11.2 oz (90.6 kg)  ?09/02/21 199 lb 14.4 oz (90.7 kg)  ?08/27/21 196 lb 12.8 oz (89.3 kg)  ? ?Body mass index is 29.49 kg/m?Marland Kitchen ?Performance status (ECOG): 1 ?Physical Exam ?Constitutional:   ?   Appearance: Normal appearance. He is not ill-appearing.  ?HENT:  ?   Mouth/Throat:  ?   Mouth: Mucous membranes are moist.  ?   Pharynx: Oropharynx is clear. No oropharyngeal exudate or posterior oropharyngeal erythema.  ?Eyes:  ?   Comments: Thickened lower right eyelid secondary to lymphomatous involvement  ?Cardiovascular:  ?   Rate and Rhythm: Normal rate and regular rhythm.  ?   Heart sounds: No murmur heard. ?  No friction rub. No gallop.  ?Pulmonary:  ?   Effort: Pulmonary effort is normal. No respiratory distress.  ?   Breath sounds: Normal breath sounds. No wheezing, rhonchi or rales.  ?Abdominal:  ?   General: Bowel sounds are normal. There is no distension.  ?   Palpations: Abdomen is soft. There is no mass.  ?   Tenderness: There is no abdominal  tenderness.  ?Musculoskeletal:     ?   General: No swelling.  ?   Right lower leg: No edema.  ?   Left lower leg: No edema.  ?Lymphadenopathy:  ?   Cervical: No cervical adenopathy.  ?   Upper Body:  ?   Right upper body: No supraclavicular or axillary adenopathy.  ?   Left upper body: No supraclavicular or axillary adenopathy.  ?   Lower Body: No right inguinal adenopathy. No left inguinal adenopathy.  ?Skin: ?   General: Skin is warm.  ?   Coloration: Skin is not jaundiced.  ?   Findings: No lesion or rash.   ?Neurological:  ?   General: No focal deficit present.  ?   Mental Status: He is alert and oriented to person, place, and time. Mental status is at baseline.  ?Psychiatric:     ?   Mood and Affect: Mood normal.     ?   Behavior: Behavior normal.     ?   Thought Content: Thought content normal.  ? ?SCANS: His PET scan done yesterday revealed the following: ?FINDINGS: ?Mediastinal blood pool activity: SUV max 2.8 ? ?Liver activity: SUV max 3.8 ? ?NECK: ? ?No hypermetabolic cervical lymph nodes are identified.Activity ?associated with the muscles of phonation and the lymphoid tissue of ?Waldeyer's ring is within physiologic limits. There is increased ?asymmetric activity anteriorly in the right orbit without clear CT ?correlate, probably physiologic or inflammatory. There is a small ?focus of hypermetabolic activity along the peripheral aspect of the ?right parotid gland (SUV max 4.5), likely an incidental parotid ?lesion, although potentially an overlying superficial lymph node. ? ?Incidental CT findings: none ? ?CHEST: ? ?Small mildly hypermetabolic axillary lymph nodes bilaterally are ?unchanged with metabolic activity approaching blood pool intensity. ?No hypermetabolic mediastinal or hilar lymph nodes are seen. The ?previously demonstrated small hypermetabolic superficial nodule ?posterolateral to the right shoulder has nearly completely resolved. ?No hypermetabolic pulmonary activity or suspicious nodularity. ? ?Incidental CT findings: Right subclavian Port-A-Cath extends to the ?upper SVC level. Atherosclerosis of the aorta, great vessels and ?coronary arteries. ? ?ABDOMEN/PELVIS: ? ?There is no hypermetabolic activity within the liver, adrenal ?glands, spleen or pancreas. Interval increased multifocal ?retroperitoneal and pelvic hypermetabolic lymphadenopathy. Within ?the confluent retroperitoneal soft tissue surrounding the aorta and ?IVC, there is focal retrocaval hypermetabolic activity at the level ?of the  lower pole of the kidneys demonstrating an SUV max of 13.0. ?There is intense hypermetabolic activity (SUV max 45.1) within an ?enlarging right pelvic sidewall node which now measures 5.6 x 3.2 cm ?on image 178/4. Previously, this node had a short axis dimension of ?1.0 cm and an SUV max of 14.3. There is increased metabolic activity ?within a right inguinal node (SUV max 8.1. ? ?Incidental CT findings: No evidence of bowel or ureteral ?obstruction. Aortic and branch vessel atherosclerosis is noted. ?There is a stable simple appearing cyst in the mid right kidney for ?which no follow-up is necessary. ? ?SKELETON: ? ?There is no hypermetabolic activity to suggest osseous metastatic ?disease. ? ?Incidental CT findings: Postsurgical changes in the lower lumbar ?spine. ? ?IMPRESSION: ?1. Definite progression of hypermetabolic retroperitoneal, pelvic ?and right inguinal adenopathy consistent with progressive lymphoma ?(Deauville 5). ?2. Equivocal stable involvement of small lymph nodes in the ?superficial portion of the right parotid gland and both axilla. ?3. No evidence of solid organ or marrow involvement. ?4. Aortic Atherosclerosis (ICD10-I70.0). ? ?ASSESSMENT & PLAN:  ?Assessment/Plan:  An 81 y.o. male with biopsy-proven recurrence  of low-grade follicular lymphoma.  He also has a history of diffuse large B-cell lymphoma.   In clinic today, I went over his PET scan images with him, for which he could see his disease is the most prominent within his retroperitoneal and pelvic regions.  There is 1 nodal station in his right pelvic area that is extremely hypermetabolic, with an SUV of 75.1.  The patient understands that I am concerned this area may potentially represent a focal area of diffuse large B-cell lymphoma for which a core biopsy would ultimately be needed to prove this.  At this point time, the patient is more interested in getting some form of treatment started for the low-grade follicular lymphoma,  particularly as it is impacting his right eye.  Based upon this, I will start this gentleman on Revlimid/Rituxan.  Revlimid will be given at 20 mg daily, every 3 out of 4 weeks.  Rituxan will be given at 375 mg per metered square

## 2021-09-23 ENCOUNTER — Telehealth: Payer: Self-pay

## 2021-09-23 ENCOUNTER — Other Ambulatory Visit (HOSPITAL_COMMUNITY): Payer: Self-pay

## 2021-09-23 ENCOUNTER — Encounter: Payer: Self-pay | Admitting: Oncology

## 2021-09-23 ENCOUNTER — Telehealth: Payer: Self-pay | Admitting: Pharmacy Technician

## 2021-09-23 ENCOUNTER — Telehealth: Payer: Self-pay | Admitting: Oncology

## 2021-09-23 ENCOUNTER — Inpatient Hospital Stay: Payer: PPO | Attending: Oncology | Admitting: Oncology

## 2021-09-23 ENCOUNTER — Other Ambulatory Visit: Payer: Self-pay | Admitting: Oncology

## 2021-09-23 VITALS — BP 182/79 | HR 65 | Temp 98.6°F | Resp 16 | Ht 69.0 in | Wt 199.7 lb

## 2021-09-23 DIAGNOSIS — C8291 Follicular lymphoma, unspecified, lymph nodes of head, face, and neck: Secondary | ICD-10-CM | POA: Insufficient documentation

## 2021-09-23 DIAGNOSIS — C8298 Follicular lymphoma, unspecified, lymph nodes of multiple sites: Secondary | ICD-10-CM

## 2021-09-23 DIAGNOSIS — Z79899 Other long term (current) drug therapy: Secondary | ICD-10-CM | POA: Insufficient documentation

## 2021-09-23 DIAGNOSIS — Z5112 Encounter for antineoplastic immunotherapy: Secondary | ICD-10-CM | POA: Insufficient documentation

## 2021-09-23 MED ORDER — LENALIDOMIDE 10 MG PO CAPS
ORAL_CAPSULE | ORAL | 11 refills | Status: DC
  Filled 2021-09-23: qty 42, fill #0

## 2021-09-23 NOTE — Telephone Encounter (Signed)
Oral Oncology Patient Advocate Encounter ? ?Prior Authorization for Lenalidomide '10MG'$  has been approved.   ? ?PA# Key: [id number] - PA Case ID: [id number] ?Effective dates: 09/23/21 through 09/23/22 ? ?Patients co-pay is over $6,000 for 1 month supply. Will look into patient assistance options for patient. ? ?Oral Oncology Clinic will continue to follow.  ? ?

## 2021-09-23 NOTE — Telephone Encounter (Signed)
Per 09/23/21 los next appt scheduled and confirmed with patient ?

## 2021-09-23 NOTE — Progress Notes (Signed)
START ON PATHWAY REGIMEN - Lymphoma and CLL ? ? ?  A cycle is every 28 days: ?    Lenalidomide  ?    Rituximab-xxxx  ?    Rituximab-xxxx  ? ?**Always confirm dose/schedule in your pharmacy ordering system** ? ?Patient Characteristics: ?Follicular Lymphoma, Grades 1, 2, and 3A, Third Line, Not a Candidate for CAR T-Cell Therapy, No Prior Lenalidomide ?Disease Type: Follicular Lymphoma, Grade 1, 2, or 3A ?Disease Type: Not Applicable ?Disease Type: Not Applicable ?Line of Therapy: Third Line ?Patient Characteristics: Not a Candidate for CAR T-Cell Therapy ?Treatment History: No Prior Lenalidomide ?Intent of Therapy: ?Non-Curative / Palliative Intent, Discussed with Patient ?

## 2021-09-23 NOTE — Telephone Encounter (Signed)
Sent PAP application to office ?

## 2021-09-23 NOTE — Telephone Encounter (Addendum)
Oral Oncology Pharmacist Encounter ? ?Received new prescription for lenalidomide (Revlimid) for the treatment of follicular lymphoma in conjunction with rituximab, planned duration until disease progression or unacceptable toxicity or for a total of 12 cycles. ? ?Labs from 05/02/21 assessed, Creatinine 1.3 leading to creatinine clearance of 56 (BMP was scanned in- not showing up on results for labs). Repeat BMP was drawn and calculated creatinine clearance of 61. Patient will continue with full dose of revlimid '20mg'$  daily.  ? ?Current medication list in Epic reviewed, DDIs with Revlimid identified: none ? ?Evaluated chart and no patient barriers to medication adherence noted.  ? ?Patient agreement for treatment documented in MD note on 09/23/2021. ? ?Prescription has been e-scribed to the [facility] for benefits analysis and approval. ? ?Oral Oncology Clinic will continue to follow for insurance authorization, copayment issues, initial counseling and start date. ? ?Drema Halon, PharmD ?Hematology/Oncology Clinical Pharmacist ?[facility] ?[phone] ?09/23/2021 1:22 PM ? ?

## 2021-09-23 NOTE — Telephone Encounter (Signed)
Received new start notification for  Revlimid '10mg'$  . Will update as we work through the benefits process. ? ?Submitted a Prior Authorization request to  [employer]  for  Lenalidomide '10MG'$   via CoverMyMeds. Will update once we receive a response. ? ?KeyOctaviano Hodges - PA Case ID: [id number] ? ?

## 2021-09-24 NOTE — Progress Notes (Signed)
Faxed in application for free Revlimid to [employer]. ?

## 2021-09-26 NOTE — Telephone Encounter (Signed)
Office faxed application to [employer] on 4/5 ?

## 2021-09-28 IMAGING — PT NM PET TUM IMG RESTAG (PS) SKULL BASE T - THIGH
3 series · 12 of 12 positions shown · non-contrast
Comparison: Multiple prior studies most recent from Saturday January, 2018
in Friday June, 2018.

CLINICAL DATA: Subsequent treatment strategy for hematologic
malignancy, follicular lymphoma in a 79-year-old male.

EXAM:
NUCLEAR MEDICINE PET SKULL BASE TO THIGH
TECHNIQUE: 9.5 mCi F-18 FDG was injected intravenously. Full-ring PET imaging
was performed from the skull base to thigh after the radiotracer. CT
data was obtained and used for attenuation correction and anatomic
localization.
Fasting blood glucose: 98 mg/dl

[Series 1102: results mm oncology reading · 1.0mm · 0.89mm/px · 2 of 2 slices shown (1 of 3)]
[im 1/2]
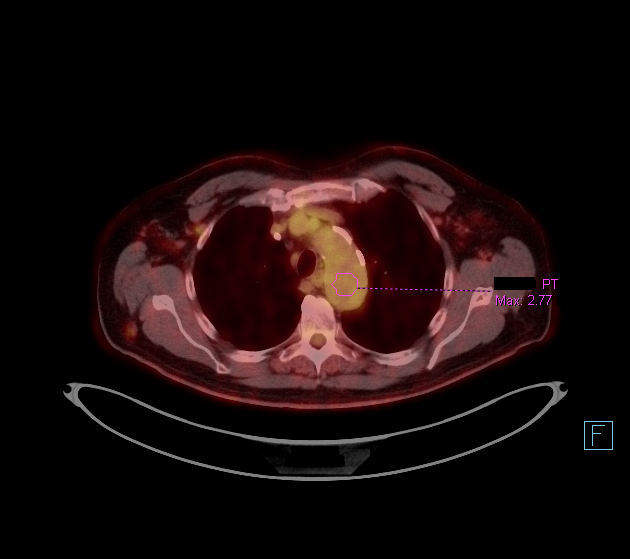
[im 2/2]
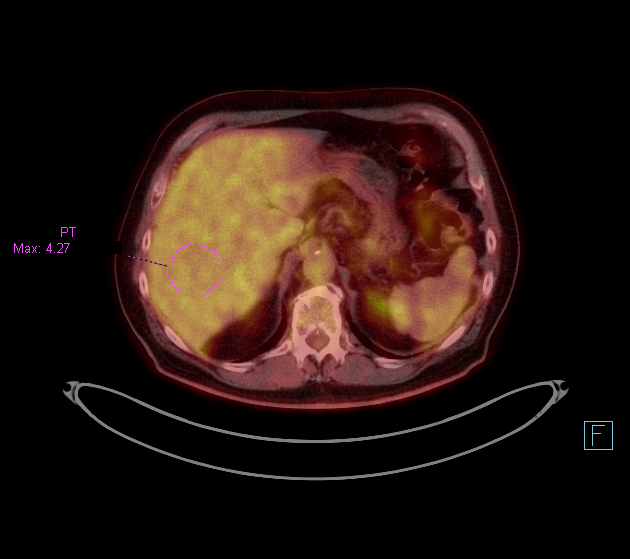

[Series 1656: results mm oncology reading · 4.0mm · 1.20mm/px · 9 of 9 slices shown (2 of 3)]
[im 1/9]
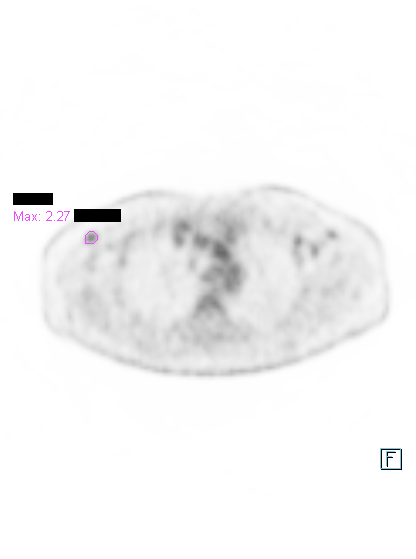
[im 2/9]
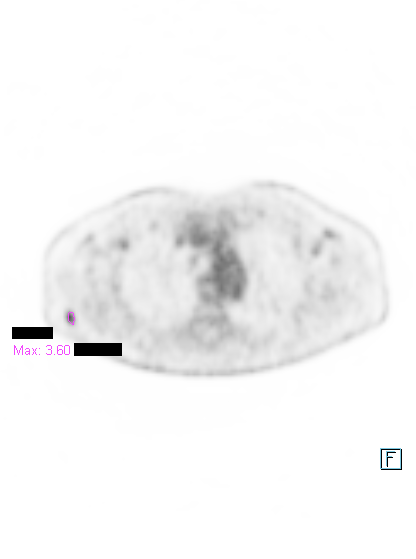
[im 3/9]
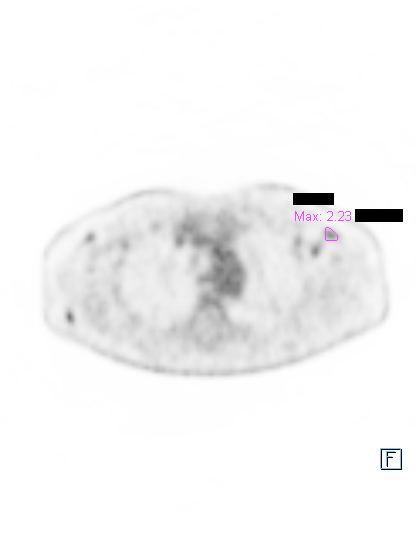
[im 4/9]
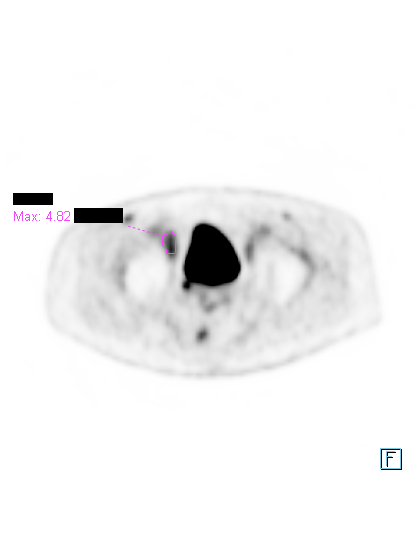
[im 5/9]
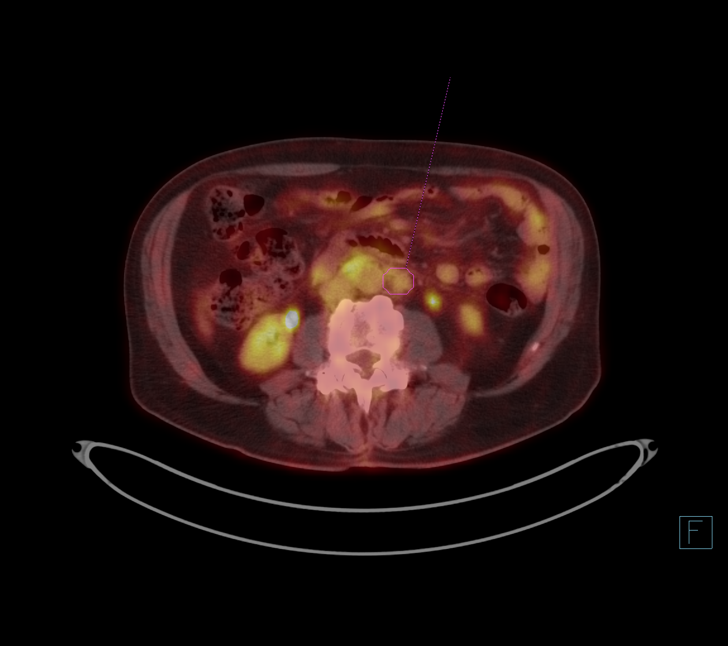
[im 6/9]
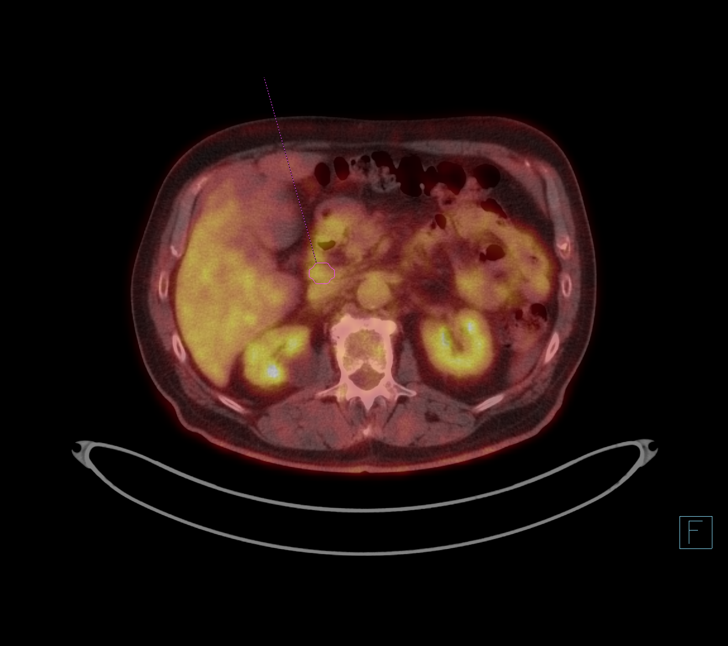
[im 7/9]
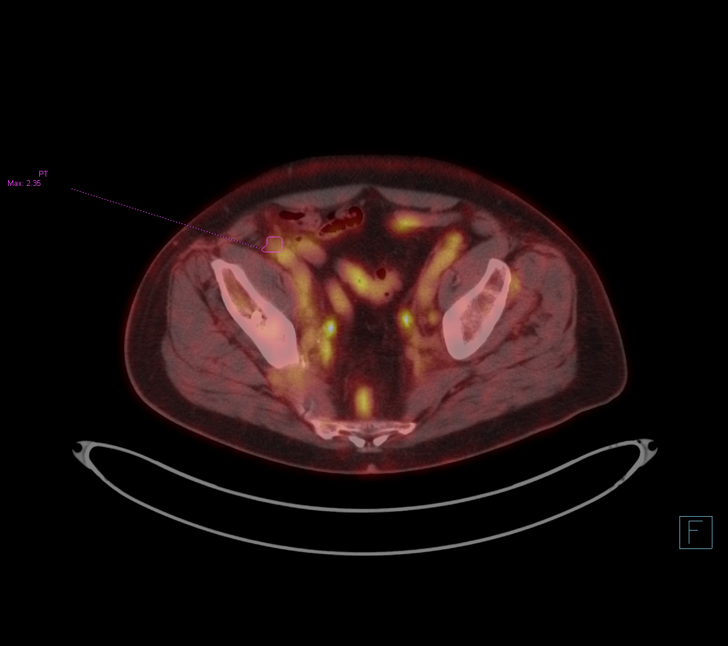
[im 8/9]
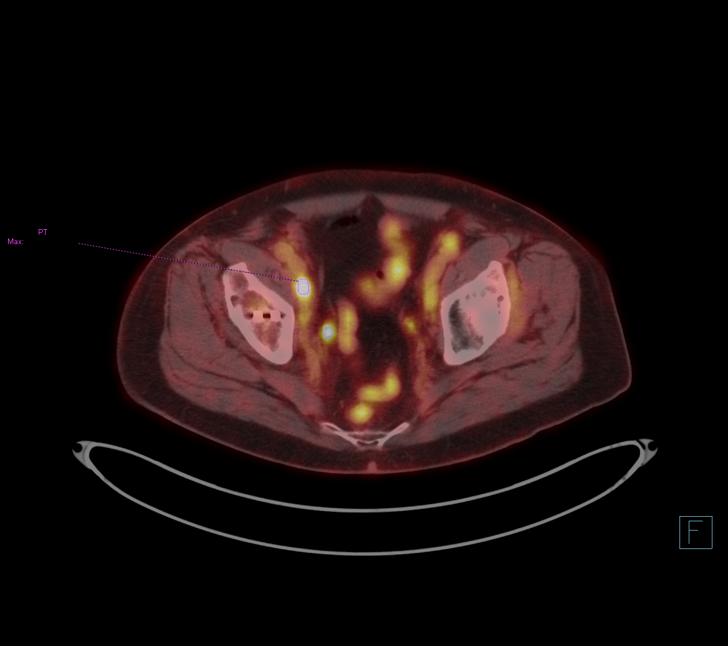
[im 9/9]
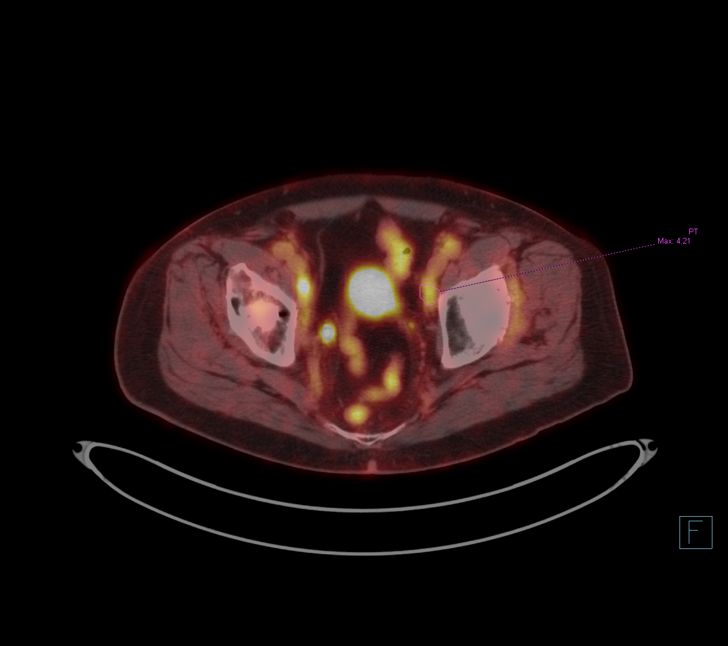

[results mm oncology reading · 1.0mm · 0.49mm/px · 1 of 1 slices shown (3 of 3)]
[im 1/1]
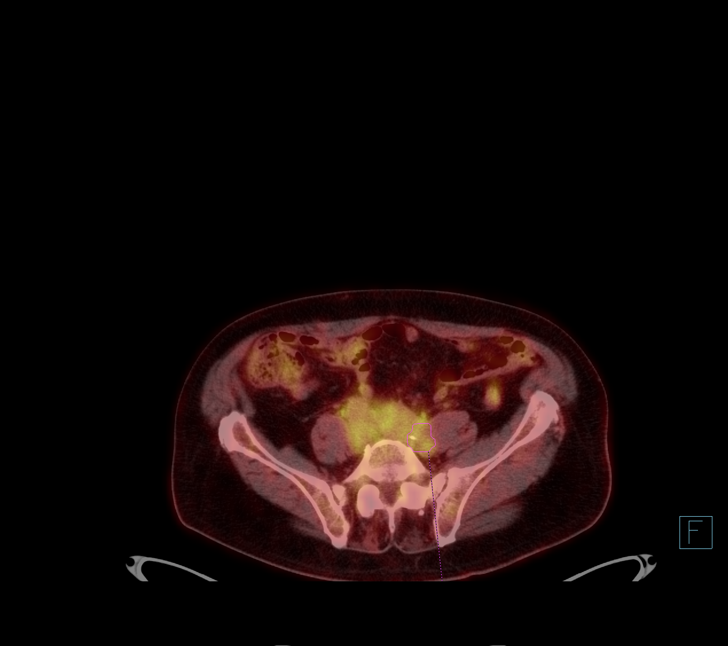

[12 of 12 positions shown; findings below may reference images not displayed]

FINDINGS: Mediastinal blood pool activity: SUV max

Liver activity: SUV max

NECK: No hypermetabolic lymph nodes in the neck.

Incidental CT findings: none

CHEST: Bilateral axillary lymph nodes with mild FDG uptake without
change in size compared to previous imaging. These are less than a
cm but display mild increased FDG uptake though less than
mediastinal blood pool in the LEFT and RIGHT axilla. There is
however a new focus of FDG uptake corresponding to a subcutaneous
nodule or small lymph node adjacent to the RIGHT shoulder on image
66/4 measuring 8 mm short axis with a maximum SUV of 3.6, this was
not present previously.

No mediastinal lymph nodes with significant FDG uptake. No
suspicious pulmonary nodules. Stable tiny nodule in the RIGHT upper
lobe on image 61/4 at 4 mm.

Incidental CT findings: Calcified atheromatous plaque of the
thoracic aorta. The Port-A-Cath terminates in the caval to atrial
junction. Heart size normal without pericardial effusion. Normal
caliber central pulmonary vessels. Limited assessment of
cardiovascular structures given lack of intravenous contrast.
Esophagus grossly normal. The lungs are clear.

ABDOMEN/PELVIS: No solid organ uptake.

LEFT periaortic lymph nodes measuring 14 as compared to 16 mm short
axis (image 137/4) maximum SUV 2.8 as compared to 3.7.

Portacaval lymph node (image 124/412 mm short axis with a maximum
SUV of 3.5 as compared to 3.

Similar appearance of lower abdominal and additional retroperitoneal
nodal tissue, obscured by streak artifact from adjacent hardware.

RIGHT external iliac lymph node diminished in size previously
greater than 2 cm short axis now at 1.3 cm (image 172/4) SUV of
as compared to 6.4.

RIGHT pelvic sidewall lymph node diminished in size (image 175/4) cm
previously 1.7 cm but with intense FDG uptake with maximum SUV of
14. This previously showed a maximum SUV of

LEFT pelvic nodal tissue with similar appearance compared to
previous imaging, LEFT pelvic sidewall/external iliac lymph node 11
mm (image 177/4) maximum SUV of 4.2, previously showing a maximum
SUV of 6.0.

Incidental CT findings: Liver, gallbladder, pancreas, spleen and
adrenal glands are unremarkable. Cyst arises from the upper pole the
RIGHT kidney. No hydronephrosis. Urinary bladder collapsed, grossly
normal. No acute gastrointestinal process. Appearance of the
gastrointestinal tract unremarkable with normal appendix.

Stranding in the pelvis likely related to post treatment changes
with similar appearance, indistinct soft tissue about the iliac
bifurcation. Pelvic lymph nodes as described above.

SKELETON: No focal hypermetabolic activity to suggest skeletal
metastasis.

Incidental CT findings: RIGHT hip arthroplasty and spinal
degenerative changes. Also with signs of spinal fusion in the lower
lumbar spine as before.
IMPRESSION: On balance worsening of disease with markedly hypermetabolic area in
particular along the RIGHT pelvic sidewall with a maximum SUV of 14,
compatible with [HOSPITAL] 5 disease

New area of moderate FDG uptake in the soft tissues about the RIGHT
shoulder as well with a maximum SUV of 3.6.

Otherwise stable to slight interval improvement of existing disease.

Aortic Atherosclerosis ([id number].M).

## 2021-09-29 ENCOUNTER — Inpatient Hospital Stay: Payer: PPO

## 2021-09-29 ENCOUNTER — Encounter: Payer: Self-pay | Admitting: Hematology and Oncology

## 2021-09-29 ENCOUNTER — Inpatient Hospital Stay (INDEPENDENT_AMBULATORY_CARE_PROVIDER_SITE_OTHER): Payer: PPO | Admitting: Hematology and Oncology

## 2021-09-29 VITALS — BP 140/73 | HR 76 | Temp 98.3°F | Resp 18 | Ht 69.0 in | Wt 201.1 lb

## 2021-09-29 DIAGNOSIS — Z5112 Encounter for antineoplastic immunotherapy: Secondary | ICD-10-CM | POA: Diagnosis not present

## 2021-09-29 DIAGNOSIS — Z79899 Other long term (current) drug therapy: Secondary | ICD-10-CM | POA: Diagnosis not present

## 2021-09-29 DIAGNOSIS — C8298 Follicular lymphoma, unspecified, lymph nodes of multiple sites: Secondary | ICD-10-CM

## 2021-09-29 DIAGNOSIS — C8291 Follicular lymphoma, unspecified, lymph nodes of head, face, and neck: Secondary | ICD-10-CM | POA: Diagnosis not present

## 2021-09-29 DIAGNOSIS — C8290 Follicular lymphoma, unspecified, unspecified site: Secondary | ICD-10-CM | POA: Diagnosis not present

## 2021-09-29 LAB — BASIC METABOLIC PANEL
BUN: 21 (ref 4–21)
CO2: 30 — AB (ref 13–22)
Chloride: 100 (ref 99–108)
Creatinine: 1.2 (ref 0.6–1.3)
Glucose: 136
Potassium: 4 mEq/L (ref 3.5–5.1)
Sodium: 140 (ref 137–147)

## 2021-09-29 LAB — HEPATIC FUNCTION PANEL
ALT: 17 U/L (ref 10–40)
AST: 20 (ref 14–40)
Alkaline Phosphatase: 44 (ref 25–125)
Bilirubin, Total: 0.7

## 2021-09-29 LAB — CBC AND DIFFERENTIAL
HCT: 43 (ref 41–53)
Hemoglobin: 14.3 (ref 13.5–17.5)
Neutrophils Absolute: 4.09
Platelets: 179 10*3/uL (ref 150–400)
WBC: 6.2

## 2021-09-29 LAB — COMPREHENSIVE METABOLIC PANEL
Albumin: 4.5 (ref 3.5–5.0)
Calcium: 9.5 (ref 8.7–10.7)

## 2021-09-29 LAB — HEPATITIS B CORE ANTIBODY, TOTAL: Hep B Core Total Ab: NONREACTIVE

## 2021-09-29 LAB — CBC: RBC: 5.05 (ref 3.87–5.11)

## 2021-09-29 MED ORDER — PROCHLORPERAZINE MALEATE 10 MG PO TABS: 10.0000 mg | ORAL_TABLET | Freq: Four times a day (QID) | ORAL | 3 refills | Status: DC | PRN

## 2021-09-29 MED ORDER — ONDANSETRON HCL 4 MG PO TABS: 4.0000 mg | ORAL_TABLET | ORAL | 3 refills | Status: DC | PRN

## 2021-09-29 NOTE — Telephone Encounter (Signed)
Received update from [employer] rep: ? ? ?Re: [name] March 27, 1941 Revlimid ? ?  ? ?Please be advised that the request for free medication has been referred to [facility] [facility]), an independent TEFL teacher. We have submitted the [employer] Access Support application you have provided, along with our benefits investigation to [employer] for their review. From this point on, [employer] Access [employer] (formerly [employer] Patient [employer]) will no longer be able to provide status updates regarding the patient mentioned above. If you would like to contact [employer] regarding this referral, please do so by calling [phone]. ? ?  ?Thanks so much and have a great day! ? ?  ?Jimmye Norman, RPhT, CPhT ? ?Patient Access Specialist  Patient Access Support Services, Hematology/Oncology ?

## 2021-09-29 NOTE — Progress Notes (Addendum)
[facility] CARE CLINIC CONSULT NOTE [facility]  [phone] [phone]  Patient Care Team: Noni Saupe, MD as PCP - General (Unknown Physician Specialty) [place], Peter M, MD as PCP - Cardiology (Cardiology) Benjiman Core, MD as Consulting Physician (Oncology) Weston Settle, MD as Consulting Physician (Oncology) Olegario Shearer, MD as Referring Physician (Dermatology)   Name of the patient: Dionta Sylte  [id number]  September 27, 1940   Date of visit: 10/06/21  Diagnosis- Follicular Lymphoma  Chief complaint/Reason for visit- Initial Meeting for [facility], preparing for starting chemotherapy  Heme/Onc history:  Oncology History  Follicular lymphoma (HCC)  07/29/2011 Initial Diagnosis   Follicular lymphoma (HCC)    10/02/2021 -  Chemotherapy   Patient is on Treatment Plan : LYMPHOMA Lenalidomide + Rituximab q28d         Interval history-  Patient presents to chemo care clinic today for initial meeting in preparation for starting chemotherapy. I introduced the chemo care clinic and we discussed that the role of the clinic is to assist those who are at an increased risk of emergency room visits and/or complications during the course of chemotherapy treatment. We discussed that the increased risk takes into account factors such as age, performance status, and co-morbidities. We also discussed that for some, this might include barriers to care such as not having a primary care provider, lack of insurance/transportation, or not being able to afford medications. We discussed that the goal of the program is to help prevent unplanned ER visits and help reduce complications during chemotherapy. We do this by discussing specific risk factors to each individual and identifying ways that we can help improve these risk factors and reduce barriers to care.   Allergies  Allergen Reactions   Gabapentin Nausea Only and Other (See Comments)     dizziness   Lipitor [Atorvastatin] Other (See Comments)    Myalgias     Past Medical History:  Diagnosis Date   Arthritis    knee osteoarthritis   Cancer (HCC)    nhl   Hypercholesterolemia    Hypertension    Lymphoma (HCC)    Nonischemic cardiomyopathy (HCC) 03/07/2015    Past Surgical History:  Procedure Laterality Date   arthroscopies Bilateral    Left eye surgery  2013   THYROIDECTOMY, PARTIAL Left    vocal cord     polyps removed    Social History   Socioeconomic History   Marital status: Married    Spouse name: Not on file   Number of children: 3   Years of education: Not on file   Highest education level: Not on file  Occupational History   Occupation: retired-engineer  Tobacco Use   Smoking status: Former    Types: Cigarettes    Quit date: 01/20/1974    Years since quitting: 47.7   Smokeless tobacco: Former    Quit date: 06/22/1973  Vaping Use   Vaping Use: Never used  Substance and Sexual Activity   Alcohol use: Never   Drug use: Never   Sexual activity: Not on file  Other Topics Concern   Not on file  Social History Narrative   Patient is very active, walks 3 miles a day, former Chief Financial Officer, 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 3 children [place] in [place], 2 in [place]   Social Determinants of Health   Financial Resource Strain: Not on file  Food Insecurity: Not  on file  Transportation Needs: Not on file  Physical Activity: Not on file  Stress: Not on file  Social Connections: Not on file  Intimate Partner Violence: Not on file    Family History  Problem Relation Age of Onset   Cancer Brother    Cancer Brother    Cancer Brother    Cancer Brother    Cancer Brother      Current Outpatient Medications:    ondansetron (ZOFRAN) 4 MG tablet, Take 1 tablet (4 mg total) by mouth every 4 (four) hours as needed for nausea., Disp: 90 tablet, Rfl: 3   prochlorperazine (COMPAZINE) 10 MG tablet,  Take 1 tablet (10 mg total) by mouth every 6 (six) hours as needed for nausea or vomiting., Disp: 90 tablet, Rfl: 3   aspirin EC 81 MG tablet, Take 81 mg by mouth daily. Swallow whole., Disp: , Rfl:    calcium & magnesium carbonates (MYLANTA) 311-232 MG per tablet, Take 1 tablet by mouth 2 (two) times daily., Disp: , Rfl:    Cholecalciferol (VITAMIN D3 PO), Take 500 Units by mouth 2 (two) times daily., Disp: , Rfl:    COENZYME Q-10 PO, Take 50 mg by mouth 2 (two) times daily. , Disp: , Rfl:    DOCOSAHEXAENOIC ACID PO, Take 1 g by mouth daily. , Disp: , Rfl:    enalapril (VASOTEC) 10 MG tablet, Take 10 mg by mouth daily., Disp: , Rfl: 0   famotidine (PEPCID) 40 MG tablet, Take 40 mg by mouth at bedtime., Disp: , Rfl:    fish oil-omega-3 fatty acids 1000 MG capsule, Take 1 g by mouth daily., Disp: , Rfl:    glucosamine-chondroitin 500-400 MG tablet, Take 1 tablet by mouth 2 (two) times daily. , Disp: , Rfl:    lenalidomide (REVLIMID) 10 MG capsule, Take 2 pills (20 mg) by mouth daily, Take every 3 out of 4 weeks., Disp: 42 capsule, Rfl: 11   neomycin-polymyxin b-dexamethasone (MAXITROL) 3.5-10000-0.1 SUSP, Place 1 drop into the right eye 4 (four) times daily., Disp: , Rfl:    pravastatin (PRAVACHOL) 80 MG tablet, Take 80 mg by mouth daily. , Disp: , Rfl: 1   saw palmetto 500 MG capsule, Take 500 mg by mouth 2 (two) times daily., Disp: , Rfl:    vitamin B-12 (CYANOCOBALAMIN) 1000 MCG tablet, Take 1,000 mcg by mouth daily. , Disp: , Rfl:   Current Facility-Administered Medications:    triamcinolone acetonide (KENALOG-40) injection 20 mg, 20 mg, Other, Once, Asencion Islam, DPM  Facility-Administered Medications Ordered in Other Visits:    sodium chloride flush (NS) 0.9 % injection 10 mL, 10 mL, Intracatheter, PRN, Lewis, Dequincy A, MD, 10 mL at 05/01/20 1210   sodium chloride flush (NS) 0.9 % injection 10 mL, 10 mL, Intracatheter, PRN, Lewis, Dequincy A, MD, 10 mL at 08/27/21 1447     Latest  Ref Rng & Units 09/29/2021   12:00 AM  CMP  BUN 4 - 21 21       Creatinine 0.6 - 1.3 1.2       Sodium 137 - 147 140       Potassium 3.5 - 5.1 mEq/L 4.0       Chloride 99 - 108 100       CO2 13 - 22 30       Calcium 8.7 - 10.7 9.5       Alkaline Phos 25 - 125 44       AST 14 - 40  20       ALT 10 - 40 U/L 17          This result is from an external source.      Latest Ref Rng & Units 09/29/2021   12:00 AM  CBC  WBC  6.2       Hemoglobin 13.5 - 17.5 14.3       Hematocrit 41 - 53 43       Platelets 150 - 400 K/uL 179          This result is from an external source.    No images are attached to the encounter.  NM PET Image Restage (PS) Skull Base to Thigh (F-18 FDG)  Result Date: 09/23/2021 CLINICAL DATA:  Subsequent treatment strategy for grade 3 follicular lymphoma. EXAM: NUCLEAR MEDICINE PET SKULL BASE TO THIGH TECHNIQUE: 9.95 mCi F-18 FDG was injected intravenously. Full-ring PET imaging was performed from the skull base to thigh after the radiotracer. CT data was obtained and used for attenuation correction and anatomic localization. Fasting blood glucose: 116 mg/dl COMPARISON:  PET-CT 74/25/9563 and 12/31/2015. FINDINGS: Mediastinal blood pool activity: SUV max 2.8 Liver activity: SUV max 3.8 NECK: No hypermetabolic cervical lymph nodes are identified.Activity associated with the muscles of phonation and the lymphoid tissue of Waldeyer's ring is within physiologic limits. There is increased asymmetric activity anteriorly in the right orbit without clear CT correlate, probably physiologic or inflammatory. There is a small focus of hypermetabolic activity along the peripheral aspect of the right parotid gland (SUV max 4.5), likely an incidental parotid lesion, although potentially an overlying superficial lymph node. Incidental CT findings: none CHEST: Small mildly hypermetabolic axillary lymph nodes bilaterally are unchanged with metabolic activity approaching blood pool intensity. No  hypermetabolic mediastinal or hilar lymph nodes are seen. The previously demonstrated small hypermetabolic superficial nodule posterolateral to the right shoulder has nearly completely resolved. No hypermetabolic pulmonary activity or suspicious nodularity. Incidental CT findings: Right subclavian Port-A-Cath extends to the upper SVC level. Atherosclerosis of the aorta, great vessels and coronary arteries. ABDOMEN/PELVIS: There is no hypermetabolic activity within the liver, adrenal glands, spleen or pancreas. Interval increased multifocal retroperitoneal and pelvic hypermetabolic lymphadenopathy. Within the confluent retroperitoneal soft tissue surrounding the aorta and IVC, there is focal retrocaval hypermetabolic activity at the level of the lower pole of the kidneys demonstrating an SUV max of 13.0. There is intense hypermetabolic activity (SUV max 45.1) within an enlarging right pelvic sidewall node which now measures 5.6 x 3.2 cm on image 178/4. Previously, this node had a short axis dimension of 1.0 cm and an SUV max of 14.3. There is increased metabolic activity within a right inguinal node (SUV max 8.1. Incidental CT findings: No evidence of bowel or ureteral obstruction. Aortic and branch vessel atherosclerosis is noted. There is a stable simple appearing cyst in the mid right kidney for which no follow-up is necessary. SKELETON: There is no hypermetabolic activity to suggest osseous metastatic disease. Incidental CT findings: Postsurgical changes in the lower lumbar spine. IMPRESSION: 1. Definite progression of hypermetabolic retroperitoneal, pelvic and right inguinal adenopathy consistent with progressive lymphoma (Deau[place] 5). 2. Equivocal stable involvement of small lymph nodes in the superficial portion of the right parotid gland and both axilla. 3. No evidence of solid organ or marrow involvement. 4.  Aortic Atherosclerosis (ICD10-I70.0). Electronically Signed   By: Carey Bullocks M.D.   On:  09/23/2021 08:30     Assessment and plan- Patient is a 81 y.o. male who  presents to [facility] for initial meeting in preparation for starting chemotherapy for the treatment of follicular lympoma.   Chemo Care Clinic/High Risk for ER/Hospitalization during chemotherapy- We discussed the role of the chemo care clinic and identified patient specific risk factors. I discussed that patient was identified as high risk primarily based on:  Patient has past medical history positive for: Past Medical History:  Diagnosis Date   Arthritis    knee osteoarthritis   Cancer (HCC)    nhl   Hypercholesterolemia    Hypertension    Lymphoma (HCC)    Nonischemic cardiomyopathy (HCC) 03/07/2015    Patient has past surgical history positive for: Past Surgical History:  Procedure Laterality Date   arthroscopies Bilateral    Left eye surgery  2013   THYROIDECTOMY, PARTIAL Left    vocal cord     polyps removed    Provided general information including the following: 1.  Date of education: 09/29/2021 2.  Physician name: Dr. Melvyn Neth 3.  Diagnosis: Follicular lymphoma 4.  Stage:  5.  Control 6.  Chemotherapy plan including drugs and how often: Rituximab 7.  Start date: 10/02/2021 8.  Other referrals: None at this time 9.  The patient is to call our office with any questions or concerns.  Our office number [phone], if after hours or on the weekend, call the same number and wait for the answering service.  There is always an oncologist on call 10.  Medications prescribed: Ondansetron, Prochlorperazine 11.  The patient has verbalized understanding of the treatment plan and has no barriers to adherence or understanding.  Obtained signed consent from patient.  Discussed symptoms including 1.  Low blood counts including red blood cells, white blood cells and platelets. 2. Infection including to avoid large crowds, wash hands frequently, and stay away from people who were sick.  If fever  develops of 100.4 or higher, call our office. 3.  Mucositis-given instructions on mouth rinse (baking soda and salt mixture).  Keep mouth clean.  Use soft bristle toothbrush.  If mouth sores develop, call our clinic. 4.  Nausea/vomiting-gave prescriptions for ondansetron 4 mg every 4 hours as needed for nausea, may take around the clock if persistent.  Compazine 10 mg every 6 hours, may take around the clock if persistent. 5.  Diarrhea-use over-the-counter Imodium.  Call clinic if not controlled. 6.  Constipation-use senna, 1 to 2 tablets twice a day.  If no BM in 2 to 3 days call the clinic. 7.  Loss of appetite-try to eat small meals every 2-3 hours.  Call clinic if not eating. 8.  Taste changes-zinc 500 mg daily.  If becomes severe call clinic. 9.  Alcoholic beverages. 10.  Drink 2 to 3 quarts of water per day. 11.  Peripheral neuropathy-patient to call if numbness or tingling in hands or feet is persistent  Neulasta-will be given 24 to 48 hours after chemotherapy.  Gave information sheet on bone and joint pain.  Use Claritin or Pepcid.  May use ibuprofen or Aleve.  Call if symptoms persist or are unbearable.  Gave information on the supportive care team and how to contact them regarding services.  Discussed advanced directives.  The patient does not have their advanced directives but will look at the copy provided in their notebook and will call with any questions. Spiritual Nutrition Financial Social worker Advanced directives  Answered questions to patient satisfaction.  Patient is to call with any further questions or concerns.  The medication prescribed  to the patient will be printed out from [email or web address] This will give the following information: Name of your medication Approved uses Dose and schedule Storage and handling Handling body fluids and waste Drug and food interactions Possible side effects and management Pregnancy, sexual activity, and contraception Obtaining  medication   We discussed that social determinants of health may have significant impacts on health and outcomes for cancer patients.  Today we discussed specific social determinants of performance status, alcohol use, depression, financial needs, food insecurity, housing, interpersonal violence, social connections, stress, tobacco use, and transportation.    After lengthy discussion the following were identified as areas of need:   Outpatient services: We discussed options including home based and outpatient services, DME and care program. We discusssed that patients who participate in regular physical activity report fewer negative impacts of cancer and treatments and report less fatigue.   Financial Concerns: We discussed that living with cancer can create tremendous financial burden.  We discussed options for assistance. I asked that if assistance is needed in affording medications or paying bills to please let us know so that we can provide assistance. We discussed options for food including social services and onsite food pantry.  We will also notify Mady Haagensen to see if cancer center can provide additional support.  Referral to Social work: Introduced Child psychotherapist Mady Haagensen and the services he can provide such as support with utility bill, cell phone and gas vouchers.   Support groups: We discussed options for support groups at the cancer center. If interested, please notify nurse navigator to enroll. We discussed options for managing stress including healthy eating, exercise as well as participating in no charge counseling services at the cancer center and support groups.  If these are of interest, patient can notify either myself or primary nursing team.We discussed options for management including medications and referral to quit Smart program  Transportation: We discussed options for transportation including [employer], paratransit, bus routes, link transit, taxi/uber/lyft, and cancer  center van.  I have notified primary oncology team who will help assist with arranging Zenaida Niece transportation for appointments when/if needed. We also discussed options for transportation on short notice/acute visits.  Palliative care services: We have palliative care services available in the cancer center to discuss goals of care and advanced care planning.  Please let us know if you have any questions or would like to speak to our palliative nurse practitioner.  Symptom Management Clinic: We discussed our symptom management clinic which is available for acute concerns while receiving treatment such as nausea, vomiting or diarrhea.  We can be reached via telephone at [phone] or through my chart.  We are available for virtual or in person visits on the same day from 830 to 4 PM Monday through Friday. He denies needing specific assistance at this time and He will be followed by Dr. Melvyn Neth clinical team.  Plan: Discussed symptom management clinic. Discussed palliative care services. Discussed resources that are available here at the cancer center. Discussed medications and new prescriptions to begin treatment such as anti-nausea or steroids.   Disposition: RTC on   Visit Diagnosis 1. Follicular lymphoma of lymph nodes of multiple regions, unspecified follicular lymphoma type [facility])     Patient expressed understanding and was in agreement with this plan. He also understands that He can call clinic at any time with any questions, concerns, or complaints.   I provided 30 minutes of  face to face  during this encounter, and > 50%  was spent counseling as documented under my assessment & plan.   Ilda Basset, FNP- [facility]

## 2021-09-30 LAB — HEPATITIS B SURFACE ANTIGEN: Hepatitis B Surface Ag: NONREACTIVE

## 2021-09-30 NOTE — Telephone Encounter (Signed)
Called [employer] to get turnaround time for patient's application. Rep advised we should have determination by Monday and they will reach out to patient that same day to schedule shipment. Will follow up. ? ?Phone# [phone] ?

## 2021-10-01 ENCOUNTER — Encounter: Payer: Self-pay | Admitting: Oncology

## 2021-10-01 MED ORDER — LENALIDOMIDE 10 MG PO CAPS: ORAL_CAPSULE | ORAL | 11 refills | Status: DC

## 2021-10-01 NOTE — Telephone Encounter (Signed)
Called [employer], they need rx escribed to [employer] [facility]. And once received, they will reach out to patient to schedule. ? ?Called patient, left HIPPA complaint voicemail advising of approval and that she should look out for a phone call to set up shipment over the next few business days. ?

## 2021-10-02 ENCOUNTER — Inpatient Hospital Stay: Payer: PPO

## 2021-10-02 ENCOUNTER — Other Ambulatory Visit: Payer: Self-pay

## 2021-10-02 VITALS — BP 139/74 | HR 77 | Temp 98.2°F | Resp 18 | Ht 69.0 in | Wt 197.0 lb

## 2021-10-02 DIAGNOSIS — Z5112 Encounter for antineoplastic immunotherapy: Secondary | ICD-10-CM | POA: Diagnosis not present

## 2021-10-02 DIAGNOSIS — C8298 Follicular lymphoma, unspecified, lymph nodes of multiple sites: Secondary | ICD-10-CM

## 2021-10-02 MED ORDER — DIPHENHYDRAMINE HCL 25 MG PO CAPS
50.0000 mg | ORAL_CAPSULE | Freq: Once | ORAL | Status: AC
  Administered 2021-10-02: 50 mg via ORAL
  Filled 2021-10-02: qty 2

## 2021-10-02 MED ORDER — ACETAMINOPHEN 325 MG PO TABS
650.0000 mg | ORAL_TABLET | Freq: Once | ORAL | Status: AC
  Administered 2021-10-02: 650 mg via ORAL
  Filled 2021-10-02: qty 2

## 2021-10-02 MED ORDER — SODIUM CHLORIDE 0.9 % IV SOLN: Freq: Once | INTRAVENOUS | Status: AC

## 2021-10-02 MED ORDER — SODIUM CHLORIDE 0.9 % IV SOLN
375.0000 mg/m2 | Freq: Once | INTRAVENOUS | Status: AC
  Administered 2021-10-02: 800 mg via INTRAVENOUS
  Filled 2021-10-02: qty 30

## 2021-10-02 NOTE — Patient Instructions (Signed)
Rituximab Injection ?What is this medication? ?RITUXIMAB (ri TUX i mab) is a monoclonal antibody. It is used to treat certain types of cancer like non-Hodgkin lymphoma and chronic lymphocytic leukemia. It is also used to treat rheumatoid arthritis, granulomatosis with polyangiitis, microscopic polyangiitis, and pemphigus vulgaris. ?This medicine may be used for other purposes; ask your health care provider or pharmacist if you have questions. ?COMMON BRAND NAME(S): RIABNI, Rituxan, RUXIENCE ?What should I tell my care team before I take this medication? ?They need to know if you have any of these conditions: ?chest pain ?heart disease ?infection especially a viral infection such as chickenpox, cold sores, hepatitis B, or herpes ?immune system problems ?irregular heartbeat or rhythm ?kidney disease ?low blood counts (white cells, platelets, or red cells) ?lung disease ?recent or upcoming vaccine ?an unusual or allergic reaction to rituximab, other medicines, foods, dyes, or preservatives ?pregnant or trying to get pregnant ?breast-feeding ?How should I use this medication? ?This medicine is injected into a vein. It is given by a health care provider in a hospital or clinic setting. ?A special MedGuide will be given to you before each treatment. Be sure to read this information carefully each time. ?Talk to your health care provider about the use of this medicine in children. While this drug may be prescribed for children as young as 6 months for selected conditions, precautions do apply. ?Overdosage: If you think you have taken too much of this medicine contact a poison control center or emergency room at once. ?NOTE: This medicine is only for you. Do not share this medicine with others. ?What if I miss a dose? ?Keep appointments for follow-up doses. It is important not to miss your dose. Call your health care provider if you are unable to keep an appointment. ?What may interact with this medication? ?Do not take  this medicine with any of the following medicines: ?live vaccines ?This medicine may also interact with the following medicines: ?cisplatin ?This list may not describe all possible interactions. Give your health care provider a list of all the medicines, herbs, non-prescription drugs, or dietary supplements you use. Also tell them if you smoke, drink alcohol, or use illegal drugs. Some items may interact with your medicine. ?What should I watch for while using this medication? ?Your condition will be monitored carefully while you are receiving this medicine. You may need blood work done while you are taking this medicine. ?This medicine can cause serious infusion reactions. To reduce the risk your health care provider may give you other medicines to take before receiving this one. Be sure to follow the directions from your health care provider. ?This medicine may increase your risk of getting an infection. Call your health care provider for advice if you get a fever, chills, sore throat, or other symptoms of a cold or flu. Do not treat yourself. Try to avoid being around people who are sick. ?Call your health care provider if you are around anyone with measles, chickenpox, or if you develop sores or blisters that do not heal properly. ?Avoid taking medicines that contain aspirin, acetaminophen, ibuprofen, naproxen, or ketoprofen unless instructed by your health care provider. These medicines may hide a fever. ?This medicine may cause serious skin reactions. They can happen weeks to months after starting the medicine. Contact your health care provider right away if you notice fevers or flu-like symptoms with a rash. The rash may be red or purple and then turn into blisters or peeling of the skin. Or, you might  notice a red rash with swelling of the face, lips or lymph nodes in your neck or under your arms. ?In some patients, this medicine may cause a serious brain infection that may cause death. If you have any  problems seeing, thinking, speaking, walking, or standing, tell your healthcare professional right away. If you cannot reach your healthcare professional, urgently seek other source of medical care. ?Do not become pregnant while taking this medicine or for at least 12 months after stopping it. Women should inform their health care provider if they wish to become pregnant or think they might be pregnant. There is potential for serious harm to an unborn child. Talk to your health care provider for more information. Women should use a reliable form of birth control while taking this medicine and for 12 months after stopping it. Do not breast-feed while taking this medicine or for at least 6 months after stopping it. ?What side effects may I notice from receiving this medication? ?Side effects that you should report to your health care provider as soon as possible: ?allergic reactions (skin rash, itching or hives; swelling of the face, lips, or tongue) ?diarrhea ?edema (sudden weight gain; swelling of the ankles, feet, hands or other unusual swelling; trouble breathing) ?fast, irregular heartbeat ?heart attack (trouble breathing; pain or tightness in the chest, neck, back or arms; unusually weak or tired) ?infection (fever, chills, cough, sore throat, pain or trouble passing urine) ?kidney injury (trouble passing urine or change in the amount of urine) ?liver injury (dark yellow or brown urine; general ill feeling or flu-like symptoms; loss of appetite, right upper belly pain; unusually weak or tired, yellowing of the eyes or skin) ?low blood pressure (dizziness; feeling faint or lightheaded, falls; unusually weak or tired) ?low red blood cell counts (trouble breathing; feeling faint; lightheaded, falls; unusually weak or tired) ?mouth sores ?redness, blistering, peeling, or loosening of the skin, including inside the mouth ?stomach pain ?unusual bruising or bleeding ?wheezing (trouble breathing with loud or whistling  sounds) ?vomiting ?Side effects that usually do not require medical attention (report to your health care provider if they continue or are bothersome): ?headache ?joint pain ?muscle cramps, pain ?nausea ?This list may not describe all possible side effects. Call your doctor for medical advice about side effects. You may report side effects to FDA at 1-800-FDA-1088. ?Where should I keep my medication? ?This medicine is given in a hospital or clinic. It will not be stored at home. ?NOTE: This sheet is a summary. It may not cover all possible information. If you have questions about this medicine, talk to your doctor, pharmacist, or health care provider. ?? 2022 Elsevier/Gold Standard (2020-06-10 00:00:00) ? ?

## 2021-10-02 NOTE — Progress Notes (Addendum)
..  Pharmacist Chemotherapy Monitoring - Initial Assessment   ? ?Anticipated start date: 10/02/21 ? ?The following has been reviewed per standard work regarding the patient's treatment regimen: ?The patient's diagnosis, treatment plan and drug doses, and organ/hematologic function ?Lab orders and baseline tests specific to treatment regimen  ?The treatment plan start date, drug sequencing, and pre-medications ?Prior authorization status  ?Patient's documented medication list, including drug-drug interaction screen and prescriptions for anti-emetics and supportive care specific to the treatment regimen ?The drug concentrations, fluid compatibility, administration routes, and timing of the medications to be used ?The patient's access for treatment and lifetime cumulative dose history, if applicable  ?The patient's medication allergies and previous infusion related reactions, if applicable  ? ?Changes made to treatment plan:  ?N/A ? ?Follow up needed:  ?N/A ? ? ?Juanetta Beets, [facility], ?10/01/2021  2:36 PM  ?

## 2021-10-03 ENCOUNTER — Telehealth: Payer: Self-pay

## 2021-10-03 NOTE — Telephone Encounter (Signed)
I spoke with pt to see how he is doing since new infusion of Rituxan yesterday. Pt states, "I'm alright, nothing different than usual". He denies rashes and other side effects. He has not received his Revlimid as of yet. He will be getting the Revlimid from [facility] by [employer].  ?

## 2021-10-06 NOTE — Telephone Encounter (Signed)
Patient's medication is delivering 10/06/21 ?

## 2021-10-06 NOTE — Telephone Encounter (Signed)
Oral Chemotherapy Pharmacist Encounter  ? ?Attempted to reach patient to provide update and offer for initial counseling on oral medication: Revlimid (lenalidomide).  ? ?No answer. Left voicemail for patient to call back to discuss details of medication and initial counseling session. ? ?Leron Croak, PharmD, BCPS ?Hematology/Oncology Clinical Pharmacist ?[facility] Oral Chemotherapy Navigation Clinics ?[phone] ?10/06/2021 10:02 AM ? ? ?

## 2021-10-06 NOTE — Telephone Encounter (Signed)
Oral Chemotherapy Pharmacist Encounter ? ?I spoke with patient for overview of: lenalidomide (Revlimid) for the treatment of follicular lymphoma in conjunction with rituximab (first dose 10/02/21), planned duration until disease progression or unacceptable toxicity or for a total of 12 cycles. ? ?Counseled patient on administration, dosing, side effects, monitoring, drug-food interactions, safe handling, storage, and disposal. ? ?Patient will take Revlimid '10mg'$  capsules, 2 capsules (20 mg total) by mouth once daily, without regard to food, with a full glass of water. Patient knows to take the 2 capsules at the same time. ? ?Revlimid will be given 21 days on, 7 days off, repeat every 28 days. ? ?Revlimid start date: 10/06/21 PM ? ?Adverse effects of Revlimid include but are not limited to: nausea, constipation, diarrhea, abdominal pain, rash, fatigue, and decreased blood counts.   ? ?Reviewed with patient importance of keeping a medication schedule and plan for any missed doses. No barriers to medication adherence identified. ? ?Medication reconciliation performed and medication/allergy list updated. ? ?Patient counseled on importance of daily aspirin '81mg'$  for VTE prophylaxis. ? ?Insurance authorization for Revlimid has been obtained. ? ?Revlimid prescription is being dispensed from [facility] by [facility] ([place] [place]) specialty pharmacy as it is a limited distribution medication. ? ?All questions answered. ? ?Sean Hodges voiced understanding and appreciation.  ? ?Medication education handout placed in mail for patient. Patient knows to call the office with questions or concerns. Oral Chemotherapy Clinic phone number provided to patient.  ? ?Leron Croak, PharmD, BCPS ?Hematology/Oncology Clinical Pharmacist ?[facility] Oral Chemotherapy Navigation Clinics ?[phone] ?10/06/2021 11:40 AM ?

## 2021-10-07 ENCOUNTER — Inpatient Hospital Stay: Payer: PPO

## 2021-10-07 DIAGNOSIS — C8298 Follicular lymphoma, unspecified, lymph nodes of multiple sites: Secondary | ICD-10-CM

## 2021-10-07 DIAGNOSIS — D649 Anemia, unspecified: Secondary | ICD-10-CM | POA: Diagnosis not present

## 2021-10-07 DIAGNOSIS — C8290 Follicular lymphoma, unspecified, unspecified site: Secondary | ICD-10-CM | POA: Diagnosis not present

## 2021-10-07 LAB — BASIC METABOLIC PANEL
BUN: 19 (ref 4–21)
CO2: 29 — AB (ref 13–22)
Chloride: 102 (ref 99–108)
Creatinine: 1.1 (ref 0.6–1.3)
Glucose: 104
Potassium: 4.3 mEq/L (ref 3.5–5.1)
Sodium: 139 (ref 137–147)

## 2021-10-07 LAB — CBC AND DIFFERENTIAL
HCT: 42 (ref 41–53)
Hemoglobin: 14 (ref 13.5–17.5)
Neutrophils Absolute: 4.86
Platelets: 213 10*3/uL (ref 150–400)
WBC: 7.6

## 2021-10-07 LAB — HEPATIC FUNCTION PANEL
ALT: 16 U/L (ref 10–40)
AST: 19 (ref 14–40)
Alkaline Phosphatase: 47 (ref 25–125)
Bilirubin, Total: 0.8

## 2021-10-07 LAB — CBC: RBC: 4.96 (ref 3.87–5.11)

## 2021-10-07 LAB — COMPREHENSIVE METABOLIC PANEL
Albumin: 4.3 (ref 3.5–5.0)
Calcium: 9.5 (ref 8.7–10.7)

## 2021-10-08 ENCOUNTER — Telehealth: Payer: Self-pay

## 2021-10-08 ENCOUNTER — Other Ambulatory Visit: Payer: Self-pay | Admitting: Hematology and Oncology

## 2021-10-08 MED ORDER — HYDROXYZINE PAMOATE 25 MG PO CAPS: 25.0000 mg | ORAL_CAPSULE | Freq: Three times a day (TID) | ORAL | 0 refills | Status: DC | PRN

## 2021-10-08 NOTE — Progress Notes (Signed)
..  Rapid Infusion Rituximab Pharmacist Evaluation ? ?Sean Hodges is a 81 y.o. male being treated with rituximab for follicular lymphoma. ? ? This patient may be considered for RIR.  ? ?A pharmacist has verified the patient tolerated rituximab infusions per the [facility] standard infusion protocol without grade 3-4 infusion reactions. The treatment plan will be updated to reflect RIR if the patient qualifies per the checklist below:  ? ?Age > 64 years old Yes   ?Clinically significant cardiovascular disease No   ?Circulating lymphocyte count < 5000/uL prior to cycle two Yes  ?Lab Results  ?Component Value Date  ? LYMPHSABS 1.7 12/31/2015  ?  ?Prior documented grade 3-4 infusion reaction to rituximab No   ?Prior documented grade 1-2 infusion reaction to rituximab (If YES, Pharmacist will confirm with Physician if patient is still a candidate for RIR) No   ?Previous rituximab infusion within the past 6 months No   ?Treatment Plan updated orders to reflect RIR Yes   ? ?Sean Hodges does meet the criteria for Rapid Infusion Rituximab. This patient is going to be switched to rapid infusion rituximab.  ? ?Sean Hodges ?10/08/21 12:03 PM  ? ?

## 2021-10-08 NOTE — Telephone Encounter (Addendum)
No visible rash or hives. Ice pack helps for few minutes. ?----- Message from Melodye Ped, NP sent at 10/08/2021  8:26 AM EDT ----- ?Regarding: RE: Severe itching on scalp ?It could be a side effect of the rituximab. Is there a visible rash or hives? I can send in vistaril to see if that relieves. ?----- Message ----- ?From: Dairl Ponder, RN ?Sent: 10/08/2021   8:10 AM EDT ?To: Melodye Ped, NP ?Subject: Severe itching on scalp                       ? ?Pt called this morning to report sever itching to his scalp. He states to the point he couldn't sleep. He started tx on Monday and is asking if this is a side effect? ? ? ?

## 2021-10-09 ENCOUNTER — Inpatient Hospital Stay: Payer: PPO

## 2021-10-09 DIAGNOSIS — C8298 Follicular lymphoma, unspecified, lymph nodes of multiple sites: Secondary | ICD-10-CM

## 2021-10-09 DIAGNOSIS — Z5112 Encounter for antineoplastic immunotherapy: Secondary | ICD-10-CM | POA: Diagnosis not present

## 2021-10-09 MED ORDER — ACETAMINOPHEN 325 MG PO TABS
650.0000 mg | ORAL_TABLET | Freq: Once | ORAL | Status: AC
  Administered 2021-10-09: 650 mg via ORAL
  Filled 2021-10-09: qty 2

## 2021-10-09 MED ORDER — SODIUM CHLORIDE 0.9 % IV SOLN
375.0000 mg/m2 | Freq: Once | INTRAVENOUS | Status: AC
  Administered 2021-10-09: 800 mg via INTRAVENOUS
  Filled 2021-10-09: qty 50

## 2021-10-09 MED ORDER — SODIUM CHLORIDE 0.9 % IV SOLN: Freq: Once | INTRAVENOUS | Status: AC

## 2021-10-09 MED ORDER — DIPHENHYDRAMINE HCL 25 MG PO CAPS
50.0000 mg | ORAL_CAPSULE | Freq: Once | ORAL | Status: AC
  Administered 2021-10-09: 50 mg via ORAL
  Filled 2021-10-09: qty 2

## 2021-10-09 MED ORDER — HEPARIN SOD (PORK) LOCK FLUSH 100 UNIT/ML IV SOLN
500.0000 [IU] | Freq: Once | INTRAVENOUS | Status: AC | PRN
  Administered 2021-10-09: 500 [IU]

## 2021-10-09 MED ORDER — SODIUM CHLORIDE 0.9% FLUSH
10.0000 mL | INTRAVENOUS | Status: DC | PRN
  Administered 2021-10-09: 10 mL

## 2021-10-09 NOTE — Patient Instructions (Signed)
Rituximab Injection What is this medication? RITUXIMAB (ri TUX i mab) is a monoclonal antibody. It is used to treat certain types of cancer like non-Hodgkin lymphoma and chronic lymphocytic leukemia. It is also used to treat rheumatoid arthritis, granulomatosis with polyangiitis, microscopic polyangiitis, and pemphigus vulgaris. This medicine may be used for other purposes; ask your health care provider or pharmacist if you have questions. COMMON BRAND NAME(S): RIABNI, Rituxan, RUXIENCE, truxima What should I tell my care team before I take this medication? They need to know if you have any of these conditions: chest pain heart disease infection especially a viral infection such as chickenpox, cold sores, hepatitis B, or herpes immune system problems irregular heartbeat or rhythm kidney disease low blood counts (white cells, platelets, or red cells) lung disease recent or upcoming vaccine an unusual or allergic reaction to rituximab, other medicines, foods, dyes, or preservatives pregnant or trying to get pregnant breast-feeding How should I use this medication? This medicine is injected into a vein. It is given by a health care provider in a hospital or clinic setting. A special MedGuide will be given to you before each treatment. Be sure to read this information carefully each time. Talk to your health care provider about the use of this medicine in children. While this drug may be prescribed for children as young as 6 months for selected conditions, precautions do apply. Overdosage: If you think you have taken too much of this medicine contact a poison control center or emergency room at once. NOTE: This medicine is only for you. Do not share this medicine with others. What if I miss a dose? Keep appointments for follow-up doses. It is important not to miss your dose. Call your health care provider if you are unable to keep an appointment. What may interact with this medication? Do not  take this medicine with any of the following medicines: live vaccines This medicine may also interact with the following medicines: cisplatin This list may not describe all possible interactions. Give your health care provider a list of all the medicines, herbs, non-prescription drugs, or dietary supplements you use. Also tell them if you smoke, drink alcohol, or use illegal drugs. Some items may interact with your medicine. What should I watch for while using this medication? Your condition will be monitored carefully while you are receiving this medicine. You may need blood work done while you are taking this medicine. This medicine can cause serious infusion reactions. To reduce the risk your health care provider may give you other medicines to take before receiving this one. Be sure to follow the directions from your health care provider. This medicine may increase your risk of getting an infection. Call your health care provider for advice if you get a fever, chills, sore throat, or other symptoms of a cold or flu. Do not treat yourself. Try to avoid being around people who are sick. Call your health care provider if you are around anyone with measles, chickenpox, or if you develop sores or blisters that do not heal properly. Avoid taking medicines that contain aspirin, acetaminophen, ibuprofen, naproxen, or ketoprofen unless instructed by your health care provider. These medicines may hide a fever. This medicine may cause serious skin reactions. They can happen weeks to months after starting the medicine. Contact your health care provider right away if you notice fevers or flu-like symptoms with a rash. The rash may be red or purple and then turn into blisters or peeling of the skin. Or, you   might notice a red rash with swelling of the face, lips or lymph nodes in your neck or under your arms. In some patients, this medicine may cause a serious brain infection that may cause death. If you have any  problems seeing, thinking, speaking, walking, or standing, tell your healthcare professional right away. If you cannot reach your healthcare professional, urgently seek other source of medical care. Do not become pregnant while taking this medicine or for at least 12 months after stopping it. Women should inform their health care provider if they wish to become pregnant or think they might be pregnant. There is potential for serious harm to an unborn child. Talk to your health care provider for more information. Women should use a reliable form of birth control while taking this medicine and for 12 months after stopping it. Do not breast-feed while taking this medicine or for at least 6 months after stopping it. What side effects may I notice from receiving this medication? Side effects that you should report to your health care provider as soon as possible: allergic reactions (skin rash, itching or hives; swelling of the face, lips, or tongue) diarrhea edema (sudden weight gain; swelling of the ankles, feet, hands or other unusual swelling; trouble breathing) fast, irregular heartbeat heart attack (trouble breathing; pain or tightness in the chest, neck, back or arms; unusually weak or tired) infection (fever, chills, cough, sore throat, pain or trouble passing urine) kidney injury (trouble passing urine or change in the amount of urine) liver injury (dark yellow or brown urine; general ill feeling or flu-like symptoms; loss of appetite, right upper belly pain; unusually weak or tired, yellowing of the eyes or skin) low blood pressure (dizziness; feeling faint or lightheaded, falls; unusually weak or tired) low red blood cell counts (trouble breathing; feeling faint; lightheaded, falls; unusually weak or tired) mouth sores redness, blistering, peeling, or loosening of the skin, including inside the mouth stomach pain unusual bruising or bleeding wheezing (trouble breathing with loud or whistling  sounds) vomiting Side effects that usually do not require medical attention (report to your health care provider if they continue or are bothersome): headache joint pain muscle cramps, pain nausea This list may not describe all possible side effects. Call your doctor for medical advice about side effects. You may report side effects to FDA at 1-800-FDA-1088. Where should I keep my medication? This medicine is given in a hospital or clinic. It will not be stored at home. NOTE: This sheet is a summary. It may not cover all possible information. If you have questions about this medicine, talk to your doctor, pharmacist, or health care provider.  2023 Elsevier/Gold Standard (2020-06-10 00:00:00)  

## 2021-10-13 ENCOUNTER — Telehealth: Payer: Self-pay

## 2021-10-13 NOTE — Telephone Encounter (Signed)
Dr Bobby Rumpf states pt can have both procedures done. ? ?I called pt back and notified him of Dr Bobby Rumpf and Susan's response below. He verbalized understanding.  Pt had a couple of other questions. 1. Is it ok if his dermatologist freezes a couple of skin cancers off? He has appt w/orthopedic and may get cortisone injection in his knee. Will that bother anything? ? ?Ulice Dash, pharmacist: There is nothing specific recommended.  Stretching exercises before bed can sometimes help.  B complex vitamins can also help in some cases.   ? ?Dr Bobby Rumpf states pt should see decrease in swelling within weeks. ? ?I spoke with pt. He reports he is taking the medication @ 8pm after supper. He states the scalp itching is much better. He denies N/V, diarrhea, fevers, & skin rash. He has also noticed "some leg cramps and of course fatigue". Pt asked "how long would it be before he should see any change in his right eye"? He states he feels it is worse @ this point. "Could it be weeks, months, or what"? ?

## 2021-10-14 ENCOUNTER — Inpatient Hospital Stay: Payer: PPO

## 2021-10-14 DIAGNOSIS — D649 Anemia, unspecified: Secondary | ICD-10-CM | POA: Diagnosis not present

## 2021-10-14 DIAGNOSIS — C8290 Follicular lymphoma, unspecified, unspecified site: Secondary | ICD-10-CM | POA: Diagnosis not present

## 2021-10-14 DIAGNOSIS — C8298 Follicular lymphoma, unspecified, lymph nodes of multiple sites: Secondary | ICD-10-CM

## 2021-10-14 LAB — BASIC METABOLIC PANEL
BUN: 15 (ref 4–21)
CO2: 33 — AB (ref 13–22)
Chloride: 99 (ref 99–108)
Creatinine: 1.1 (ref 0.6–1.3)
Glucose: 95
Potassium: 3.9 mEq/L (ref 3.5–5.1)
Sodium: 140 (ref 137–147)

## 2021-10-14 LAB — CBC AND DIFFERENTIAL
HCT: 42 (ref 41–53)
Hemoglobin: 13.9 (ref 13.5–17.5)
Neutrophils Absolute: 3.38
Platelets: 166 10*3/uL (ref 150–400)
WBC: 6.5

## 2021-10-14 LAB — HEPATIC FUNCTION PANEL
ALT: 22 U/L (ref 10–40)
AST: 20 (ref 14–40)
Alkaline Phosphatase: 47 (ref 25–125)
Bilirubin, Total: 0.6

## 2021-10-14 LAB — COMPREHENSIVE METABOLIC PANEL
Albumin: 4.1 (ref 3.5–5.0)
Calcium: 9.4 (ref 8.7–10.7)

## 2021-10-14 LAB — CBC: RBC: 4.96 (ref 3.87–5.11)

## 2021-10-16 ENCOUNTER — Inpatient Hospital Stay: Payer: PPO

## 2021-10-16 VITALS — BP 145/74 | HR 67 | Temp 98.2°F | Resp 18 | Ht 69.0 in | Wt 197.5 lb

## 2021-10-16 DIAGNOSIS — C8298 Follicular lymphoma, unspecified, lymph nodes of multiple sites: Secondary | ICD-10-CM

## 2021-10-16 DIAGNOSIS — Z5112 Encounter for antineoplastic immunotherapy: Secondary | ICD-10-CM | POA: Diagnosis not present

## 2021-10-16 MED ORDER — SODIUM CHLORIDE 0.9 % IV SOLN
375.0000 mg/m2 | Freq: Once | INTRAVENOUS | Status: AC
  Administered 2021-10-16: 800 mg via INTRAVENOUS
  Filled 2021-10-16: qty 50

## 2021-10-16 MED ORDER — SODIUM CHLORIDE 0.9 % IV SOLN: Freq: Once | INTRAVENOUS | Status: AC

## 2021-10-16 MED ORDER — ACETAMINOPHEN 325 MG PO TABS
650.0000 mg | ORAL_TABLET | Freq: Once | ORAL | Status: AC
  Administered 2021-10-16: 650 mg via ORAL
  Filled 2021-10-16: qty 2

## 2021-10-16 MED ORDER — HEPARIN SOD (PORK) LOCK FLUSH 100 UNIT/ML IV SOLN
500.0000 [IU] | Freq: Once | INTRAVENOUS | Status: AC | PRN
  Administered 2021-10-16: 500 [IU]

## 2021-10-16 MED ORDER — SODIUM CHLORIDE 0.9% FLUSH
10.0000 mL | INTRAVENOUS | Status: DC | PRN
  Administered 2021-10-16: 10 mL

## 2021-10-16 MED ORDER — DIPHENHYDRAMINE HCL 25 MG PO CAPS
50.0000 mg | ORAL_CAPSULE | Freq: Once | ORAL | Status: AC
  Administered 2021-10-16: 50 mg via ORAL
  Filled 2021-10-16: qty 2

## 2021-10-16 NOTE — Patient Instructions (Signed)
Rituximab Injection What is this medication? RITUXIMAB (ri TUX i mab) is a monoclonal antibody. It is used to treat certain types of cancer like non-Hodgkin lymphoma and chronic lymphocytic leukemia. It is also used to treat rheumatoid arthritis, granulomatosis with polyangiitis, microscopic polyangiitis, and pemphigus vulgaris. This medicine may be used for other purposes; ask your health care provider or pharmacist if you have questions. COMMON BRAND NAME(S): RIABNI, Rituxan, RUXIENCE, truxima What should I tell my care team before I take this medication? They need to know if you have any of these conditions: chest pain heart disease infection especially a viral infection such as chickenpox, cold sores, hepatitis B, or herpes immune system problems irregular heartbeat or rhythm kidney disease low blood counts (white cells, platelets, or red cells) lung disease recent or upcoming vaccine an unusual or allergic reaction to rituximab, other medicines, foods, dyes, or preservatives pregnant or trying to get pregnant breast-feeding How should I use this medication? This medicine is injected into a vein. It is given by a health care provider in a hospital or clinic setting. A special MedGuide will be given to you before each treatment. Be sure to read this information carefully each time. Talk to your health care provider about the use of this medicine in children. While this drug may be prescribed for children as young as 6 months for selected conditions, precautions do apply. Overdosage: If you think you have taken too much of this medicine contact a poison control center or emergency room at once. NOTE: This medicine is only for you. Do not share this medicine with others. What if I miss a dose? Keep appointments for follow-up doses. It is important not to miss your dose. Call your health care provider if you are unable to keep an appointment. What may interact with this medication? Do not  take this medicine with any of the following medicines: live vaccines This medicine may also interact with the following medicines: cisplatin This list may not describe all possible interactions. Give your health care provider a list of all the medicines, herbs, non-prescription drugs, or dietary supplements you use. Also tell them if you smoke, drink alcohol, or use illegal drugs. Some items may interact with your medicine. What should I watch for while using this medication? Your condition will be monitored carefully while you are receiving this medicine. You may need blood work done while you are taking this medicine. This medicine can cause serious infusion reactions. To reduce the risk your health care provider may give you other medicines to take before receiving this one. Be sure to follow the directions from your health care provider. This medicine may increase your risk of getting an infection. Call your health care provider for advice if you get a fever, chills, sore throat, or other symptoms of a cold or flu. Do not treat yourself. Try to avoid being around people who are sick. Call your health care provider if you are around anyone with measles, chickenpox, or if you develop sores or blisters that do not heal properly. Avoid taking medicines that contain aspirin, acetaminophen, ibuprofen, naproxen, or ketoprofen unless instructed by your health care provider. These medicines may hide a fever. This medicine may cause serious skin reactions. They can happen weeks to months after starting the medicine. Contact your health care provider right away if you notice fevers or flu-like symptoms with a rash. The rash may be red or purple and then turn into blisters or peeling of the skin. Or, you   might notice a red rash with swelling of the face, lips or lymph nodes in your neck or under your arms. In some patients, this medicine may cause a serious brain infection that may cause death. If you have any  problems seeing, thinking, speaking, walking, or standing, tell your healthcare professional right away. If you cannot reach your healthcare professional, urgently seek other source of medical care. Do not become pregnant while taking this medicine or for at least 12 months after stopping it. Women should inform their health care provider if they wish to become pregnant or think they might be pregnant. There is potential for serious harm to an unborn child. Talk to your health care provider for more information. Women should use a reliable form of birth control while taking this medicine and for 12 months after stopping it. Do not breast-feed while taking this medicine or for at least 6 months after stopping it. What side effects may I notice from receiving this medication? Side effects that you should report to your health care provider as soon as possible: allergic reactions (skin rash, itching or hives; swelling of the face, lips, or tongue) diarrhea edema (sudden weight gain; swelling of the ankles, feet, hands or other unusual swelling; trouble breathing) fast, irregular heartbeat heart attack (trouble breathing; pain or tightness in the chest, neck, back or arms; unusually weak or tired) infection (fever, chills, cough, sore throat, pain or trouble passing urine) kidney injury (trouble passing urine or change in the amount of urine) liver injury (dark yellow or brown urine; general ill feeling or flu-like symptoms; loss of appetite, right upper belly pain; unusually weak or tired, yellowing of the eyes or skin) low blood pressure (dizziness; feeling faint or lightheaded, falls; unusually weak or tired) low red blood cell counts (trouble breathing; feeling faint; lightheaded, falls; unusually weak or tired) mouth sores redness, blistering, peeling, or loosening of the skin, including inside the mouth stomach pain unusual bruising or bleeding wheezing (trouble breathing with loud or whistling  sounds) vomiting Side effects that usually do not require medical attention (report to your health care provider if they continue or are bothersome): headache joint pain muscle cramps, pain nausea This list may not describe all possible side effects. Call your doctor for medical advice about side effects. You may report side effects to FDA at 1-800-FDA-1088. Where should I keep my medication? This medicine is given in a hospital or clinic. It will not be stored at home. NOTE: This sheet is a summary. It may not cover all possible information. If you have questions about this medicine, talk to your doctor, pharmacist, or health care provider.  2023 Elsevier/Gold Standard (2020-06-10 00:00:00)  

## 2021-10-17 ENCOUNTER — Other Ambulatory Visit: Payer: Self-pay

## 2021-10-21 ENCOUNTER — Other Ambulatory Visit: Payer: PPO

## 2021-10-21 ENCOUNTER — Inpatient Hospital Stay (INDEPENDENT_AMBULATORY_CARE_PROVIDER_SITE_OTHER): Payer: PPO | Admitting: Hematology and Oncology

## 2021-10-21 ENCOUNTER — Encounter: Payer: Self-pay | Admitting: Hematology and Oncology

## 2021-10-21 ENCOUNTER — Inpatient Hospital Stay: Payer: PPO | Attending: Oncology

## 2021-10-21 DIAGNOSIS — C8298 Follicular lymphoma, unspecified, lymph nodes of multiple sites: Secondary | ICD-10-CM

## 2021-10-21 DIAGNOSIS — Z5112 Encounter for antineoplastic immunotherapy: Secondary | ICD-10-CM | POA: Insufficient documentation

## 2021-10-21 DIAGNOSIS — Z79899 Other long term (current) drug therapy: Secondary | ICD-10-CM | POA: Insufficient documentation

## 2021-10-21 DIAGNOSIS — C8290 Follicular lymphoma, unspecified, unspecified site: Secondary | ICD-10-CM | POA: Diagnosis not present

## 2021-10-21 DIAGNOSIS — C8291 Follicular lymphoma, unspecified, lymph nodes of head, face, and neck: Secondary | ICD-10-CM | POA: Insufficient documentation

## 2021-10-21 LAB — HEPATIC FUNCTION PANEL
ALT: 25 U/L (ref 10–40)
AST: 17 (ref 14–40)
Alkaline Phosphatase: 45 (ref 25–125)
Bilirubin, Total: 0.7

## 2021-10-21 LAB — CBC: RBC: 4.88 (ref 3.87–5.11)

## 2021-10-21 LAB — BASIC METABOLIC PANEL
BUN: 18 (ref 4–21)
CO2: 35 — AB (ref 13–22)
Chloride: 100 (ref 99–108)
Creatinine: 1.1 (ref 0.6–1.3)
Glucose: 97
Potassium: 4 mEq/L (ref 3.5–5.1)
Sodium: 140 (ref 137–147)

## 2021-10-21 LAB — CBC AND DIFFERENTIAL
HCT: 42 (ref 41–53)
Hemoglobin: 13.7 (ref 13.5–17.5)
Neutrophils Absolute: 4.33
Platelets: 108 10*3/uL — AB (ref 150–400)
WBC: 7.1

## 2021-10-21 LAB — COMPREHENSIVE METABOLIC PANEL
Albumin: 4.1 (ref 3.5–5.0)
Calcium: 9 (ref 8.7–10.7)

## 2021-10-21 NOTE — Assessment & Plan Note (Signed)
An 81 y.o. male with biopsy-proven recurrence of low-grade follicular lymphoma.  He also has a history of diffuse large B-cell lymphoma.His disease is the most prominent within his retroperitoneal and pelvic regions.  There is 1 nodal station in his right pelvic area that is extremely hypermetabolic, with an SUV of 74.8.  The patient understands that I am concerned this area may potentially represent a focal area of diffuse large B-cell lymphoma for which a core biopsy would ultimately be needed to prove this.  At this point time, the patient is more interested in getting some form of treatment started for the low-grade follicular lymphoma, particularly as it is impacting his right eye. He was started on Revlimid/Rituxan.  Revlimid will be given at 20 mg daily, every 3 out of 4 weeks.  Rituxan will be given at 375 mg per metered squared weekly x 4 weeks.  Afterwards, Rituxan will be given once every 4 weeks.  Each cycle of Revlimid/Rituxan will be repeated every 4 weeks for up to 12 cycles.  He is tolerating treatment well with minimal side effects. In fact, he does notice improvement to his right eye. We discussed nausea medications and I told him he is fine to pursue a cortisone injection to her left knee and to have suspicious skin lesions removed with dermatology. He will keep scheduled follow up with Dr. Bobby Rumpf ?? ?? ?

## 2021-10-21 NOTE — Progress Notes (Cosign Needed)
?Patient Care Team: ?[name], MD as PCP - General (Unknown Physician Specialty) ?Martinique, Peter M, MD as PCP - Cardiology (Cardiology) ?Wyatt Portela, MD as Consulting Physician (Oncology) ?Marice Potter, MD as Consulting Physician (Oncology) ?Jolene Schimke, MD as Referring Physician (Dermatology) ? ?Clinic Day:  10/21/2021 ? ?Referring physician: [name], MD ? ?ASSESSMENT & PLAN:  ? ?Assessment & Plan: ?Follicular lymphoma ([place]) ?An 81 y.o. male with biopsy-proven recurrence of low-grade follicular lymphoma.  He also has a history of diffuse large B-cell lymphoma.His disease is the most prominent within his retroperitoneal and pelvic regions.  There is 1 nodal station in his right pelvic area that is extremely hypermetabolic, with an SUV of 09.3.  The patient understands that I am concerned this area may potentially represent a focal area of diffuse large B-cell lymphoma for which a core biopsy would ultimately be needed to prove this.  At this point time, the patient is more interested in getting some form of treatment started for the low-grade follicular lymphoma, particularly as it is impacting his right eye. He was started on Revlimid/Rituxan.  Revlimid will be given at 20 mg daily, every 3 out of 4 weeks.  Rituxan will be given at 375 mg per metered squared weekly x 4 weeks.  Afterwards, Rituxan will be given once every 4 weeks.  Each cycle of Revlimid/Rituxan will be repeated every 4 weeks for up to 12 cycles.  He is tolerating treatment well with minimal side effects. In fact, he does notice improvement to his right eye. We discussed nausea medications and I told him he is fine to pursue a cortisone injection to her left knee and to have suspicious skin lesions removed with dermatology. He will keep scheduled follow up with Dr. Bobby Rumpf ?  ?  ?  ? ?The patient understands the plans discussed today and is in agreement with them.  He knows to contact our office if he develops  concerns prior to his next appointment. ? ? ? ?Melodye Ped, NP  ?[facility] [place] [address] ?[place] [address] ?Dept: [phone] ?Dept Fax: [phone]  ? ?Orders Placed This Encounter  ?Procedures  ? CBC and differential  ?  This external order was created through the Results Console.  ? CBC  ?  This external order was created through the Results Console.  ? CBC and differential  ?  This external order was created through the Results Console.  ? Basic metabolic panel  ?  This external order was created through the Results Console.  ? Comprehensive metabolic panel  ?  This external order was created through the Results Console.  ? Hepatic function panel  ?  This external order was created through the Results Console.  ?  ? ? ?CHIEF COMPLAINT:  ?CC: An 73- year old male with history of follicular lymphoma here for 2 week evaluation  ? ?Current Treatment:  Revlimid/ Rituxan ? ?INTERVAL HISTORY:  ?Ihan is here today for repeat clinical assessment. He denies fevers or chills. He denies pain. His appetite is good. His weight has been stable. ? ?I have reviewed the past medical history, past surgical history, social history and family history with the patient and they are unchanged from previous note. ? ?ALLERGIES:  is allergic to gabapentin and lipitor [atorvastatin]. ? ?MEDICATIONS:  ?Current Outpatient Medications  ?Medication Sig Dispense Refill  ? aspirin EC 81 MG tablet Take 81 mg  by mouth daily. Swallow whole.    ? calcium & magnesium carbonates (MYLANTA) 311-232 MG per tablet Take 1 tablet by mouth 2 (two) times daily.    ? Cholecalciferol (VITAMIN D3 PO) Take 500 Units by mouth 2 (two) times daily.    ? COENZYME Q-10 PO Take 50 mg by mouth 2 (two) times daily.     ? DOCOSAHEXAENOIC ACID PO Take 1 g by mouth daily.     ? enalapril (VASOTEC) 10 MG tablet Take 10 mg by mouth daily.  0  ? famotidine (PEPCID) 20 MG tablet Take 20 mg by  mouth 2 (two) times daily.    ? famotidine (PEPCID) 40 MG tablet Take 40 mg by mouth at bedtime.    ? fish oil-omega-3 fatty acids 1000 MG capsule Take 1 g by mouth daily.    ? glucosamine-chondroitin 500-400 MG tablet Take 1 tablet by mouth 2 (two) times daily.     ? hydrOXYzine (VISTARIL) 25 MG capsule Take 1 capsule (25 mg total) by mouth 3 (three) times daily as needed. 30 capsule 0  ? lenalidomide (REVLIMID) 10 MG capsule Take 2 pills (20 mg) by mouth daily, Take every 3 out of 4 weeks. 42 capsule 11  ? neomycin-polymyxin b-dexamethasone (MAXITROL) 3.5-10000-0.1 SUSP Place 1 drop into the right eye 4 (four) times daily.    ? ondansetron (ZOFRAN) 4 MG tablet Take 1 tablet (4 mg total) by mouth every 4 (four) hours as needed for nausea. 90 tablet 3  ? pravastatin (PRAVACHOL) 80 MG tablet Take 80 mg by mouth daily.   1  ? prochlorperazine (COMPAZINE) 10 MG tablet Take 1 tablet (10 mg total) by mouth every 6 (six) hours as needed for nausea or vomiting. 90 tablet 3  ? saw palmetto 500 MG capsule Take 500 mg by mouth 2 (two) times daily.    ? vitamin B-12 (CYANOCOBALAMIN) 1000 MCG tablet Take 1,000 mcg by mouth daily.     ? ?Current Facility-Administered Medications  ?Medication Dose Route Frequency Provider Last Rate Last Admin  ? triamcinolone acetonide (KENALOG-40) injection 20 mg  20 mg Other Once Landis Martins, DPM      ? ?Facility-Administered Medications Ordered in Other Visits  ?Medication Dose Route Frequency Provider Last Rate Last Admin  ? sodium chloride flush (NS) 0.9 % injection 10 mL  10 mL Intracatheter PRN Lewis, Dequincy A, MD   10 mL at 05/01/20 1210  ? sodium chloride flush (NS) 0.9 % injection 10 mL  10 mL Intracatheter PRN Lewis, Dequincy A, MD   10 mL at 08/27/21 1447  ? ? ?HISTORY OF PRESENT ILLNESS:  ? ?Oncology History  ?Follicular lymphoma ([place])  ?07/29/2011 Initial Diagnosis  ? Follicular lymphoma ([place]) ? ?  ?10/02/2021 -  Chemotherapy  ? Patient is on Treatment Plan : LYMPHOMA  Lenalidomide + Rituximab q28d  ? ?  ?  ?  ? ? ?REVIEW OF SYSTEMS:  ? ?Constitutional: Denies fevers, chills or abnormal weight loss ?Eyes: Denies blurriness of vision ?Ears, nose, mouth, throat, and face: Denies mucositis or sore throat ?Respiratory: Denies cough, dyspnea or wheezes ?Cardiovascular: Denies palpitation, chest discomfort or lower extremity swelling ?Gastrointestinal:  Denies nausea, heartburn or change in bowel habits ?Skin: Denies abnormal skin rashes ?Lymphatics: Denies new lymphadenopathy or easy bruising ?Neurological:Denies numbness, tingling or new weaknesses ?Behavioral/Psych: Mood is stable, no new changes  ?All other systems were reviewed with the patient and are negative. ? ? ?VITALS:  ?Blood pressure (!) 153/68, pulse 61, temperature  97.9 ?F (36.6 ?C), temperature source Oral, resp. rate 20, height '5\' 9"'$  (1.753 m), weight 202 lb (91.6 kg), SpO2 97 %.  ?Wt Readings from Last 3 Encounters:  ?10/21/21 202 lb (91.6 kg)  ?10/16/21 197 lb 8 oz (89.6 kg)  ?10/02/21 197 lb (89.4 kg)  ?  ?Body mass index is 29.83 kg/m?. ? ?Performance status (ECOG): 1 - Symptomatic but completely ambulatory ? ?PHYSICAL EXAM:  ? ?GENERAL:alert, no distress and comfortable ?SKIN: skin color, texture, turgor are normal, no rashes or significant lesions ?EYES: normal, Conjunctiva are pink and non-injected, sclera clear ?OROPHARYNX:no exudate, no erythema and lips, buccal mucosa, and tongue normal  ?NECK: supple, thyroid normal size, non-tender, without nodularity ?LYMPH:  no palpable lymphadenopathy in the cervical, axillary or inguinal ?LUNGS: clear to auscultation and percussion with normal breathing effort ?HEART: regular rate & rhythm and no murmurs and no lower extremity edema ?ABDOMEN:abdomen soft, non-tender and normal bowel sounds ?Musculoskeletal:no cyanosis of digits and no clubbing  ?NEURO: alert & oriented x 3 with fluent speech, no focal motor/sensory deficits ? ?LABORATORY DATA:  ?I have reviewed the data  as listed ?   ?Component Value Date/Time  ? NA 140 10/21/2021 0000  ? NA 140 12/31/2015 0937  ? K 4.0 10/21/2021 0000  ? K 4.4 12/31/2015 0937  ? CL 100 10/21/2021 0000  ? CL 104 09/23/2012 1104  ? CO2 35 (A) 05/02/202

## 2021-10-23 ENCOUNTER — Inpatient Hospital Stay: Payer: PPO

## 2021-10-23 ENCOUNTER — Encounter: Payer: Self-pay | Admitting: Oncology

## 2021-10-23 VITALS — BP 136/79 | HR 77 | Temp 98.3°F | Resp 20 | Ht 69.0 in | Wt 197.0 lb

## 2021-10-23 DIAGNOSIS — C8298 Follicular lymphoma, unspecified, lymph nodes of multiple sites: Secondary | ICD-10-CM

## 2021-10-23 DIAGNOSIS — C8291 Follicular lymphoma, unspecified, lymph nodes of head, face, and neck: Secondary | ICD-10-CM | POA: Diagnosis not present

## 2021-10-23 DIAGNOSIS — Z5112 Encounter for antineoplastic immunotherapy: Secondary | ICD-10-CM | POA: Diagnosis not present

## 2021-10-23 DIAGNOSIS — Z79899 Other long term (current) drug therapy: Secondary | ICD-10-CM | POA: Diagnosis not present

## 2021-10-23 MED ORDER — SODIUM CHLORIDE 0.9% FLUSH
10.0000 mL | INTRAVENOUS | Status: DC | PRN
  Administered 2021-10-23: 10 mL

## 2021-10-23 MED ORDER — DIPHENHYDRAMINE HCL 25 MG PO CAPS
50.0000 mg | ORAL_CAPSULE | Freq: Once | ORAL | Status: AC
  Administered 2021-10-23: 50 mg via ORAL
  Filled 2021-10-23: qty 2

## 2021-10-23 MED ORDER — SODIUM CHLORIDE 0.9 % IV SOLN
375.0000 mg/m2 | Freq: Once | INTRAVENOUS | Status: AC
  Administered 2021-10-23: 800 mg via INTRAVENOUS
  Filled 2021-10-23: qty 50

## 2021-10-23 MED ORDER — ACETAMINOPHEN 325 MG PO TABS
650.0000 mg | ORAL_TABLET | Freq: Once | ORAL | Status: AC
  Administered 2021-10-23: 650 mg via ORAL
  Filled 2021-10-23: qty 2

## 2021-10-23 MED ORDER — HEPARIN SOD (PORK) LOCK FLUSH 100 UNIT/ML IV SOLN
500.0000 [IU] | Freq: Once | INTRAVENOUS | Status: AC | PRN
  Administered 2021-10-23: 500 [IU]

## 2021-10-23 MED ORDER — SODIUM CHLORIDE 0.9 % IV SOLN: Freq: Once | INTRAVENOUS | Status: AC

## 2021-10-23 NOTE — Patient Instructions (Signed)
Rituximab Injection What is this medication? RITUXIMAB (ri TUX i mab) is a monoclonal antibody. It is used to treat certain types of cancer like non-Hodgkin lymphoma and chronic lymphocytic leukemia. It is also used to treat rheumatoid arthritis, granulomatosis with polyangiitis, microscopic polyangiitis, and pemphigus vulgaris. This medicine may be used for other purposes; ask your health care provider or pharmacist if you have questions. COMMON BRAND NAME(S): RIABNI, Rituxan, RUXIENCE, truxima What should I tell my care team before I take this medication? They need to know if you have any of these conditions: chest pain heart disease infection especially a viral infection such as chickenpox, cold sores, hepatitis B, or herpes immune system problems irregular heartbeat or rhythm kidney disease low blood counts (white cells, platelets, or red cells) lung disease recent or upcoming vaccine an unusual or allergic reaction to rituximab, other medicines, foods, dyes, or preservatives pregnant or trying to get pregnant breast-feeding How should I use this medication? This medicine is injected into a vein. It is given by a health care provider in a hospital or clinic setting. A special MedGuide will be given to you before each treatment. Be sure to read this information carefully each time. Talk to your health care provider about the use of this medicine in children. While this drug may be prescribed for children as young as 6 months for selected conditions, precautions do apply. Overdosage: If you think you have taken too much of this medicine contact a poison control center or emergency room at once. NOTE: This medicine is only for you. Do not share this medicine with others. What if I miss a dose? Keep appointments for follow-up doses. It is important not to miss your dose. Call your health care provider if you are unable to keep an appointment. What may interact with this medication? Do not  take this medicine with any of the following medicines: live vaccines This medicine may also interact with the following medicines: cisplatin This list may not describe all possible interactions. Give your health care provider a list of all the medicines, herbs, non-prescription drugs, or dietary supplements you use. Also tell them if you smoke, drink alcohol, or use illegal drugs. Some items may interact with your medicine. What should I watch for while using this medication? Your condition will be monitored carefully while you are receiving this medicine. You may need blood work done while you are taking this medicine. This medicine can cause serious infusion reactions. To reduce the risk your health care provider may give you other medicines to take before receiving this one. Be sure to follow the directions from your health care provider. This medicine may increase your risk of getting an infection. Call your health care provider for advice if you get a fever, chills, sore throat, or other symptoms of a cold or flu. Do not treat yourself. Try to avoid being around people who are sick. Call your health care provider if you are around anyone with measles, chickenpox, or if you develop sores or blisters that do not heal properly. Avoid taking medicines that contain aspirin, acetaminophen, ibuprofen, naproxen, or ketoprofen unless instructed by your health care provider. These medicines may hide a fever. This medicine may cause serious skin reactions. They can happen weeks to months after starting the medicine. Contact your health care provider right away if you notice fevers or flu-like symptoms with a rash. The rash may be red or purple and then turn into blisters or peeling of the skin. Or, you   might notice a red rash with swelling of the face, lips or lymph nodes in your neck or under your arms. In some patients, this medicine may cause a serious brain infection that may cause death. If you have any  problems seeing, thinking, speaking, walking, or standing, tell your healthcare professional right away. If you cannot reach your healthcare professional, urgently seek other source of medical care. Do not become pregnant while taking this medicine or for at least 12 months after stopping it. Women should inform their health care provider if they wish to become pregnant or think they might be pregnant. There is potential for serious harm to an unborn child. Talk to your health care provider for more information. Women should use a reliable form of birth control while taking this medicine and for 12 months after stopping it. Do not breast-feed while taking this medicine or for at least 6 months after stopping it. What side effects may I notice from receiving this medication? Side effects that you should report to your health care provider as soon as possible: allergic reactions (skin rash, itching or hives; swelling of the face, lips, or tongue) diarrhea edema (sudden weight gain; swelling of the ankles, feet, hands or other unusual swelling; trouble breathing) fast, irregular heartbeat heart attack (trouble breathing; pain or tightness in the chest, neck, back or arms; unusually weak or tired) infection (fever, chills, cough, sore throat, pain or trouble passing urine) kidney injury (trouble passing urine or change in the amount of urine) liver injury (dark yellow or brown urine; general ill feeling or flu-like symptoms; loss of appetite, right upper belly pain; unusually weak or tired, yellowing of the eyes or skin) low blood pressure (dizziness; feeling faint or lightheaded, falls; unusually weak or tired) low red blood cell counts (trouble breathing; feeling faint; lightheaded, falls; unusually weak or tired) mouth sores redness, blistering, peeling, or loosening of the skin, including inside the mouth stomach pain unusual bruising or bleeding wheezing (trouble breathing with loud or whistling  sounds) vomiting Side effects that usually do not require medical attention (report to your health care provider if they continue or are bothersome): headache joint pain muscle cramps, pain nausea This list may not describe all possible side effects. Call your doctor for medical advice about side effects. You may report side effects to FDA at 1-800-FDA-1088. Where should I keep my medication? This medicine is given in a hospital or clinic. It will not be stored at home. NOTE: This sheet is a summary. It may not cover all possible information. If you have questions about this medicine, talk to your doctor, pharmacist, or health care provider.  2023 Elsevier/Gold Standard (2020-06-10 00:00:00)  

## 2021-10-26 NOTE — Progress Notes (Deleted)
[facility]: ?  ? ?[name], MD ?[address] ?[place] [address] ? ? ?DIAGNOSIS: Cancer Staging  ?No matching staging information was found for the patient. ? ?SUMMARY OF ONCOLOGIC HISTORY: ?Oncology History  ?Follicular lymphoma ([place])  ?07/29/2011 Initial Diagnosis  ? Follicular lymphoma ([place]) ? ?  ?10/02/2021 -  Chemotherapy  ? Patient is on Treatment Plan : LYMPHOMA Lenalidomide + Rituximab q28d  ? ?  ?  ? ? ?CURRENT THERAPY: ? ?INTERVAL HISTORY: ?Sean Hodges 81 y.o. male returns for  ? ? ?Patient Active Problem List  ? Diagnosis Date Noted  ?? Port-A-Cath in place 05/01/2020  ?? Arthritis 07/03/2016  ?? Benign prostatic hyperplasia 07/03/2016  ?? Cancer ([place]) 07/03/2016  ?? DDD (degenerative disc disease), lumbar 07/03/2016  ?? Disease of thyroid gland 07/03/2016  ?? GERD (gastroesophageal reflux disease) 07/03/2016  ?? Lumbar stenosis 07/03/2016  ?? Spondylosis of lumbar joint 07/03/2016  ?? Port catheter in place 10/29/2015  ?? Nonischemic cardiomyopathy ([place]) 03/07/2015  ?? S/P left knee arthroscopy 11/26/2014  ?? Loose body of left knee 11/20/2014  ?? Left knee pain 11/15/2014  ?? Dyspnea on exertion 08/30/2014  ?? Precordial chest pain 08/30/2014  ?? Hypertension   ?? Hyperlipidemia   ?? Neuropathy due to chemotherapeutic drug ([place]) 09/17/2013  ?? Follicular lymphoma ([place]) 07/29/2011  ? ? ?is allergic to gabapentin and lipitor [atorvastatin]. ? ?MEDICAL HISTORY: ?Past Medical History:  ?Diagnosis Date  ?? Arthritis   ? knee osteoarthritis  ?? Cancer [facility])   ? nhl  ?? Hypercholesterolemia   ?? Hypertension   ?? Lymphoma ([place])   ?? Nonischemic cardiomyopathy ([place]) 03/07/2015  ? ? ?SURGICAL HISTORY: ?Past Surgical History:  ?Procedure Laterality Date  ?? arthroscopies Bilateral   ?? Left eye surgery  2013  ?? THYROIDECTOMY, PARTIAL Left   ?? vocal cord    ? polyps removed  ? ? ?SOCIAL HISTORY: ?Social History  ? ?Socioeconomic History  ?? Marital status:  Married  ?  Spouse name: Not on file  ?? Number of children: 3  ?? Years of education: Not on file  ?? Highest education level: Not on file  ?Occupational History  ?? Occupation: retired-engineer  ?Tobacco Use  ?? Smoking status: Former  ?  Types: Cigarettes  ?  Quit date: 01/20/1974  ?  Years since quitting: 47.7  ?? Smokeless tobacco: Former  ?  Quit date: 06/22/1973  ?Vaping Use  ?? Vaping Use: Never used  ?Substance and Sexual Activity  ?? Alcohol use: Never  ?? Drug use: Never  ?? Sexual activity: Not on file  ?Other Topics Concern  ?? Not on file  ?Social History Narrative  ? 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 3 children [place] in [place], 2 in [place]  ? ?Social Determinants of Health  ? ?Financial Resource Strain: Not on file  ?Food Insecurity: Not on file  ?Transportation Needs: Not on file  ?Physical Activity: Not on file  ?Stress: Not on file  ?Social Connections: Not on file  ?Intimate Partner Violence: Not on file  ? ? ?FAMILY HISTORY: ?Family History  ?Problem Relation Age of Onset  ?? Cancer Brother   ?? Cancer Brother   ?? Cancer Brother   ?? Cancer Brother   ?? Cancer Brother   ? ? ?Review of Systems - Oncology  ? ? ?  PHYSICAL EXAMINATION ? ?ECOG PERFORMANCE STATUS: {CHL ONC ECOG ER:[id number]} ? ?There were no vitals filed for this visit. ? ?Physical Exam ? ?LABORATORY DATA: ? ?CBC ?   ?Component Value Date/Time  ? WBC 7.1 10/21/2021 0000  ? WBC 13.0 (H) 12/31/2015 5631  ? WBC 6.7 02/28/2009 1237  ? RBC 4.88 10/21/2021 0000  ? HGB 13.7 10/21/2021 0000  ? HGB 13.9 12/31/2015 0937  ? HCT 42 10/21/2021 0000  ? HCT 42.0 12/31/2015 0937  ? PLT 108 (A) 10/21/2021 0000  ? PLT 180 12/31/2015 0937  ? MCV 84.6 12/31/2015 0937  ? MCH 28.0 12/31/2015 0937  ? MCHC 33.1 12/31/2015 0937  ? MCHC 34.3 02/28/2009 1237  ? RDW 14.6 12/31/2015 0937  ? LYMPHSABS 1.7 12/31/2015 0937  ? MONOABS 1.1 (H)  12/31/2015 4970  ? EOSABS 0.0 12/31/2015 0937  ? BASOSABS 0.0 12/31/2015 0937  ? ? ?CMP  ?   ?Component Value Date/Time  ? NA 140 10/21/2021 0000  ? NA 140 12/31/2015 0937  ? K 4.0 10/21/2021 0000  ? K 4.4 12/31/2015 0937  ? CL 100 10/21/2021 0000  ? CL 104 09/23/2012 1104  ? CO2 35 (A) 10/21/2021 0000  ? CO2 24 12/31/2015 0937  ? GLUCOSE 108 12/31/2015 0937  ? GLUCOSE 95 09/23/2012 1104  ? BUN 18 10/21/2021 0000  ? BUN 23.6 12/31/2015 0937  ? CREATININE 1.1 10/21/2021 0000  ? CREATININE 1.0 12/31/2015 0937  ? CALCIUM 9.0 10/21/2021 0000  ? CALCIUM 9.2 12/31/2015 0937  ? PROT 6.6 12/31/2015 0937  ? ALBUMIN 4.1 10/21/2021 0000  ? ALBUMIN 3.8 12/31/2015 0937  ? AST 17 10/21/2021 0000  ? AST 8 12/31/2015 0937  ? ALT 25 10/21/2021 0000  ? ALT 10 12/31/2015 0937  ? ALKPHOS 45 10/21/2021 0000  ? ALKPHOS 49 12/31/2015 0937  ? BILITOT 0.45 12/31/2015 0937  ? GFRNONAA >60 02/28/2009 1237  ? GFRAA  02/28/2009 1237  ?  >60        ?The eGFR has been calculated ?using the MDRD equation. ?This calculation has not been ?validated in all clinical ?situations. ?eGFR's persistently ?<60 mL/min signify ?possible Chronic Kidney Disease.  ? ? ? ? ? ?PENDING LABS: ? ? ?RADIOGRAPHIC STUDIES: ? ?No results found. ? ? ?PATHOLOGY: ? ? ? ? ?ASSESSMENT and THERAPY PLAN:  ? ?No problem-specific Assessment & Plan notes found for this encounter. ? ? ?No orders of the defined types were placed in this encounter. ? ? ?All questions were answered. The patient knows to call the clinic with any problems, questions or concerns. We can certainly see the patient much sooner if necessary. ?This note was electronically signed. ?Marice Potter, MD ?10/26/2021 ?

## 2021-10-26 NOTE — Progress Notes (Signed)
?[facility]  ?[address] ?[place],  [place]  [address] ?[phone] ? ?Clinic Day:  10/27/2021 ? ?Referring physician: [name], MD ? ?HISTORY OF PRESENT ILLNESS:  ?The patient is an 81 y.o. male with recurrent low grade follicular lymphoma.  There is also history of diffuse large B cell lymphoma. The patient developed a new focus of biopsy-proven low-grade follicular lymphoma involving his right eyelid.  Due to a PET scan showing multiple nodal areas consistent with disease recurrence, the patient was recently placed on Revlimid/Rituxan.  He comes in today to be evaluated before heading into his second cycle of this regimen.  Overall, the patient is extremely pleased as his right lower eyelid fullness has significantly decreased in size.  He attributes this to his combination regimen.  One symptom he has had recently is night sweats, which have begun since his Revlimid/Rituxan started.  He also has noticed intermittent leg cramps.  Overall, he continues to function well on a daily basis.  He denies having any obvious symptoms or findings which are concerning for overt signs of disease progression. ? ?With respect to his lymphoma history, his diffuse large B-cell lymphoma was initially diagnosed in May 2000 per biopsy of a mass in his left thyroid.  As CT scans showed evidence of disease above and below his diaphragm, he was given 6 cycles of CHOP chemotherapy, followed by weekly Rituxan for 8 treatments.  His disease did well up until August 2008 when CT and PET scans showed evidence of disease recurrence.  At that time, a repeat biopsy showed follicular lymphoma, which led to him undergoing 6 cycles of Rituxan-CVP.  The plan was to continue maintenance Rituxan for 2 years, but the patient only received 1 cycle of maintenance Rituxan before discontinuing it in early 2009.  He has not required any systemic therapy since then.  The patient has had biopsies and  resections of a fleshy skin nodule behind his right ear, whose pathology revealed follicular lymphoma, but with focal areas within it suggesting potential transformation into a more aggressive subtype.  However, this area never flared up to where intervention has been necessary.  He recently had problems with his right eyelid, for which a biopsy of the area confirmed the presence of recurrent low-grade follicular lymphoma.  This biopsy and worsening findings on a recent PET scan led to him being placed on Revlimid/Rituxan. ? ?PHYSICAL EXAM:  ?Blood pressure (!) 148/67, pulse 65, temperature 98.4 ?F (36.9 ?C), resp. rate 18, height '5\' 9"'$  (1.753 m), weight 201 lb 3.2 oz (91.3 kg), SpO2 97 %. ?Wt Readings from Last 3 Encounters:  ?10/27/21 201 lb 3.2 oz (91.3 kg)  ?10/23/21 197 lb (89.4 kg)  ?10/21/21 202 lb (91.6 kg)  ? ?Body mass index is 29.71 kg/m?Marland Kitchen ?Performance status (ECOG): 1 ?Physical Exam ?Constitutional:   ?   Appearance: Normal appearance. He is not ill-appearing.  ?HENT:  ?   Mouth/Throat:  ?   Mouth: Mucous membranes are moist.  ?   Pharynx: Oropharynx is clear. No oropharyngeal exudate or posterior oropharyngeal erythema.  ?Eyes:  ?   Comments: Very minimal thickening in the lower right eyelid consistent with positive treatment response.  ?Cardiovascular:  ?   Rate and Rhythm: Normal rate and regular rhythm.  ?   Heart sounds: No murmur heard. ?  No friction rub. No gallop.  ?Pulmonary:  ?   Effort: Pulmonary effort is normal. No respiratory distress.  ?  Breath sounds: Normal breath sounds. No wheezing, rhonchi or rales.  ?Abdominal:  ?   General: Bowel sounds are normal. There is no distension.  ?   Palpations: Abdomen is soft. There is no mass.  ?   Tenderness: There is no abdominal tenderness.  ?Musculoskeletal:     ?   General: No swelling.  ?   Right lower leg: No edema.  ?   Left lower leg: No edema.  ?Lymphadenopathy:  ?   Cervical: No cervical adenopathy.  ?   Upper Body:  ?   Right upper body:  No supraclavicular or axillary adenopathy.  ?   Left upper body: No supraclavicular or axillary adenopathy.  ?   Lower Body: No right inguinal adenopathy. No left inguinal adenopathy.  ?Skin: ?   General: Skin is warm.  ?   Coloration: Skin is not jaundiced.  ?   Findings: No lesion or rash.  ?Neurological:  ?   General: No focal deficit present.  ?   Mental Status: He is alert and oriented to person, place, and time. Mental status is at baseline.  ?Psychiatric:     ?   Mood and Affect: Mood normal.     ?   Behavior: Behavior normal.     ?   Thought Content: Thought content normal.  ?LABS: ? Latest Reference Range & Units 10/27/21 00:00  ?WBC  5.3 (E)  ?RBC 3.87 - 5.11  4.7 (E)  ?Hemoglobin 13.5 - 17.5  13.0 ! (E)  ?HCT 41 - 53  40 ! (E)  ?Platelets 150 - 400 K/uL 129 ! (E)  ?NEUT#  2.49 (E)  ?!: Data is abnormal ?(E): External lab result ? Latest Reference Range & Units 10/27/21 00:00  ?Sodium 137 - 147  137 (E)  ?Potassium 3.5 - 5.1 mEq/L 4.2 (E)  ?Chloride 99 - 108  102 (E)  ?CO2 13 - 22  31 ! (E)  ?Glucose  98 (E)  ?BUN 4 - 21  22 ! (E)  ?Creatinine 0.6 - 1.3  1.3 (E)  ?Calcium 8.7 - 10.7  8.5 ! (E)  ?Alkaline Phosphatase 25 - 125  51 (E)  ?Albumin 3.5 - 5.0  3.8 (E)  ?AST 14 - 40  16 (E)  ?ALT 10 - 40 U/L 21 (E)  ?Bilirubin, Total  0.7 (E)  ?!: Data is abnormal ?(E): External lab result ? ?ASSESSMENT & PLAN:  ?Assessment/Plan:  An 81 y.o. male with biopsy-proven recurrence of low-grade follicular lymphoma.  He also has a history of diffuse large B-cell lymphoma.   He will proceed with his second cycle of Revlimid/Rituxan this week.  Overall, I am very pleased as his white lower eyelid has essentially returned to normal.  This already reflects early signs of a positive response from the systemic therapy.  Clinically, the patient appears to be doing well.  I will see him back in 4 weeks before he is into his third cycle of Revlimid/Rituxan.  The patient understands all the plans discussed today and is in  agreement with them.   ? ? ?Sean Doyle Macarthur Critchley, MD ? ? ? ?  ?

## 2021-10-27 ENCOUNTER — Inpatient Hospital Stay (INDEPENDENT_AMBULATORY_CARE_PROVIDER_SITE_OTHER): Payer: PPO | Admitting: Oncology

## 2021-10-27 ENCOUNTER — Other Ambulatory Visit: Payer: Self-pay | Admitting: Hematology and Oncology

## 2021-10-27 ENCOUNTER — Inpatient Hospital Stay: Payer: PPO

## 2021-10-27 ENCOUNTER — Other Ambulatory Visit: Payer: Self-pay | Admitting: Oncology

## 2021-10-27 ENCOUNTER — Telehealth: Payer: Self-pay

## 2021-10-27 VITALS — BP 148/67 | HR 65 | Temp 98.4°F | Resp 18 | Ht 69.0 in | Wt 201.2 lb

## 2021-10-27 DIAGNOSIS — C8268 Cutaneous follicle center lymphoma, lymph nodes of multiple sites: Secondary | ICD-10-CM

## 2021-10-27 DIAGNOSIS — C8298 Follicular lymphoma, unspecified, lymph nodes of multiple sites: Secondary | ICD-10-CM

## 2021-10-27 DIAGNOSIS — C8290 Follicular lymphoma, unspecified, unspecified site: Secondary | ICD-10-CM | POA: Diagnosis not present

## 2021-10-27 LAB — BASIC METABOLIC PANEL
BUN: 22 — AB (ref 4–21)
CO2: 31 — AB (ref 13–22)
Chloride: 102 (ref 99–108)
Creatinine: 1.3 (ref 0.6–1.3)
Glucose: 98
Potassium: 4.2 mEq/L (ref 3.5–5.1)
Sodium: 137 (ref 137–147)

## 2021-10-27 LAB — HEPATIC FUNCTION PANEL
ALT: 21 U/L (ref 10–40)
AST: 16 (ref 14–40)
Alkaline Phosphatase: 51 (ref 25–125)
Bilirubin, Total: 0.7

## 2021-10-27 LAB — CBC AND DIFFERENTIAL
HCT: 40 — AB (ref 41–53)
Hemoglobin: 13 — AB (ref 13.5–17.5)
Neutrophils Absolute: 2.49
Platelets: 129 10*3/uL — AB (ref 150–400)
WBC: 5.3

## 2021-10-27 LAB — COMPREHENSIVE METABOLIC PANEL
Albumin: 3.8 (ref 3.5–5.0)
Calcium: 8.5 — AB (ref 8.7–10.7)

## 2021-10-27 LAB — CBC: RBC: 4.7 (ref 3.87–5.11)

## 2021-10-27 MED ORDER — LENALIDOMIDE 10 MG PO CAPS: ORAL_CAPSULE | ORAL | 11 refills | Status: DC

## 2021-10-27 NOTE — Telephone Encounter (Signed)
Pt was in clinic for labs and to see Dr Bobby Rumpf this morning. I spoke to pt while he was waiting for Dr Bobby Rumpf to enter room. Pt took the 21st dose of Revlimid last evening. He takes the Revlimid @ 8pm nightly after supper. He denies missed doses. He has intermittent nausea, but has only taken 2 nausea pills since starting the Revlimid. No emesis. No diarrhea, abdominal pain, fevers. He does report fatigue, daily leg cramps (calf down per pt), night sweats, itchy scalp, and constipation. He was given medication for the itchy scalp and it states it really helps. I do not see any rash on his scalp. I reviewed the use of senna 1-2 tab po BID with pt and his wife. I instructed him to take them daily, unless diarrhea. I also wrote the instructions down on a sheet of paper for them. Dr Bobby Rumpf came into room for office visit. He was happy to see that pt's eye looked much better since last time he saw him. He feels the Rituxan and Revlimid are definitely working. I notified Dr Bobby Rumpf of night sweats, itchy scalp,  and muscle cramps. He discussed possible reasons with pt and pt's wife. Pt aware to call us if he develops temp of 100.4 or higher day or night. ?

## 2021-10-29 ENCOUNTER — Encounter: Payer: Self-pay | Admitting: Oncology

## 2021-10-30 ENCOUNTER — Inpatient Hospital Stay: Payer: PPO

## 2021-10-30 VITALS — BP 119/77 | HR 57 | Temp 98.1°F | Resp 18

## 2021-10-30 DIAGNOSIS — C8298 Follicular lymphoma, unspecified, lymph nodes of multiple sites: Secondary | ICD-10-CM

## 2021-10-30 DIAGNOSIS — Z5112 Encounter for antineoplastic immunotherapy: Secondary | ICD-10-CM | POA: Diagnosis not present

## 2021-10-30 MED ORDER — DIPHENHYDRAMINE HCL 25 MG PO CAPS
50.0000 mg | ORAL_CAPSULE | Freq: Once | ORAL | Status: AC
  Administered 2021-10-30: 50 mg via ORAL
  Filled 2021-10-30: qty 2

## 2021-10-30 MED ORDER — SODIUM CHLORIDE 0.9% FLUSH
10.0000 mL | INTRAVENOUS | Status: DC | PRN
  Administered 2021-10-30: 10 mL

## 2021-10-30 MED ORDER — SODIUM CHLORIDE 0.9 % IV SOLN
375.0000 mg/m2 | Freq: Once | INTRAVENOUS | Status: AC
  Administered 2021-10-30: 800 mg via INTRAVENOUS
  Filled 2021-10-30: qty 50

## 2021-10-30 MED ORDER — ACETAMINOPHEN 325 MG PO TABS
650.0000 mg | ORAL_TABLET | Freq: Once | ORAL | Status: AC
  Administered 2021-10-30: 650 mg via ORAL
  Filled 2021-10-30: qty 2

## 2021-10-30 MED ORDER — HEPARIN SOD (PORK) LOCK FLUSH 100 UNIT/ML IV SOLN
500.0000 [IU] | Freq: Once | INTRAVENOUS | Status: AC | PRN
  Administered 2021-10-30: 500 [IU]

## 2021-10-30 MED ORDER — SODIUM CHLORIDE 0.9 % IV SOLN: Freq: Once | INTRAVENOUS | Status: AC

## 2021-10-30 NOTE — Patient Instructions (Signed)
[facility]  Discharge Instructions: ?Thank you for choosing [facility] to provide your oncology and hematology care.  ?If you have a lab appointment with the [facility], please go directly to the [facility] and check in at the registration area. ?  ?Wear comfortable clothing and clothing appropriate for easy access to any Portacath or PICC line.  ? ?We strive to give you quality time with your provider. You may need to reschedule your appointment if you arrive late (15 or more minutes).  Arriving late affects you and other patients whose appointments are after yours.  Also, if you miss three or more appointments without notifying the office, you may be dismissed from the clinic at the provider?s discretion.    ?  ?For prescription refill requests, have your pharmacy contact our office and allow 72 hours for refills to be completed.   ? ?Today you received the following chemotherapy and/or immunotherapy agents rituxan    ?  ?To help prevent nausea and vomiting after your treatment, we encourage you to take your nausea medication as directed. ? ?BELOW ARE SYMPTOMS THAT SHOULD BE REPORTED IMMEDIATELY: ?*FEVER GREATER THAN 100.4 F (38 ?C) OR HIGHER ?*CHILLS OR SWEATING ?*NAUSEA AND VOMITING THAT IS NOT CONTROLLED WITH YOUR NAUSEA MEDICATION ?*UNUSUAL SHORTNESS OF BREATH ?*UNUSUAL BRUISING OR BLEEDING ?*URINARY PROBLEMS (pain or burning when urinating, or frequent urination) ?*BOWEL PROBLEMS (unusual diarrhea, constipation, pain near the anus) ?TENDERNESS IN MOUTH AND THROAT WITH OR WITHOUT PRESENCE OF ULCERS (sore throat, sores in mouth, or a toothache) ?UNUSUAL RASH, SWELLING OR PAIN  ?UNUSUAL VAGINAL DISCHARGE OR ITCHING  ? ?Items with * indicate a potential emergency and should be followed up as soon as possible or go to the Emergency Department if any problems should occur. ? ?Please show the CHEMOTHERAPY ALERT CARD or IMMUNOTHERAPY ALERT CARD at check-in to the  Emergency Department and triage nurse. ? ?Should you have questions after your visit or need to cancel or reschedule your appointment, please contact [facility]  Dept: [phone]  and follow the prompts.  Office hours are 8:00 a.m. to 4:30 p.m. Monday - Friday. Please note that voicemails left after 4:00 p.m. may not be returned until the following business day.  We are closed weekends and major holidays. You have access to a nurse at all times for urgent questions. Please call the main number to the clinic Dept: [phone] and follow the prompts. ? ?For any non-urgent questions, you may also contact your provider using MyChart. We now offer e-Visits for anyone 66 and older to request care online for non-urgent symptoms. For details visit [email or web address]. ?  ?Also download the MyChart app! Go to the app store, search "MyChart", open the app, select [facility], and log in with your MyChart username and password. ? ?Due to Covid, a mask is required upon entering the hospital/clinic. If you do not have a mask, one will be given to you upon arrival. For doctor visits, patients may have 1 support person aged 65 or older with them. For treatment visits, patients cannot have anyone with them due to current Covid guidelines and our immunocompromised population.  ? ?Rituximab Injection ?What is this medication? ?RITUXIMAB (ri TUX i mab) is a monoclonal antibody. It is used to treat certain types of cancer like non-Hodgkin lymphoma and chronic lymphocytic leukemia. It is also used to treat rheumatoid arthritis, granulomatosis with polyangiitis, microscopic polyangiitis, and pemphigus vulgaris. ?This medicine may be  used for other purposes; ask your health care provider or pharmacist if you have questions. ?COMMON BRAND NAME(S): RIABNI, Rituxan, RUXIENCE, truxima ?What should I tell my care team before I take this medication? ?They need to know if you have any of these conditions: ?chest  pain ?heart disease ?infection especially a viral infection such as chickenpox, cold sores, hepatitis B, or herpes ?immune system problems ?irregular heartbeat or rhythm ?kidney disease ?low blood counts (white cells, platelets, or red cells) ?lung disease ?recent or upcoming vaccine ?an unusual or allergic reaction to rituximab, other medicines, foods, dyes, or preservatives ?pregnant or trying to get pregnant ?breast-feeding ?How should I use this medication? ?This medicine is injected into a vein. It is given by a health care provider in a hospital or clinic setting. ?A special MedGuide will be given to you before each treatment. Be sure to read this information carefully each time. ?Talk to your health care provider about the use of this medicine in children. While this drug may be prescribed for children as young as 6 months for selected conditions, precautions do apply. ?Overdosage: If you think you have taken too much of this medicine contact a poison control center or emergency room at once. ?NOTE: This medicine is only for you. Do not share this medicine with others. ?What if I miss a dose? ?Keep appointments for follow-up doses. It is important not to miss your dose. Call your health care provider if you are unable to keep an appointment. ?What may interact with this medication? ?Do not take this medicine with any of the following medicines: ?live vaccines ?This medicine may also interact with the following medicines: ?cisplatin ?This list may not describe all possible interactions. Give your health care provider a list of all the medicines, herbs, non-prescription drugs, or dietary supplements you use. Also tell them if you smoke, drink alcohol, or use illegal drugs. Some items may interact with your medicine. ?What should I watch for while using this medication? ?Your condition will be monitored carefully while you are receiving this medicine. You may need blood work done while you are taking this  medicine. ?This medicine can cause serious infusion reactions. To reduce the risk your health care provider may give you other medicines to take before receiving this one. Be sure to follow the directions from your health care provider. ?This medicine may increase your risk of getting an infection. Call your health care provider for advice if you get a fever, chills, sore throat, or other symptoms of a cold or flu. Do not treat yourself. Try to avoid being around people who are sick. ?Call your health care provider if you are around anyone with measles, chickenpox, or if you develop sores or blisters that do not heal properly. ?Avoid taking medicines that contain aspirin, acetaminophen, ibuprofen, naproxen, or ketoprofen unless instructed by your health care provider. These medicines may hide a fever. ?This medicine may cause serious skin reactions. They can happen weeks to months after starting the medicine. Contact your health care provider right away if you notice fevers or flu-like symptoms with a rash. The rash may be red or purple and then turn into blisters or peeling of the skin. Or, you might notice a red rash with swelling of the face, lips or lymph nodes in your neck or under your arms. ?In some patients, this medicine may cause a serious brain infection that may cause death. If you have any problems seeing, thinking, speaking, walking, or standing, tell your healthcare  professional right away. If you cannot reach your healthcare professional, urgently seek other source of medical care. ?Do not become pregnant while taking this medicine or for at least 12 months after stopping it. Women should inform their health care provider if they wish to become pregnant or think they might be pregnant. There is potential for serious harm to an unborn child. Talk to your health care provider for more information. Women should use a reliable form of birth control while taking this medicine and for 12 months after  stopping it. Do not breast-feed while taking this medicine or for at least 6 months after stopping it. ?What side effects may I notice from receiving this medication? ?Side effects that you should report to your healt

## 2021-11-04 DIAGNOSIS — M25562 Pain in left knee: Secondary | ICD-10-CM | POA: Diagnosis not present

## 2021-11-04 DIAGNOSIS — M1712 Unilateral primary osteoarthritis, left knee: Secondary | ICD-10-CM | POA: Diagnosis not present

## 2021-11-04 DIAGNOSIS — G8929 Other chronic pain: Secondary | ICD-10-CM | POA: Diagnosis not present

## 2021-11-05 ENCOUNTER — Telehealth: Payer: Self-pay

## 2021-11-05 NOTE — Telephone Encounter (Signed)
Oral Oncology Pharmacist Encounter ? ?Received message from RN regarding the patients medication that they are receiving from [facility] and being unable to reach anyone to schedule the next shipment for the past few days.  ? ?Waited on hold for [employer] and finally was able to get a representative and set up shipment for Mr. Nevares. Shipment should be at patients house on Friday afternoon delivery.  ? ?Patient notified of shipment and date and that he will need to be home to sign. This will be delivered through [employer].  ? ?Drema Halon, PharmD ?Hematology/Oncology Clinical Pharmacist ?[facility] ?[phone] ? ?

## 2021-11-12 ENCOUNTER — Telehealth: Payer: Self-pay

## 2021-11-12 ENCOUNTER — Other Ambulatory Visit: Payer: Self-pay | Admitting: Hematology and Oncology

## 2021-11-12 DIAGNOSIS — M79605 Pain in left leg: Secondary | ICD-10-CM | POA: Diagnosis not present

## 2021-11-12 DIAGNOSIS — M79662 Pain in left lower leg: Secondary | ICD-10-CM | POA: Diagnosis not present

## 2021-11-12 DIAGNOSIS — C8298 Follicular lymphoma, unspecified, lymph nodes of multiple sites: Secondary | ICD-10-CM

## 2021-11-12 NOTE — Telephone Encounter (Signed)
Melissa,NP, called pt last night (11/12/2021) and notified pt that the ultrasound was negative for blood clot.   Neva Seat, scheduling: Calling patient -Appt '@4pm'$    RE: Pt thinks he may have blood clot left inner thigh Received: Today Melodye Ped, NP  Dairl Ponder, RN We can send for ultrasound of the left lower extremity.           Pt calls to report, "I think I may have a blood clot on my inner thigh. The spot has been sore for about week". Pt describes it as "feels like a knot. It isn't warm, but maybe little discolored. The leg really doesn't feel swollen. I don't know if this matters or not, but I had blood clot at my port on left side in 2000. They had to remove my port because of swelling in my neck".  Area of concern is on left inner thigh between knee and hip. Message sent to [facility] @ 0912-awc.

## 2021-11-13 ENCOUNTER — Encounter: Payer: Self-pay | Admitting: Oncology

## 2021-11-13 ENCOUNTER — Encounter: Payer: Self-pay | Admitting: Hematology and Oncology

## 2021-11-13 ENCOUNTER — Telehealth: Payer: Self-pay

## 2021-11-13 NOTE — Telephone Encounter (Addendum)
I spoke with pt. He is taking his Revlimid @ 8pm, he eats approx.@ 7-730p. No missed doses. He denies N/V, abdominal pain, chest pain, SOB, diarrhea and fevers. He still reports having cramping in his lower legs intermittently (he has to get up out of the bed to walk around until they ease up). He reports that he has a lot of problems with his feet and legs since he had CHOP tx years ago. He doesn't like taking gabapentin, because of mental fogginess. He states that he & his [place] the mall for about 45 minutes almost daily. His scalp still itches @ times - he takes the Vistaril PRN. Night sweats continue, but not quite as bad. I reminded pt to call us if he develops temp of 100.4 or higher, day or night. He verbalized understanding. I confirmed next appt w/pt.

## 2021-11-14 ENCOUNTER — Encounter: Payer: Self-pay | Admitting: Oncology

## 2021-11-23 NOTE — Progress Notes (Signed)
[facility]  [address] [place],  [place]  [address] [phone]  Clinic Day:  11/24/2021  Referring physician: [name], MD  HISTORY OF PRESENT ILLNESS:  The patient is an 81 y.o. male with recurrent low grade follicular lymphoma.  There is also history of diffuse large B cell lymphoma. The patient developed a new focus of biopsy-proven low-grade follicular lymphoma involving his right eyelid.  Due to a PET scan showing multiple nodal areas consistent with disease recurrence, the patient was recently placed on Revlimid/Rituxan.  He comes in today to be evaluated before heading into his 3rd cycle of this regimen.  Overall, the patient is extremely pleased as his right lower eyelid fullness remains significantly decreased in size.  He attributes this to his combination regimen.  Despite his disease improvement and being on Revlimid/Rituxan, he continues to have night sweats.  Otherwise, he continues to function well on a daily basis.  He denies having any obvious symptoms or findings which are concerning for overt signs of disease progression.  With respect to his lymphoma history, his diffuse large B-cell lymphoma was initially diagnosed in May 2000 per biopsy of a mass in his left thyroid.  As CT scans showed evidence of disease above and below his diaphragm, he was given 6 cycles of CHOP chemotherapy, followed by weekly Rituxan for 8 treatments.  His disease did well up until August 2008 when CT and PET scans showed evidence of disease recurrence.  At that time, a repeat biopsy showed follicular lymphoma, which led to him undergoing 6 cycles of Rituxan-CVP.  The plan was to continue maintenance Rituxan for 2 years, but the patient only received 1 cycle of maintenance Rituxan before discontinuing it in early 2009.  He has not required any systemic therapy since then.  The patient has had biopsies and resections of a fleshy skin nodule behind his right ear,  whose pathology revealed follicular lymphoma, but with focal areas within it suggesting potential transformation into a more aggressive subtype.  However, this area never flared up to where intervention has been necessary.  He recently had problems with his right eyelid, for which a biopsy of the area confirmed the presence of recurrent low-grade follicular lymphoma.  This biopsy and worsening findings on a recent PET scan led to him being placed on Revlimid/Rituxan.  PHYSICAL EXAM:  Blood pressure (!) 153/67, pulse 65, temperature 98.1 F (36.7 C), resp. rate 16, height '5\' 9"'$  (1.753 m), weight 193 lb 9.6 oz (87.8 kg), SpO2 95 %. Wt Readings from Last 3 Encounters:  11/24/21 193 lb 9.6 oz (87.8 kg)  10/27/21 201 lb 3.2 oz (91.3 kg)  10/23/21 197 lb (89.4 kg)   Body mass index is 28.59 kg/m. Performance status (ECOG): 1 Physical Exam Constitutional:      Appearance: Normal appearance. He is not ill-appearing.  HENT:     Mouth/Throat:     Mouth: Mucous membranes are moist.     Pharynx: Oropharynx is clear. No oropharyngeal exudate or posterior oropharyngeal erythema.  Eyes:     Comments: Very minimal thickening in the lower right eyelid consistent with positive treatment response.  Cardiovascular:     Rate and Rhythm: Normal rate and regular rhythm.     Heart sounds: No murmur heard.   No friction rub. No gallop.  Pulmonary:     Effort: Pulmonary effort is normal. No respiratory distress.     Breath sounds: Normal breath sounds. No wheezing, rhonchi  or rales.  Abdominal:     General: Bowel sounds are normal. There is no distension.     Palpations: Abdomen is soft. There is no mass.     Tenderness: There is no abdominal tenderness.  Musculoskeletal:        General: No swelling.     Right lower leg: No edema.     Left lower leg: No edema.  Lymphadenopathy:     Cervical: No cervical adenopathy.     Upper Body:     Right upper body: No supraclavicular or axillary adenopathy.      Left upper body: No supraclavicular or axillary adenopathy.     Lower Body: No right inguinal adenopathy. No left inguinal adenopathy.  Skin:    General: Skin is warm.     Coloration: Skin is not jaundiced.     Findings: No lesion or rash.  Neurological:     General: No focal deficit present.     Mental Status: He is alert and oriented to person, place, and time. Mental status is at baseline.  Psychiatric:        Mood and Affect: Mood normal.        Behavior: Behavior normal.        Thought Content: Thought content normal.   LABS:  Latest Reference Range & Units 11/24/21 00:00 11/24/21 08:44  Sodium 137 - 147  139 (E)   Potassium 3.5 - 5.1 mEq/L 4.2 (E)   Chloride 99 - 108  100 (E)   CO2 13 - 22  30 ! (E)   Glucose  98 (E)   BUN 4 - 21  20 (E)   Creatinine 0.6 - 1.3  1.0 (E)   Calcium 8.7 - 10.7  9.0 (E)   Alkaline Phosphatase 25 - 125  54 (E)   Albumin 3.5 - 5.0  4.2 (E)   AST 14 - 40  19 (E)   ALT 10 - 40 U/L 37 (E)   Bilirubin, Total  0.9 (E)   LDH 98 - 192 U/L  139  WBC  8.6 (E)   RBC 3.87 - 5.11  5.36 ! (E)   Hemoglobin 13.5 - 17.5  14.6 (E)   HCT 41 - 53  45 (E)   Platelets 150 - 400 K/uL 91 ! (E)   !: Data is abnormal (E): External lab result  ASSESSMENT & PLAN:  Assessment/Plan:  An 81 y.o. male with biopsy-proven recurrence of low-grade follicular lymphoma.  He also has a history of diffuse large B-cell lymphoma.   He will proceed with his 3rd cycle of Revlimid/Rituxan this week.  Overall, I am very pleased that she has had a good response to treatment thus far.  Clinically, the patient appears to be doing well.  I will see him back in 4 weeks before he is into his 4th cycle of Revlimid/Rituxan.  A PET scan will be done before his next visit ascertain his new disease baseline after 3 cycles of Revlimid/Rituxan. The patient understands all the plans discussed today and is in agreement with them.     Keyasha Miah Macarthur Critchley, MD

## 2021-11-24 ENCOUNTER — Inpatient Hospital Stay: Payer: PPO

## 2021-11-24 ENCOUNTER — Other Ambulatory Visit: Payer: Self-pay | Admitting: Oncology

## 2021-11-24 ENCOUNTER — Inpatient Hospital Stay: Payer: PPO | Attending: Oncology | Admitting: Oncology

## 2021-11-24 VITALS — BP 153/67 | HR 65 | Temp 98.1°F | Resp 16 | Ht 69.0 in | Wt 193.6 lb

## 2021-11-24 DIAGNOSIS — C8291 Follicular lymphoma, unspecified, lymph nodes of head, face, and neck: Secondary | ICD-10-CM | POA: Diagnosis not present

## 2021-11-24 DIAGNOSIS — D649 Anemia, unspecified: Secondary | ICD-10-CM | POA: Diagnosis not present

## 2021-11-24 DIAGNOSIS — C8298 Follicular lymphoma, unspecified, lymph nodes of multiple sites: Secondary | ICD-10-CM

## 2021-11-24 DIAGNOSIS — Z79899 Other long term (current) drug therapy: Secondary | ICD-10-CM | POA: Insufficient documentation

## 2021-11-24 DIAGNOSIS — Z5112 Encounter for antineoplastic immunotherapy: Secondary | ICD-10-CM | POA: Insufficient documentation

## 2021-11-24 LAB — HEPATIC FUNCTION PANEL
ALT: 37 U/L (ref 10–40)
AST: 19 (ref 14–40)
Alkaline Phosphatase: 54 (ref 25–125)
Bilirubin, Total: 0.9

## 2021-11-24 LAB — CBC: RBC: 5.36 — AB (ref 3.87–5.11)

## 2021-11-24 LAB — BASIC METABOLIC PANEL
BUN: 20 (ref 4–21)
CO2: 30 — AB (ref 13–22)
Chloride: 100 (ref 99–108)
Creatinine: 1 (ref 0.6–1.3)
Glucose: 98
Potassium: 4.2 mEq/L (ref 3.5–5.1)
Sodium: 139 (ref 137–147)

## 2021-11-24 LAB — CBC AND DIFFERENTIAL
HCT: 45 (ref 41–53)
Hemoglobin: 14.6 (ref 13.5–17.5)
Neutrophils Absolute: 3.96
Platelets: 91 K/uL — AB (ref 150–400)
WBC: 8.6

## 2021-11-24 LAB — COMPREHENSIVE METABOLIC PANEL
Albumin: 4.2 (ref 3.5–5.0)
Calcium: 9 (ref 8.7–10.7)

## 2021-11-24 LAB — LACTATE DEHYDROGENASE: LDH: 139 U/L (ref 98–192)

## 2021-11-26 ENCOUNTER — Encounter: Payer: Self-pay | Admitting: Oncology

## 2021-11-26 MED FILL — Rituximab-pvvr IV Soln 500 MG/50ML (10 MG/ML): INTRAVENOUS | Qty: 80 | Status: AC

## 2021-11-26 NOTE — Progress Notes (Signed)
Ok to proceed with rituximab despite platelets = 91K per Dr. Bobby Rumpf

## 2021-11-27 ENCOUNTER — Inpatient Hospital Stay: Payer: PPO

## 2021-11-27 VITALS — BP 131/74 | HR 77 | Temp 98.1°F | Resp 18

## 2021-11-27 DIAGNOSIS — Z5112 Encounter for antineoplastic immunotherapy: Secondary | ICD-10-CM | POA: Diagnosis not present

## 2021-11-27 DIAGNOSIS — C8298 Follicular lymphoma, unspecified, lymph nodes of multiple sites: Secondary | ICD-10-CM

## 2021-11-27 MED ORDER — SODIUM CHLORIDE 0.9 % IV SOLN: Freq: Once | INTRAVENOUS | Status: AC

## 2021-11-27 MED ORDER — SODIUM CHLORIDE 0.9 % IV SOLN
375.0000 mg/m2 | Freq: Once | INTRAVENOUS | Status: AC
  Administered 2021-11-27: 800 mg via INTRAVENOUS
  Filled 2021-11-27: qty 50

## 2021-11-27 MED ORDER — DIPHENHYDRAMINE HCL 25 MG PO CAPS
50.0000 mg | ORAL_CAPSULE | Freq: Once | ORAL | Status: AC
  Administered 2021-11-27: 50 mg via ORAL
  Filled 2021-11-27: qty 2

## 2021-11-27 MED ORDER — HEPARIN SOD (PORK) LOCK FLUSH 100 UNIT/ML IV SOLN
500.0000 [IU] | Freq: Once | INTRAVENOUS | Status: AC | PRN
  Administered 2021-11-27: 500 [IU]

## 2021-11-27 MED ORDER — ACETAMINOPHEN 325 MG PO TABS
650.0000 mg | ORAL_TABLET | Freq: Once | ORAL | Status: AC
  Administered 2021-11-27: 650 mg via ORAL
  Filled 2021-11-27: qty 2

## 2021-11-27 MED ORDER — SODIUM CHLORIDE 0.9% FLUSH
10.0000 mL | INTRAVENOUS | Status: DC | PRN
  Administered 2021-11-27: 10 mL

## 2021-11-27 NOTE — Patient Instructions (Signed)
Rituximab Injection What is this medication? RITUXIMAB (ri TUX i mab) is a monoclonal antibody. It is used to treat certain types of cancer like non-Hodgkin lymphoma and chronic lymphocytic leukemia. It is also used to treat rheumatoid arthritis, granulomatosis with polyangiitis, microscopic polyangiitis, and pemphigus vulgaris. This medicine may be used for other purposes; ask your health care provider or pharmacist if you have questions. COMMON BRAND NAME(S): RIABNI, Rituxan, RUXIENCE, truxima What should I tell my care team before I take this medication? They need to know if you have any of these conditions: chest pain heart disease infection especially a viral infection such as chickenpox, cold sores, hepatitis B, or herpes immune system problems irregular heartbeat or rhythm kidney disease low blood counts (white cells, platelets, or red cells) lung disease recent or upcoming vaccine an unusual or allergic reaction to rituximab, other medicines, foods, dyes, or preservatives pregnant or trying to get pregnant breast-feeding How should I use this medication? This medicine is injected into a vein. It is given by a health care provider in a hospital or clinic setting. A special MedGuide will be given to you before each treatment. Be sure to read this information carefully each time. Talk to your health care provider about the use of this medicine in children. While this drug may be prescribed for children as young as 6 months for selected conditions, precautions do apply. Overdosage: If you think you have taken too much of this medicine contact a poison control center or emergency room at once. NOTE: This medicine is only for you. Do not share this medicine with others. What if I miss a dose? Keep appointments for follow-up doses. It is important not to miss your dose. Call your health care provider if you are unable to keep an appointment. What may interact with this medication? Do not  take this medicine with any of the following medicines: live vaccines This medicine may also interact with the following medicines: cisplatin This list may not describe all possible interactions. Give your health care provider a list of all the medicines, herbs, non-prescription drugs, or dietary supplements you use. Also tell them if you smoke, drink alcohol, or use illegal drugs. Some items may interact with your medicine. What should I watch for while using this medication? Your condition will be monitored carefully while you are receiving this medicine. You may need blood work done while you are taking this medicine. This medicine can cause serious infusion reactions. To reduce the risk your health care provider may give you other medicines to take before receiving this one. Be sure to follow the directions from your health care provider. This medicine may increase your risk of getting an infection. Call your health care provider for advice if you get a fever, chills, sore throat, or other symptoms of a cold or flu. Do not treat yourself. Try to avoid being around people who are sick. Call your health care provider if you are around anyone with measles, chickenpox, or if you develop sores or blisters that do not heal properly. Avoid taking medicines that contain aspirin, acetaminophen, ibuprofen, naproxen, or ketoprofen unless instructed by your health care provider. These medicines may hide a fever. This medicine may cause serious skin reactions. They can happen weeks to months after starting the medicine. Contact your health care provider right away if you notice fevers or flu-like symptoms with a rash. The rash may be red or purple and then turn into blisters or peeling of the skin. Or, you   might notice a red rash with swelling of the face, lips or lymph nodes in your neck or under your arms. In some patients, this medicine may cause a serious brain infection that may cause death. If you have any  problems seeing, thinking, speaking, walking, or standing, tell your healthcare professional right away. If you cannot reach your healthcare professional, urgently seek other source of medical care. Do not become pregnant while taking this medicine or for at least 12 months after stopping it. Women should inform their health care provider if they wish to become pregnant or think they might be pregnant. There is potential for serious harm to an unborn child. Talk to your health care provider for more information. Women should use a reliable form of birth control while taking this medicine and for 12 months after stopping it. Do not breast-feed while taking this medicine or for at least 6 months after stopping it. What side effects may I notice from receiving this medication? Side effects that you should report to your health care provider as soon as possible: allergic reactions (skin rash, itching or hives; swelling of the face, lips, or tongue) diarrhea edema (sudden weight gain; swelling of the ankles, feet, hands or other unusual swelling; trouble breathing) fast, irregular heartbeat heart attack (trouble breathing; pain or tightness in the chest, neck, back or arms; unusually weak or tired) infection (fever, chills, cough, sore throat, pain or trouble passing urine) kidney injury (trouble passing urine or change in the amount of urine) liver injury (dark yellow or brown urine; general ill feeling or flu-like symptoms; loss of appetite, right upper belly pain; unusually weak or tired, yellowing of the eyes or skin) low blood pressure (dizziness; feeling faint or lightheaded, falls; unusually weak or tired) low red blood cell counts (trouble breathing; feeling faint; lightheaded, falls; unusually weak or tired) mouth sores redness, blistering, peeling, or loosening of the skin, including inside the mouth stomach pain unusual bruising or bleeding wheezing (trouble breathing with loud or whistling  sounds) vomiting Side effects that usually do not require medical attention (report to your health care provider if they continue or are bothersome): headache joint pain muscle cramps, pain nausea This list may not describe all possible side effects. Call your doctor for medical advice about side effects. You may report side effects to FDA at 1-800-FDA-1088. Where should I keep my medication? This medicine is given in a hospital or clinic. It will not be stored at home. NOTE: This sheet is a summary. It may not cover all possible information. If you have questions about this medicine, talk to your doctor, pharmacist, or health care provider.  2023 Elsevier/Gold Standard (2020-06-10 00:00:00)  

## 2021-12-01 DIAGNOSIS — Z961 Presence of intraocular lens: Secondary | ICD-10-CM | POA: Diagnosis not present

## 2021-12-02 ENCOUNTER — Telehealth: Payer: Self-pay

## 2021-12-02 DIAGNOSIS — K219 Gastro-esophageal reflux disease without esophagitis: Secondary | ICD-10-CM | POA: Diagnosis not present

## 2021-12-02 DIAGNOSIS — Z87891 Personal history of nicotine dependence: Secondary | ICD-10-CM | POA: Diagnosis not present

## 2021-12-02 DIAGNOSIS — I10 Essential (primary) hypertension: Secondary | ICD-10-CM | POA: Diagnosis not present

## 2021-12-02 DIAGNOSIS — Z7960 Long term (current) use of unspecified immunomodulators and immunosuppressants: Secondary | ICD-10-CM | POA: Diagnosis not present

## 2021-12-02 DIAGNOSIS — Z888 Allergy status to other drugs, medicaments and biological substances status: Secondary | ICD-10-CM | POA: Diagnosis not present

## 2021-12-02 DIAGNOSIS — Z7901 Long term (current) use of anticoagulants: Secondary | ICD-10-CM | POA: Diagnosis not present

## 2021-12-02 DIAGNOSIS — R0781 Pleurodynia: Secondary | ICD-10-CM | POA: Diagnosis not present

## 2021-12-02 DIAGNOSIS — N2889 Other specified disorders of kidney and ureter: Secondary | ICD-10-CM | POA: Diagnosis not present

## 2021-12-02 DIAGNOSIS — I361 Nonrheumatic tricuspid (valve) insufficiency: Secondary | ICD-10-CM | POA: Diagnosis not present

## 2021-12-02 DIAGNOSIS — E7800 Pure hypercholesterolemia, unspecified: Secondary | ICD-10-CM | POA: Diagnosis not present

## 2021-12-02 DIAGNOSIS — M1990 Unspecified osteoarthritis, unspecified site: Secondary | ICD-10-CM | POA: Diagnosis not present

## 2021-12-02 DIAGNOSIS — C8291 Follicular lymphoma, unspecified, lymph nodes of head, face, and neck: Secondary | ICD-10-CM | POA: Diagnosis not present

## 2021-12-02 DIAGNOSIS — I2699 Other pulmonary embolism without acute cor pulmonale: Secondary | ICD-10-CM | POA: Diagnosis not present

## 2021-12-02 NOTE — Telephone Encounter (Signed)
See prior telephone encounter.

## 2021-12-02 NOTE — Telephone Encounter (Signed)
Patient called and complaint of back pain and chest pain but not chest pain per patient. Patient with history of precordial catch syndrome. No complaint of arm or jaw pain , voiced he was using tylenol and a heating pad. Patient ended phone call this morning because he voiced he had to take his wife to the doctor right at that very minute or they would be late. Tried to call patient back at cell number call directed to message voice mail not set up. Patient called back at 12 noon and voiced his pain was worse and it hurts when he breaths .Patient instructed to go to the Emergency room per Dayton Scrape NP advice for evaluation. Patient voiced understanding.

## 2021-12-04 ENCOUNTER — Telehealth: Payer: Self-pay

## 2021-12-04 NOTE — Telephone Encounter (Signed)
Patient made aware voiced understanding.

## 2021-12-04 NOTE — Telephone Encounter (Signed)
-----   Message from Melodye Ped, NP sent at 12/04/2021  9:43 AM EDT ----- Per Dr. Bobby Rumpf, all of that is fine and no changes with his Revlimid. ----- Message ----- From: Daryel November, LPN Sent: 6/95/0722   9:04 AM EDT To: Melodye Ped, NP  Patient was seen in the ED with a pulmonary embolus placed on Eliquis '20mg'$  x 1 week then eliquis '10mg'$   ongoing, he was also given a script for lidocaine patches and stronger doses of ibprofen and tylenol. He is wanting to make you aware and see if this is okay with his other treatments and can he continue with Revlamid also is he suppose to contine the aspirin '81mg'$  we put him on since he didn't clarify that with the ED yesterday. thanks

## 2021-12-15 ENCOUNTER — Other Ambulatory Visit: Payer: Self-pay | Admitting: Hematology and Oncology

## 2021-12-15 DIAGNOSIS — C8298 Follicular lymphoma, unspecified, lymph nodes of multiple sites: Secondary | ICD-10-CM

## 2021-12-15 MED ORDER — LENALIDOMIDE 10 MG PO CAPS: ORAL_CAPSULE | ORAL | 11 refills | Status: DC

## 2021-12-18 ENCOUNTER — Telehealth: Payer: Self-pay

## 2021-12-18 NOTE — Telephone Encounter (Signed)
I spoke with pt to see how he is tolerating the Revlimid. Pt continues to take the Revlimid @ 8pm with supper. He started this last cycle on 12/05/21. He denies missed doses. Pt has intermittent nausea, he takes antiemetic PRN w/ relief. No emesis, diarrhea nor constipation, abdominal pain, & fevers. He continues to have night sweats. "I have to change my underwear because of the night sweats and I don't sleep with covers". The leg cramps in left leg and foot are a little better, but they happen suddenly, usually early in the mornings when I'm in the bed. The itchy scalp hasn't subsided either. Pt has recently been diagnosed with a blood clot in lung and has been placed on Eliquis. He reports his chest still hurts some, & he notices the SOB when lying in bed. "To be honest, I never feel good anymore. I'm tired more".  All of above message sent to Dr Bobby Rumpf, Georgiann Mohs Schomburg<RPH, and Rhunette Croft @ 1534-awc.

## 2021-12-19 ENCOUNTER — Encounter: Payer: Self-pay | Admitting: Oncology

## 2021-12-24 ENCOUNTER — Encounter: Payer: Self-pay | Admitting: Oncology

## 2021-12-24 ENCOUNTER — Inpatient Hospital Stay: Payer: PPO | Attending: Oncology

## 2021-12-24 DIAGNOSIS — Z5112 Encounter for antineoplastic immunotherapy: Secondary | ICD-10-CM | POA: Insufficient documentation

## 2021-12-24 DIAGNOSIS — Z79899 Other long term (current) drug therapy: Secondary | ICD-10-CM | POA: Insufficient documentation

## 2021-12-24 DIAGNOSIS — C8291 Follicular lymphoma, unspecified, lymph nodes of head, face, and neck: Secondary | ICD-10-CM | POA: Insufficient documentation

## 2021-12-24 DIAGNOSIS — C8298 Follicular lymphoma, unspecified, lymph nodes of multiple sites: Secondary | ICD-10-CM

## 2021-12-24 LAB — HEPATIC FUNCTION PANEL
ALT: 24 U/L (ref 10–40)
AST: 17 (ref 14–40)
Alkaline Phosphatase: 62 (ref 25–125)
Bilirubin, Total: 0.6

## 2021-12-24 LAB — BASIC METABOLIC PANEL
BUN: 19 (ref 4–21)
CO2: 29 — AB (ref 13–22)
Chloride: 103 (ref 99–108)
Creatinine: 1 (ref 0.6–1.3)
Glucose: 96
Potassium: 4.1 mEq/L (ref 3.5–5.1)
Sodium: 136 — AB (ref 137–147)

## 2021-12-24 LAB — COMPREHENSIVE METABOLIC PANEL
Albumin: 3.7 (ref 3.5–5.0)
Calcium: 8.9 (ref 8.7–10.7)

## 2021-12-24 LAB — CBC AND DIFFERENTIAL
HCT: 40 — AB (ref 41–53)
Hemoglobin: 13.4 — AB (ref 13.5–17.5)
Neutrophils Absolute: 1.9
Platelets: 72 10*3/uL — AB (ref 150–400)
WBC: 7.3

## 2021-12-24 LAB — CBC: RBC: 4.89 (ref 3.87–5.11)

## 2021-12-24 MED FILL — Rituximab-pvvr IV Soln 500 MG/50ML (10 MG/ML): INTRAVENOUS | Qty: 80 | Status: AC

## 2021-12-24 NOTE — Progress Notes (Signed)
Proceed with rituximab despite platelets=72,000 per Dr. Bobby Rumpf.

## 2021-12-25 ENCOUNTER — Inpatient Hospital Stay: Payer: PPO

## 2021-12-25 VITALS — BP 141/73 | HR 63 | Temp 98.2°F | Resp 18 | Ht 69.0 in | Wt 195.0 lb

## 2021-12-25 DIAGNOSIS — C8298 Follicular lymphoma, unspecified, lymph nodes of multiple sites: Secondary | ICD-10-CM

## 2021-12-25 DIAGNOSIS — C8291 Follicular lymphoma, unspecified, lymph nodes of head, face, and neck: Secondary | ICD-10-CM | POA: Diagnosis not present

## 2021-12-25 DIAGNOSIS — Z79899 Other long term (current) drug therapy: Secondary | ICD-10-CM | POA: Diagnosis not present

## 2021-12-25 DIAGNOSIS — Z5112 Encounter for antineoplastic immunotherapy: Secondary | ICD-10-CM | POA: Diagnosis not present

## 2021-12-25 MED ORDER — HEPARIN SOD (PORK) LOCK FLUSH 100 UNIT/ML IV SOLN
500.0000 [IU] | Freq: Once | INTRAVENOUS | Status: AC | PRN
  Administered 2021-12-25: 500 [IU]

## 2021-12-25 MED ORDER — ACETAMINOPHEN 325 MG PO TABS
650.0000 mg | ORAL_TABLET | Freq: Once | ORAL | Status: AC
  Administered 2021-12-25: 650 mg via ORAL
  Filled 2021-12-25: qty 2

## 2021-12-25 MED ORDER — DIPHENHYDRAMINE HCL 25 MG PO CAPS
50.0000 mg | ORAL_CAPSULE | Freq: Once | ORAL | Status: AC
  Administered 2021-12-25: 50 mg via ORAL
  Filled 2021-12-25: qty 2

## 2021-12-25 MED ORDER — SODIUM CHLORIDE 0.9 % IV SOLN
375.0000 mg/m2 | Freq: Once | INTRAVENOUS | Status: AC
  Administered 2021-12-25: 800 mg via INTRAVENOUS
  Filled 2021-12-25: qty 30

## 2021-12-25 MED ORDER — SODIUM CHLORIDE 0.9 % IV SOLN: Freq: Once | INTRAVENOUS | Status: AC

## 2021-12-25 MED ORDER — SODIUM CHLORIDE 0.9% FLUSH
10.0000 mL | INTRAVENOUS | Status: DC | PRN
  Administered 2021-12-25: 10 mL

## 2021-12-25 NOTE — Patient Instructions (Signed)
Rituximab Injection What is this medication? RITUXIMAB (ri TUX i mab) is a monoclonal antibody. It is used to treat certain types of cancer like non-Hodgkin lymphoma and chronic lymphocytic leukemia. It is also used to treat rheumatoid arthritis, granulomatosis with polyangiitis, microscopic polyangiitis, and pemphigus vulgaris. This medicine may be used for other purposes; ask your health care provider or pharmacist if you have questions. COMMON BRAND NAME(S): RIABNI, Rituxan, RUXIENCE, truxima What should I tell my care team before I take this medication? They need to know if you have any of these conditions: chest pain heart disease infection especially a viral infection such as chickenpox, cold sores, hepatitis B, or herpes immune system problems irregular heartbeat or rhythm kidney disease low blood counts (white cells, platelets, or red cells) lung disease recent or upcoming vaccine an unusual or allergic reaction to rituximab, other medicines, foods, dyes, or preservatives pregnant or trying to get pregnant breast-feeding How should I use this medication? This medicine is injected into a vein. It is given by a health care provider in a hospital or clinic setting. A special MedGuide will be given to you before each treatment. Be sure to read this information carefully each time. Talk to your health care provider about the use of this medicine in children. While this drug may be prescribed for children as young as 6 months for selected conditions, precautions do apply. Overdosage: If you think you have taken too much of this medicine contact a poison control center or emergency room at once. NOTE: This medicine is only for you. Do not share this medicine with others. What if I miss a dose? Keep appointments for follow-up doses. It is important not to miss your dose. Call your health care provider if you are unable to keep an appointment. What may interact with this medication? Do not  take this medicine with any of the following medicines: live vaccines This medicine may also interact with the following medicines: cisplatin This list may not describe all possible interactions. Give your health care provider a list of all the medicines, herbs, non-prescription drugs, or dietary supplements you use. Also tell them if you smoke, drink alcohol, or use illegal drugs. Some items may interact with your medicine. What should I watch for while using this medication? Your condition will be monitored carefully while you are receiving this medicine. You may need blood work done while you are taking this medicine. This medicine can cause serious infusion reactions. To reduce the risk your health care provider may give you other medicines to take before receiving this one. Be sure to follow the directions from your health care provider. This medicine may increase your risk of getting an infection. Call your health care provider for advice if you get a fever, chills, sore throat, or other symptoms of a cold or flu. Do not treat yourself. Try to avoid being around people who are sick. Call your health care provider if you are around anyone with measles, chickenpox, or if you develop sores or blisters that do not heal properly. Avoid taking medicines that contain aspirin, acetaminophen, ibuprofen, naproxen, or ketoprofen unless instructed by your health care provider. These medicines may hide a fever. This medicine may cause serious skin reactions. They can happen weeks to months after starting the medicine. Contact your health care provider right away if you notice fevers or flu-like symptoms with a rash. The rash may be red or purple and then turn into blisters or peeling of the skin. Or, you  might notice a red rash with swelling of the face, lips or lymph nodes in your neck or under your arms. In some patients, this medicine may cause a serious brain infection that may cause death. If you have any  problems seeing, thinking, speaking, walking, or standing, tell your healthcare professional right away. If you cannot reach your healthcare professional, urgently seek other source of medical care. Do not become pregnant while taking this medicine or for at least 12 months after stopping it. Women should inform their health care provider if they wish to become pregnant or think they might be pregnant. There is potential for serious harm to an unborn child. Talk to your health care provider for more information. Women should use a reliable form of birth control while taking this medicine and for 12 months after stopping it. Do not breast-feed while taking this medicine or for at least 6 months after stopping it. What side effects may I notice from receiving this medication? Side effects that you should report to your health care provider as soon as possible: allergic reactions (skin rash, itching or hives; swelling of the face, lips, or tongue) diarrhea edema (sudden weight gain; swelling of the ankles, feet, hands or other unusual swelling; trouble breathing) fast, irregular heartbeat heart attack (trouble breathing; pain or tightness in the chest, neck, back or arms; unusually weak or tired) infection (fever, chills, cough, sore throat, pain or trouble passing urine) kidney injury (trouble passing urine or change in the amount of urine) liver injury (dark yellow or brown urine; general ill feeling or flu-like symptoms; loss of appetite, right upper belly pain; unusually weak or tired, yellowing of the eyes or skin) low blood pressure (dizziness; feeling faint or lightheaded, falls; unusually weak or tired) low red blood cell counts (trouble breathing; feeling faint; lightheaded, falls; unusually weak or tired) mouth sores redness, blistering, peeling, or loosening of the skin, including inside the mouth stomach pain unusual bruising or bleeding wheezing (trouble breathing with loud or whistling  sounds) vomiting Side effects that usually do not require medical attention (report to your health care provider if they continue or are bothersome): headache joint pain muscle cramps, pain nausea This list may not describe all possible side effects. Call your doctor for medical advice about side effects. You may report side effects to FDA at 1-800-FDA-1088. Where should I keep my medication? This medicine is given in a hospital or clinic. It will not be stored at home. NOTE: This sheet is a summary. It may not cover all possible information. If you have questions about this medicine, talk to your doctor, pharmacist, or health care provider.  2023 Elsevier/Gold Standard (2020-06-10 00:00:00)

## 2021-12-31 ENCOUNTER — Encounter (HOSPITAL_COMMUNITY)
Admission: RE | Admit: 2021-12-31 | Discharge: 2021-12-31 | Disposition: A | Discharge status: Home / Self Care | Payer: PPO | Source: Ambulatory Visit | Attending: Oncology | Admitting: Oncology

## 2021-12-31 DIAGNOSIS — C8291 Follicular lymphoma, unspecified, lymph nodes of head, face, and neck: Secondary | ICD-10-CM | POA: Diagnosis not present

## 2021-12-31 DIAGNOSIS — C8298 Follicular lymphoma, unspecified, lymph nodes of multiple sites: Secondary | ICD-10-CM | POA: Diagnosis not present

## 2021-12-31 LAB — GLUCOSE, CAPILLARY: Glucose-Capillary: 100 mg/dL — ABNORMAL HIGH (ref 70–99)

## 2021-12-31 MED ORDER — FLUDEOXYGLUCOSE F - 18 (FDG) INJECTION
9.7700 | Freq: Once | INTRAVENOUS | Status: AC | PRN
  Administered 2021-12-31: 9.77 via INTRAVENOUS

## 2021-12-31 NOTE — Progress Notes (Unsigned)
[facility]  [address] [place],  [place]  [id number] [phone]  Clinic Day:  01/01/2022  Referring physician: [name], MD  HISTORY OF PRESENT ILLNESS:  The patient is an 81 y.o. male with recurrent low grade follicular lymphoma.  There is also history of diffuse large B cell lymphoma. The patient developed a new focus of biopsy-proven low-grade follicular lymphoma involving his right eyelid.  Due to a PET scan showing multiple nodal areas consistent with disease recurrence, the patient was recently placed on Revlimid/Rituxan.  He comes in today to go over his PET scan to ascertain his new disease baseline after receiving 3 cycles of Revlimid/Rituxan.  Since his last visit, the patient has been doing fairly well.  He does have intermittent weakness.  He recently found out that his testosterone levels were very low.  As a pertains to his lymphoma, he denies having any B symptoms or bulky lymphadenopathy which concern for his disease and sensitivity to his current treatment regimen.  With respect to his lymphoma history, his diffuse large B-cell lymphoma was initially diagnosed in May 2000 per biopsy of a mass in his left thyroid.  As CT scans showed evidence of disease above and below his diaphragm, he was given 6 cycles of CHOP chemotherapy, followed by weekly Rituxan for 8 treatments.  His disease did well up until August 2008 when CT and PET scans showed evidence of disease recurrence.  At that time, a repeat biopsy showed follicular lymphoma, which led to him undergoing 6 cycles of Rituxan-CVP.  The plan was to continue maintenance Rituxan for 2 years, but the patient only received 1 cycle of maintenance Rituxan before discontinuing it in early 2009.  He has not required any systemic therapy since then.  The patient has had biopsies and resections of a fleshy skin nodule behind his right ear, whose pathology revealed follicular lymphoma, but with  focal areas within it suggesting potential transformation into a more aggressive subtype.  However, this area never flared up to where intervention has been necessary.  He recently had problems with his right eyelid, for which a biopsy of the area confirmed the presence of recurrent low-grade follicular lymphoma.  This biopsy and worsening findings on a recent PET scan led to him being placed on Revlimid/Rituxan.  PHYSICAL EXAM:  Blood pressure (!) 169/89, pulse (!) 57, temperature 98.2 F (36.8 C), resp. rate 16, height '5\' 9"'$  (1.753 m), weight 194 lb 14.4 oz (88.4 kg), SpO2 98 %. Wt Readings from Last 3 Encounters:  01/01/22 194 lb 14.4 oz (88.4 kg)  12/25/21 195 lb (88.5 kg)  11/24/21 193 lb 9.6 oz (87.8 kg)   Body mass index is 28.78 kg/m. Performance status (ECOG): 1 Physical Exam Constitutional:      Appearance: Normal appearance. He is not ill-appearing.  HENT:     Mouth/Throat:     Mouth: Mucous membranes are moist.     Pharynx: Oropharynx is clear. No oropharyngeal exudate or posterior oropharyngeal erythema.  Eyes:     Comments: Very minimal thickening in the lower right eyelid consistent with positive treatment response.  Cardiovascular:     Rate and Rhythm: Normal rate and regular rhythm.     Heart sounds: No murmur heard.    No friction rub. No gallop.  Pulmonary:     Effort: Pulmonary effort is normal. No respiratory distress.     Breath sounds: Normal breath sounds. No wheezing, rhonchi or rales.  Abdominal:  General: Bowel sounds are normal. There is no distension.     Palpations: Abdomen is soft. There is no mass.     Tenderness: There is no abdominal tenderness.  Musculoskeletal:        General: No swelling.     Right lower leg: No edema.     Left lower leg: No edema.  Lymphadenopathy:     Cervical: No cervical adenopathy.     Upper Body:     Right upper body: No supraclavicular or axillary adenopathy.     Left upper body: No supraclavicular or axillary  adenopathy.     Lower Body: No right inguinal adenopathy. No left inguinal adenopathy.  Skin:    General: Skin is warm.     Coloration: Skin is not jaundiced.     Findings: No lesion or rash.  Neurological:     General: No focal deficit present.     Mental Status: He is alert and oriented to person, place, and time. Mental status is at baseline.  Psychiatric:        Mood and Affect: Mood normal.        Behavior: Behavior normal.        Thought Content: Thought content normal.   SCANS: His PET scan revealed the following: FINDINGS: Mediastinal blood pool activity: SUV max 2.7  Liver activity: SUV max 4.3  NECK: No enlarged or hypermetabolic lymph nodes in the neck.  Incidental CT findings: none  CHEST: No enlarged or hypermetabolic axillary, mediastinal or hilar lymph nodes. No hypermetabolic pulmonary findings.  Incidental CT findings: Coronary atherosclerosis. Atherosclerotic nonaneurysmal thoracic aorta. Right subclavian Port-A-Cath terminates in the middle third of the SVC.  ABDOMEN/PELVIS:  No residual hypermetabolic lymph nodes in the abdomen. Previously described hypermetabolic retrocaval adenopathy has resolved. Representative 1.1 cm low left para-aortic node with max SUV 2.4 (series 4/image 150), previously 1.6 cm with max SUV 6.6, significantly decreased in size and metabolism.  Previously enlarged hypermetabolic 3.5 cm right external iliac lymph node with max SUV 45.1 now measures 0.9 cm with max SUV 2.2 (series 4/image 177), substantially decreased in size and metabolism. Previously described hypermetabolic small right inguinal lymph node has resolved. No new discrete hypermetabolic pelvic lymph nodes. Residual mild hypermetabolism within vague matted soft tissue throughout the bilateral common iliac chains with max SUV 4.2 and measuring 2.6 cm in short axis diameter on the left on series 4/image 157, previously measuring 3.0 cm in short axis diameter on the  left using similar measurement technique with previous max SUV 5.0, mildly decreased in size and metabolism.  No abnormal hypermetabolic activity within the liver, pancreas, adrenal glands, or spleen.  Incidental CT findings: Simple 3.1 cm medial upper right renal cyst, for which no follow-up imaging is recommended. Atherosclerotic nonaneurysmal abdominal aorta.  SKELETON: No focal hypermetabolic activity to suggest skeletal metastasis.  Incidental CT findings: Right total hip arthroplasty. Bilateral posterior lumbar spinal fusion.  IMPRESSION: 1. Previously described hypermetabolic retroperitoneal, right external iliac and right inguinal lymph nodes have substantially decreased in size and metabolism, with residual Deauville category 2 uptake in these locations. 2. Nonspecific residual mild hypermetabolism within vague matted soft tissue throughout the bilateral common iliac chains, mildly decreased in size and metabolism. This finding is equivocal for residual Deauville category 3 activity versus chronic post treatment change within these nodal chains. 3. No new sites of hypermetabolic metastatic disease. No skeletal or solid organ involvement. 4. Aortic Atherosclerosis (ICD10-I70.0).  LABS:   ASSESSMENT & PLAN:  Assessment/Plan:  An 81 y.o. male with biopsy-proven recurrence of low-grade follicular lymphoma.  He also has a history of diffuse large B-cell lymphoma.   In clinic today, went over his PET scan images with him, for which she could see the significant disease response he has had after 3 cycles of Revlimid/Rituxan.  Overall, I see no persistently active areas of disease.  Understandably, the patient was pleased with his scan results.  Based upon this, he will proceed with his fourth cycle of Revlimid/Rituxan.  Clinically, the patient appears to be doing well.  I will see him back in 4 weeks before he is into his 5th cycle of Revlimid/Rituxan.  The patient understands all  the plans discussed today and is in agreement with them.     Nalin Mazzocco Macarthur Critchley, MD

## 2022-01-01 ENCOUNTER — Inpatient Hospital Stay (INDEPENDENT_AMBULATORY_CARE_PROVIDER_SITE_OTHER): Payer: PPO | Admitting: Oncology

## 2022-01-01 ENCOUNTER — Other Ambulatory Visit: Payer: Self-pay | Admitting: Oncology

## 2022-01-01 ENCOUNTER — Other Ambulatory Visit: Payer: Self-pay | Admitting: Hematology and Oncology

## 2022-01-01 ENCOUNTER — Inpatient Hospital Stay: Payer: PPO

## 2022-01-01 VITALS — BP 169/89 | HR 57 | Temp 98.2°F | Resp 16 | Ht 69.0 in | Wt 194.9 lb

## 2022-01-01 DIAGNOSIS — C8208 Follicular lymphoma grade I, lymph nodes of multiple sites: Secondary | ICD-10-CM

## 2022-01-01 DIAGNOSIS — C8298 Follicular lymphoma, unspecified, lymph nodes of multiple sites: Secondary | ICD-10-CM

## 2022-01-01 DIAGNOSIS — Z5112 Encounter for antineoplastic immunotherapy: Secondary | ICD-10-CM | POA: Diagnosis not present

## 2022-01-01 LAB — BASIC METABOLIC PANEL
BUN: 21 (ref 4–21)
CO2: 31 — AB (ref 13–22)
Chloride: 103 (ref 99–108)
Creatinine: 1 (ref 0.6–1.3)
Glucose: 93
Potassium: 4.3 mEq/L (ref 3.5–5.1)
Sodium: 137 (ref 137–147)

## 2022-01-01 LAB — HEPATIC FUNCTION PANEL
ALT: 22 U/L (ref 10–40)
AST: 19 (ref 14–40)
Alkaline Phosphatase: 55 (ref 25–125)
Bilirubin, Total: 0.8

## 2022-01-01 LAB — CBC AND DIFFERENTIAL
HCT: 43 (ref 41–53)
Hemoglobin: 14.3 (ref 13.5–17.5)
Neutrophils Absolute: 2.24
Platelets: 161 10*3/uL (ref 150–400)
WBC: 7

## 2022-01-01 LAB — COMPREHENSIVE METABOLIC PANEL
Albumin: 4 (ref 3.5–5.0)
Calcium: 9.2 (ref 8.7–10.7)

## 2022-01-01 LAB — CBC
MCV: 83 (ref 80–94)
RBC: 5.14 — AB (ref 3.87–5.11)

## 2022-01-01 LAB — LACTATE DEHYDROGENASE: LDH: 147 U/L (ref 98–192)

## 2022-01-02 ENCOUNTER — Encounter: Payer: Self-pay | Admitting: Oncology

## 2022-01-02 DIAGNOSIS — L821 Other seborrheic keratosis: Secondary | ICD-10-CM | POA: Diagnosis not present

## 2022-01-02 DIAGNOSIS — L578 Other skin changes due to chronic exposure to nonionizing radiation: Secondary | ICD-10-CM | POA: Diagnosis not present

## 2022-01-02 DIAGNOSIS — C44622 Squamous cell carcinoma of skin of right upper limb, including shoulder: Secondary | ICD-10-CM | POA: Diagnosis not present

## 2022-01-02 DIAGNOSIS — L570 Actinic keratosis: Secondary | ICD-10-CM | POA: Diagnosis not present

## 2022-01-12 ENCOUNTER — Other Ambulatory Visit: Payer: Self-pay

## 2022-01-12 DIAGNOSIS — C44622 Squamous cell carcinoma of skin of right upper limb, including shoulder: Secondary | ICD-10-CM | POA: Diagnosis not present

## 2022-01-12 DIAGNOSIS — L570 Actinic keratosis: Secondary | ICD-10-CM | POA: Diagnosis not present

## 2022-01-14 ENCOUNTER — Telehealth: Payer: Self-pay

## 2022-01-14 DIAGNOSIS — R5383 Other fatigue: Secondary | ICD-10-CM | POA: Diagnosis not present

## 2022-01-14 DIAGNOSIS — R630 Anorexia: Secondary | ICD-10-CM | POA: Diagnosis not present

## 2022-01-14 DIAGNOSIS — R5381 Other malaise: Secondary | ICD-10-CM | POA: Diagnosis not present

## 2022-01-14 DIAGNOSIS — R252 Cramp and spasm: Secondary | ICD-10-CM | POA: Diagnosis not present

## 2022-01-14 DIAGNOSIS — R6883 Chills (without fever): Secondary | ICD-10-CM | POA: Diagnosis not present

## 2022-01-14 DIAGNOSIS — E860 Dehydration: Secondary | ICD-10-CM | POA: Diagnosis not present

## 2022-01-14 DIAGNOSIS — Z20822 Contact with and (suspected) exposure to covid-19: Secondary | ICD-10-CM | POA: Diagnosis not present

## 2022-01-14 DIAGNOSIS — R61 Generalized hyperhidrosis: Secondary | ICD-10-CM | POA: Diagnosis not present

## 2022-01-14 DIAGNOSIS — R2689 Other abnormalities of gait and mobility: Secondary | ICD-10-CM | POA: Diagnosis not present

## 2022-01-14 NOTE — Telephone Encounter (Signed)
-----   Message from Melodye Ped, NP sent at 01/14/2022  9:14 AM EDT ----- Regarding: FW: Fever 100.4 Please have him evaluated in ED or Urgent Care. ----- Message ----- From: Belva Chimes, LPN Sent: 10/13/5359   8:50 AM EDT To: Melodye Ped, NP Subject: Fever 100.4                                    Patient called stating he has no energy, has been sweating and also has had fevers 100.4, and 100.2 this morning. Some nausea. Pain in the right knee and lower back pain which is unusual for patient. No coughing or congestion noted. Patient wanting to know what he needs to do.

## 2022-01-14 NOTE — Telephone Encounter (Signed)
Notified patient of Melissa NP recommendation.

## 2022-01-20 ENCOUNTER — Other Ambulatory Visit: Payer: Self-pay

## 2022-01-21 ENCOUNTER — Inpatient Hospital Stay: Payer: PPO | Attending: Oncology

## 2022-01-21 DIAGNOSIS — Z5112 Encounter for antineoplastic immunotherapy: Secondary | ICD-10-CM | POA: Diagnosis not present

## 2022-01-21 DIAGNOSIS — Z79899 Other long term (current) drug therapy: Secondary | ICD-10-CM | POA: Insufficient documentation

## 2022-01-21 DIAGNOSIS — C8298 Follicular lymphoma, unspecified, lymph nodes of multiple sites: Secondary | ICD-10-CM

## 2022-01-21 DIAGNOSIS — C8290 Follicular lymphoma, unspecified, unspecified site: Secondary | ICD-10-CM | POA: Diagnosis not present

## 2022-01-21 DIAGNOSIS — C8291 Follicular lymphoma, unspecified, lymph nodes of head, face, and neck: Secondary | ICD-10-CM | POA: Diagnosis present

## 2022-01-21 LAB — BASIC METABOLIC PANEL
BUN: 19 (ref 4–21)
CO2: 31 — AB (ref 13–22)
Chloride: 100 (ref 99–108)
Creatinine: 1.1 (ref 0.6–1.3)
Glucose: 94
Potassium: 4.2 mEq/L (ref 3.5–5.1)
Sodium: 136 — AB (ref 137–147)

## 2022-01-21 LAB — CBC AND DIFFERENTIAL
HCT: 38 — AB (ref 41–53)
Hemoglobin: 13.9 (ref 13.5–17.5)
Neutrophils Absolute: 1.62
Platelets: 149 10*3/uL — AB (ref 150–400)
WBC: 5.6

## 2022-01-21 LAB — COMPREHENSIVE METABOLIC PANEL
Albumin: 3.5 (ref 3.5–5.0)
Calcium: 9 (ref 8.7–10.7)

## 2022-01-21 LAB — LACTATE DEHYDROGENASE: LDH: 114 U/L (ref 98–192)

## 2022-01-21 LAB — HEPATIC FUNCTION PANEL
ALT: 25 U/L (ref 10–40)
AST: 15 (ref 14–40)
Alkaline Phosphatase: 56 (ref 25–125)
Bilirubin, Total: 0.6

## 2022-01-21 LAB — CBC: RBC: 4.71 (ref 3.87–5.11)

## 2022-01-22 ENCOUNTER — Inpatient Hospital Stay: Payer: PPO

## 2022-01-22 ENCOUNTER — Encounter: Payer: Self-pay | Admitting: Oncology

## 2022-01-22 VITALS — BP 121/74 | HR 66 | Temp 98.2°F | Resp 20 | Ht 69.0 in | Wt 195.0 lb

## 2022-01-22 DIAGNOSIS — C8298 Follicular lymphoma, unspecified, lymph nodes of multiple sites: Secondary | ICD-10-CM

## 2022-01-22 DIAGNOSIS — Z5112 Encounter for antineoplastic immunotherapy: Secondary | ICD-10-CM | POA: Diagnosis not present

## 2022-01-22 MED ORDER — ACETAMINOPHEN 325 MG PO TABS
650.0000 mg | ORAL_TABLET | Freq: Once | ORAL | Status: AC
  Administered 2022-01-22: 650 mg via ORAL
  Filled 2022-01-22: qty 2

## 2022-01-22 MED ORDER — SODIUM CHLORIDE 0.9 % IV SOLN
375.0000 mg/m2 | Freq: Once | INTRAVENOUS | Status: AC
  Administered 2022-01-22: 800 mg via INTRAVENOUS
  Filled 2022-01-22: qty 50

## 2022-01-22 MED ORDER — SODIUM CHLORIDE 0.9 % IV SOLN: Freq: Once | INTRAVENOUS | Status: AC

## 2022-01-22 MED ORDER — SODIUM CHLORIDE 0.9% FLUSH
10.0000 mL | INTRAVENOUS | Status: DC | PRN
  Administered 2022-01-22: 10 mL

## 2022-01-22 MED ORDER — HEPARIN SOD (PORK) LOCK FLUSH 100 UNIT/ML IV SOLN
500.0000 [IU] | Freq: Once | INTRAVENOUS | Status: AC | PRN
  Administered 2022-01-22: 500 [IU]

## 2022-01-22 MED ORDER — DIPHENHYDRAMINE HCL 25 MG PO CAPS
50.0000 mg | ORAL_CAPSULE | Freq: Once | ORAL | Status: AC
  Administered 2022-01-22: 50 mg via ORAL
  Filled 2022-01-22: qty 2

## 2022-01-22 NOTE — Patient Instructions (Signed)
Rituximab Injection What is this medication? RITUXIMAB (ri TUX i mab) treats leukemia and lymphoma. It works by blocking a protein that causes cancer cells to grow and multiply. This helps to slow or stop the spread of cancer cells. It may also be used to treat autoimmune conditions, such as arthritis. It works by slowing down an overactive immune system. It is a monoclonal antibody. This medicine may be used for other purposes; ask your health care provider or pharmacist if you have questions. COMMON BRAND NAME(S): RIABNI, Rituxan, RUXIENCE, truxima What should I tell my care team before I take this medication? They need to know if you have any of these conditions: Chest pain Heart disease Immune system problems Infection, such as chickenpox, cold sores, hepatitis B, herpes Irregular heartbeat or rhythm Kidney disease Low blood counts, such as low white cells, platelets, red cells Lung disease Recent or upcoming vaccine An unusual or allergic reaction to rituximab, other medications, foods, dyes, or preservatives Pregnant or trying to get pregnant Breast-feeding How should I use this medication? This medication is injected into a vein. It is given by a care team in a hospital or clinic setting. A special MedGuide will be given to you before each treatment. Be sure to read this information carefully each time. Talk to your care team about the use of this medication in children. While this medication may be prescribed for children as young as 6 months for selected conditions, precautions do apply. Overdosage: If you think you have taken too much of this medicine contact a poison control center or emergency room at once. NOTE: This medicine is only for you. Do not share this medicine with others. What if I miss a dose? Keep appointments for follow-up doses. It is important not to miss your dose. Call your care team if you are unable to keep an appointment. What may interact with this  medication? Do not take this medication with any of the following: Live vaccines This medication may also interact with the following: Cisplatin This list may not describe all possible interactions. Give your health care provider a list of all the medicines, herbs, non-prescription drugs, or dietary supplements you use. Also tell them if you smoke, drink alcohol, or use illegal drugs. Some items may interact with your medicine. What should I watch for while using this medication? Your condition will be monitored carefully while you are receiving this medication. You may need blood work while taking this medication. This medication can cause serious infusion reactions. To reduce the risk your care team may give you other medications to take before receiving this one. Be sure to follow the directions from your care team. This medication may increase your risk of getting an infection. Call your care team for advice if you get a fever, chills, sore throat, or other symptoms of a cold or flu. Do not treat yourself. Try to avoid being around people who are sick. Call your care team if you are around anyone with measles, chickenpox, or if you develop sores or blisters that do not heal properly. Avoid taking medications that contain aspirin, acetaminophen, ibuprofen, naproxen, or ketoprofen unless instructed by your care team. These medications may hide a fever. This medication may cause serious skin reactions. They can happen weeks to months after starting the medication. Contact your care team right away if you notice fevers or flu-like symptoms with a rash. The rash may be red or purple and then turn into blisters or peeling of the skin.  You may also notice a red rash with swelling of the face, lips, or lymph nodes in your neck or under your arms. In some patients, this medication may cause a serious brain infection that may cause death. If you have any problems seeing, thinking, speaking, walking, or  standing, tell your care team right away. If you cannot reach your care team, urgently seek another source of medical care. Talk to your care team if you may be pregnant. Serious birth defects can occur if you take this medication during pregnancy and for 12 months after the last dose. You will need a negative pregnancy test before starting this medication. Contraception is recommended while taking this medication and for 12 months after the last dose. Your care team can help you find the option that works for you. Do not breastfeed while taking this medication and for at least 6 months after the last dose. What side effects may I notice from receiving this medication? Side effects that you should report to your care team as soon as possible: Allergic reactions or angioedema--skin rash, itching or hives, swelling of the face, eyes, lips, tongue, arms, or legs, trouble swallowing or breathing Bowel blockage--stomach cramping, unable to have a bowel movement or pass gas, loss of appetite, vomiting Dizziness, loss of balance or coordination, confusion or trouble speaking Heart attack--pain or tightness in the chest, shoulders, arms, or jaw, nausea, shortness of breath, cold or clammy skin, feeling faint or lightheaded Heart rhythm changes--fast or irregular heartbeat, dizziness, feeling faint or lightheaded, chest pain, trouble breathing Infection--fever, chills, cough, sore throat, wounds that don't heal, pain or trouble when passing urine, general feeling of discomfort or being unwell Infusion reactions--chest pain, shortness of breath or trouble breathing, feeling faint or lightheaded Kidney injury--decrease in the amount of urine, swelling of the ankles, hands, or feet Liver injury--right upper belly pain, loss of appetite, nausea, light-colored stool, dark yellow or brown urine, yellowing skin or eyes, unusual weakness or fatigue Redness, blistering, peeling, or loosening of the skin, including  inside the mouth Stomach pain that is severe, does not go away, or gets worse Tumor lysis syndrome (TLS)--nausea, vomiting, diarrhea, decrease in the amount of urine, dark urine, unusual weakness or fatigue, confusion, muscle pain or cramps, fast or irregular heartbeat, joint pain Side effects that usually do not require medical attention (report to your care team if they continue or are bothersome): Headache Joint pain Nausea Runny or stuffy nose Unusual weakness or fatigue This list may not describe all possible side effects. Call your doctor for medical advice about side effects. You may report side effects to FDA at 1-800-FDA-1088. Where should I keep my medication? This medication is given in a hospital or clinic. It will not be stored at home. NOTE: This sheet is a summary. It may not cover all possible information. If you have questions about this medicine, talk to your doctor, pharmacist, or health care provider.  2023 Elsevier/Gold Standard (2021-10-27 00:00:00)

## 2022-01-23 ENCOUNTER — Encounter: Payer: Self-pay | Admitting: Oncology

## 2022-01-29 ENCOUNTER — Other Ambulatory Visit: Payer: Self-pay | Admitting: *Deleted

## 2022-01-29 NOTE — Progress Notes (Signed)
[facility]  [address] [place],  [place]  [address] [phone]  Clinic Day:  01/30/2022  Referring physician: [name], MD  HISTORY OF PRESENT ILLNESS:  The patient is an 81 y.o. male with recurrent low grade follicular lymphoma.  There is also history of diffuse large B cell lymphoma. The patient developed a new focus of biopsy-proven low-grade follicular lymphoma involving his right eyelid.  Due to a PET scan showing multiple nodal areas consistent with disease recurrence, the patient is currently on Revlimid/Rituxan.  He comes in today to be evaluated before heading into his 5th cycle of Revlimid/Rituxan.  The patient claims to have tolerated his fourth cycle of treatment relatively poorly.  He has definitely noticed increased fatigue, as well as increased dry skin and rashes.  As he has been exposed to Rituxan multiple times in the past, he believes his Revlimid is the culprit behind his recent problems.  He is currently taking Revlimid 20 mg daily, every 3 out of 4 weeks.  He is becoming more concerned that this dose may be too potent for him.  As it pertains to his lymphoma, he denies having any B symptoms or bulky lymphadenopathy which concern for his disease losing sensitivity to his current treatment regimen.  With respect to his lymphoma history, his diffuse large B-cell lymphoma was initially diagnosed in May 2000 per biopsy of a mass in his left thyroid.  As CT scans showed evidence of disease above and below his diaphragm, he was given 6 cycles of CHOP chemotherapy, followed by weekly Rituxan for 8 treatments.  His disease did well up until August 2008 when CT and PET scans showed evidence of disease recurrence.  At that time, a repeat biopsy showed follicular lymphoma, which led to him undergoing 6 cycles of Rituxan-CVP.  The plan was to continue maintenance Rituxan for 2 years, but the patient only received 1 cycle of maintenance Rituxan  before discontinuing it in early 2009.  The patient has had biopsies and resections of a fleshy skin nodule behind his right ear, whose pathology revealed follicular lymphoma, but with focal areas within it suggesting potential transformation into a more aggressive subtype.  However, this area never flared up to where intervention has been necessary.  He recently had problems with his right eyelid, for which a biopsy of the area confirmed the presence of recurrent low-grade follicular lymphoma.  This biopsy and worsening findings on a recent PET scan led to him being placed on Revlimid/Rituxan, which is the first systemic therapy he has received since 2009.  PHYSICAL EXAM:  Blood pressure (!) 141/65, pulse (!) 53, temperature 98.4 F (36.9 C), resp. rate 18, height '5\' 9"'$  (1.753 m), weight 194 lb 9.6 oz (88.3 kg), SpO2 93 %. Wt Readings from Last 3 Encounters:  01/30/22 194 lb 9.6 oz (88.3 kg)  01/22/22 195 lb (88.5 kg)  01/01/22 194 lb 14.4 oz (88.4 kg)   Body mass index is 28.74 kg/m. Performance status (ECOG): 1 Physical Exam Constitutional:      Appearance: Normal appearance. He is not ill-appearing.  HENT:     Mouth/Throat:     Mouth: Mucous membranes are moist.     Pharynx: Oropharynx is clear. No oropharyngeal exudate or posterior oropharyngeal erythema.  Eyes:     Comments: No evidence of any lower right eyelid thickening, consistent with positive treatment response  Cardiovascular:     Rate and Rhythm: Normal rate and regular rhythm.  Heart sounds: No murmur heard.    No friction rub. No gallop.  Pulmonary:     Effort: Pulmonary effort is normal. No respiratory distress.     Breath sounds: Normal breath sounds. No wheezing, rhonchi or rales.  Abdominal:     General: Bowel sounds are normal. There is no distension.     Palpations: Abdomen is soft. There is no mass.     Tenderness: There is no abdominal tenderness.  Musculoskeletal:        General: No swelling.     Right  lower leg: No edema.     Left lower leg: No edema.  Lymphadenopathy:     Cervical: No cervical adenopathy.     Upper Body:     Right upper body: No supraclavicular or axillary adenopathy.     Left upper body: No supraclavicular or axillary adenopathy.     Lower Body: No right inguinal adenopathy. No left inguinal adenopathy.  Skin:    General: Skin is warm.     Coloration: Skin is not jaundiced.     Findings: No lesion or rash.     Comments: Diffusely dry skin with numerous brown-colored flat moles over most of his body  Neurological:     General: No focal deficit present.     Mental Status: He is alert and oriented to person, place, and time. Mental status is at baseline.  Psychiatric:        Mood and Affect: Mood normal.        Behavior: Behavior normal.        Thought Content: Thought content normal.    LABS:  Latest Reference Range & Units 01/21/22 08:59 01/30/22 00:00  Sodium 137 - 147   137 (E)  Potassium 3.5 - 5.1 mEq/L  4.3 (E)  Chloride 99 - 108   104 (E)  CO2 13 - 22   31 ! (E)  Glucose   97 (E)  BUN 4 - 21   20 (E)  Creatinine 0.6 - 1.3   1.1 (E)  Calcium 8.7 - 10.7   8.9 (E)  Alkaline Phosphatase 25 - 125   53 (E)  Albumin 3.5 - 5.0   3.6 (E)  AST 14 - 40   14 (E)  ALT 10 - 40 U/L  17 (E)  Bilirubin, Total   0.6 (E)  LDH 98 - 192 U/L 114   !: Data is abnormal (E): External lab result  Latest Reference Range & Units 01/30/22 00:00  WBC  5.7 (E)  RBC 3.87 - 5.11  4.67 (E)  Hemoglobin 13.5 - 17.5  12.7 ! (E)  HCT 41 - 53  38 ! (E)  Platelets 150 - 400 K/uL 250 (E)  !: Data is abnormal (E): External lab result  ASSESSMENT & PLAN:  Assessment/Plan:  An 81 y.o. male with biopsy-proven recurrence of low-grade follicular lymphoma.  He also has a history of diffuse large B-cell lymphoma.   After evaluating the patient today, I do believe his Revlimid is the major reason the moderate downturn in his daily quality of life.  Based upon this, I have told him to hold  his Revlimid for the next 2 weeks.  I will see him back in 2 weeks for repeat clinical assessment.  If he is feeling better at that time, his Revlimid will be restarted, but only at 10 mg daily, every 3 out of 4 weeks.  I reassured the patient today that there remains nothing per his  labs or physical exam to suggest his disease is becoming refractory to therapy.  Furthermore, his recent PET scan also showed an excellent treatment response after his first 3 cycles of Revlimid/Rituxan.  The patient understands all the plans discussed today and knows to contact our office before his next visit if he runs into any problems that require immediate clinical attention.  Casidy Alberta Macarthur Critchley, MD

## 2022-01-29 NOTE — Patient Outreach (Signed)
  Care Coordination   01/29/2022 Name: Sean Hodges MRN: [id number] DOB: 1940/08/27   Care Coordination Outreach Attempts:  An unsuccessful telephone outreach was attempted today to offer the patient information about available care coordination services as a benefit of their health plan.   Follow Up Plan:  No further outreach attempts will be made at this time. We have been unable to contact the patient to offer or enroll patient in care coordination services  Encounter Outcome:  Pt. Refused  Care Coordination Interventions Activated:  No   Care Coordination Interventions:  No, not indicated    Emelia Loron RN, BSN [place] [phone] [email or web address]

## 2022-01-30 ENCOUNTER — Inpatient Hospital Stay: Payer: PPO

## 2022-01-30 ENCOUNTER — Other Ambulatory Visit: Payer: Self-pay | Admitting: Oncology

## 2022-01-30 ENCOUNTER — Inpatient Hospital Stay (INDEPENDENT_AMBULATORY_CARE_PROVIDER_SITE_OTHER): Payer: PPO | Admitting: Oncology

## 2022-01-30 VITALS — BP 141/65 | HR 53 | Temp 98.4°F | Resp 18 | Ht 69.0 in | Wt 194.6 lb

## 2022-01-30 DIAGNOSIS — C8218 Follicular lymphoma grade II, lymph nodes of multiple sites: Secondary | ICD-10-CM

## 2022-01-30 DIAGNOSIS — C8298 Follicular lymphoma, unspecified, lymph nodes of multiple sites: Secondary | ICD-10-CM | POA: Diagnosis not present

## 2022-01-30 DIAGNOSIS — D649 Anemia, unspecified: Secondary | ICD-10-CM | POA: Diagnosis not present

## 2022-01-30 LAB — BASIC METABOLIC PANEL
BUN: 20 (ref 4–21)
CO2: 31 — AB (ref 13–22)
Chloride: 104 (ref 99–108)
Creatinine: 1.1 (ref 0.6–1.3)
Glucose: 97
Potassium: 4.3 mEq/L (ref 3.5–5.1)
Sodium: 137 (ref 137–147)

## 2022-01-30 LAB — HEPATIC FUNCTION PANEL
ALT: 17 U/L (ref 10–40)
AST: 14 (ref 14–40)
Alkaline Phosphatase: 53 (ref 25–125)
Bilirubin, Total: 0.6

## 2022-01-30 LAB — COMPREHENSIVE METABOLIC PANEL
Albumin: 3.6 (ref 3.5–5.0)
Calcium: 8.9 (ref 8.7–10.7)

## 2022-01-30 LAB — CBC AND DIFFERENTIAL
HCT: 38 — AB (ref 41–53)
Hemoglobin: 12.7 — AB (ref 13.5–17.5)
Neutrophils Absolute: 2.28
Platelets: 250 10*3/uL (ref 150–400)
WBC: 5.7

## 2022-01-30 LAB — CBC: RBC: 4.67 (ref 3.87–5.11)

## 2022-01-30 NOTE — Progress Notes (Signed)
ON PATHWAY REGIMEN - Lymphoma and CLL  No Change  Continue With Treatment as Ordered.  Original Decision Date/Time: 09/23/2021 10:28     A cycle is every 28 days:     Lenalidomide      Rituximab-xxxx      Rituximab-xxxx   **Always confirm dose/schedule in your pharmacy ordering system**  Patient Characteristics: Follicular Lymphoma, Grades 1, 2, and 3A, Third Line, Not a Candidate for CAR T-Cell Therapy, No Prior Lenalidomide Disease Type: Follicular Lymphoma, Grade 1, 2, or 3A Disease Type: Not Applicable Disease Type: Not Applicable Line of Therapy: Third Line Patient Characteristics: Not a Candidate for CAR T-Cell Therapy Treatment History: No Prior Lenalidomide Intent of Therapy: Non-Curative / Palliative Intent, Discussed with Patient

## 2022-01-31 ENCOUNTER — Other Ambulatory Visit: Payer: Self-pay

## 2022-02-04 ENCOUNTER — Other Ambulatory Visit: Payer: Self-pay

## 2022-02-05 ENCOUNTER — Telehealth: Payer: Self-pay | Admitting: Cardiology

## 2022-02-05 NOTE — Telephone Encounter (Signed)
Patient would like to switch from Dr. Martinique to Dr. Geraldo Pitter as he lives closer to this office and has saw Dr. Geraldo Pitter in the hospital a few times. Please OK this change/kbl 02/05/22

## 2022-02-05 NOTE — Telephone Encounter (Signed)
Called and LVM on home phone as cellphone does not have VM for pt to call and schedule an appt with Revankar/kbl 02/05/22

## 2022-02-06 ENCOUNTER — Other Ambulatory Visit: Payer: Self-pay

## 2022-02-09 ENCOUNTER — Other Ambulatory Visit: Payer: Self-pay | Admitting: Hematology and Oncology

## 2022-02-10 ENCOUNTER — Telehealth: Payer: Self-pay

## 2022-02-10 NOTE — Telephone Encounter (Signed)
CELGENE AUTH # [id number] REVLIMID 10 MG 21 DAYS

## 2022-02-11 ENCOUNTER — Encounter: Payer: Self-pay | Admitting: Oncology

## 2022-02-12 NOTE — Progress Notes (Signed)
[facility]  [address] [place],  [place]  [address] [phone]  Clinic Day:  02/13/2022  Referring physician: [name], MD  HISTORY OF PRESENT ILLNESS:  The patient is an 81 y.o. male with recurrent low grade follicular lymphoma.  There is also history of diffuse large B cell lymphoma. The patient developed a new focus of biopsy-proven low-grade follicular lymphoma involving his right eyelid.  He comes in today to be evaluated before heading into his 5th cycle of Revlimid/Rituxan.  Of note, his Revlimid was held over the past 2 weeks as he had low counts and increased fatigue from this agent.  At his last visit, he wished to have his Revlimid dose decreased, for which the plan is now to restart him at Revlimid 10 mg daily, every 3 to 4 weeks.  Overall, he claims to be feeling much better after having more time to recuperate from his 4th cycle of Revlimid.  He now feels fine proceeding with his fifth cycle of Revlimid.  With respect to his lymphoma history, his diffuse large B-cell lymphoma was initially diagnosed in May 2000 per biopsy of a mass in his left thyroid.  As CT scans showed evidence of disease above and below his diaphragm, he was given 6 cycles of CHOP chemotherapy, followed by weekly Rituxan for 8 treatments.  His disease did well up until August 2008 when CT and PET scans showed evidence of disease recurrence.  At that time, a repeat biopsy showed follicular lymphoma, which led to him undergoing 6 cycles of Rituxan-CVP.  The plan was to continue maintenance Rituxan for 2 years, but the patient only received 1 cycle of maintenance Rituxan before discontinuing it in early 2009.  The patient has had biopsies and resections of a fleshy skin nodule behind his right ear, whose pathology revealed follicular lymphoma, but with focal areas within it suggesting potential transformation into a more aggressive subtype.  However, this area never flared  up to where intervention has been necessary.  He recently had problems with his right eyelid, for which a biopsy of the area confirmed the presence of recurrent low-grade follicular lymphoma.  This biopsy and worsening findings on a recent PET scan led to him being placed on Revlimid/Rituxan, which is the first systemic therapy he has received since 2009.  PHYSICAL EXAM:  Blood pressure (!) 144/70, pulse 63, temperature 98.2 F (36.8 C), resp. rate 16, height '5\' 9"'$  (1.753 m), weight 195 lb 4.8 oz (88.6 kg), SpO2 95 %. Wt Readings from Last 3 Encounters:  02/13/22 195 lb 4.8 oz (88.6 kg)  01/30/22 194 lb 9.6 oz (88.3 kg)  01/22/22 195 lb (88.5 kg)   Body mass index is 28.84 kg/m. Performance status (ECOG): 1 Physical Exam Constitutional:      Appearance: Normal appearance. He is not ill-appearing.  HENT:     Mouth/Throat:     Mouth: Mucous membranes are moist.     Pharynx: Oropharynx is clear. No oropharyngeal exudate or posterior oropharyngeal erythema.  Eyes:     Comments: No evidence of any lower right eyelid thickening, consistent with positive treatment response  Cardiovascular:     Rate and Rhythm: Normal rate and regular rhythm.     Heart sounds: No murmur heard.    No friction rub. No gallop.  Pulmonary:     Effort: Pulmonary effort is normal. No respiratory distress.     Breath sounds: Normal breath sounds. No wheezing, rhonchi or rales.  Abdominal:     General: Bowel sounds are normal. There is no distension.     Palpations: Abdomen is soft. There is no mass.     Tenderness: There is no abdominal tenderness.  Musculoskeletal:        General: No swelling.     Right lower leg: No edema.     Left lower leg: No edema.  Lymphadenopathy:     Cervical: No cervical adenopathy.     Upper Body:     Right upper body: No supraclavicular or axillary adenopathy.     Left upper body: No supraclavicular or axillary adenopathy.     Lower Body: No right inguinal adenopathy. No left  inguinal adenopathy.  Skin:    General: Skin is warm.     Coloration: Skin is not jaundiced.     Findings: No lesion or rash.     Comments: Diffusely dry skin with numerous brown-colored flat moles over most of his body  Neurological:     General: No focal deficit present.     Mental Status: He is alert and oriented to person, place, and time. Mental status is at baseline.  Psychiatric:        Mood and Affect: Mood normal.        Behavior: Behavior normal.        Thought Content: Thought content normal.    LABS:  Latest Reference Range & Units 02/13/22 00:00 02/13/22 10:04  LDH 98 - 192 U/L  135  WBC  6.6 (E)   RBC 3.87 - 5.11  4.92 (E)   Hemoglobin 13.5 - 17.5  13.5 (E)   HCT 41 - 53  40 ! (E)   Platelets 150 - 400 K/uL 178 (E)   NEUT#  2.97 (E)   !: Data is abnormal (E): External lab result  ASSESSMENT & PLAN:  Assessment/Plan:  An 81 y.o. male with biopsy-proven recurrence of low-grade follicular lymphoma.  He also has a history of diffuse large B-cell lymphoma.  I am pleased as his peripheral counts are much better today.  He will proceed with his fifth cycle of Revlimid/Rituxan, with Revlimid to be given at 10 mg daily, every 3 out of 4 weeks.  Clinically, he is doing very well.  I will see him back in 4 weeks before he heads into his 6th and final cycle of Revlimid/Rituxan.  The patient understands all the plans discussed today and is in agreement with them.  Ryane Canavan Macarthur Critchley, MD

## 2022-02-13 ENCOUNTER — Inpatient Hospital Stay (INDEPENDENT_AMBULATORY_CARE_PROVIDER_SITE_OTHER): Payer: PPO | Admitting: Oncology

## 2022-02-13 ENCOUNTER — Telehealth: Payer: Self-pay | Admitting: Oncology

## 2022-02-13 ENCOUNTER — Inpatient Hospital Stay: Payer: PPO

## 2022-02-13 DIAGNOSIS — D649 Anemia, unspecified: Secondary | ICD-10-CM | POA: Diagnosis not present

## 2022-02-13 DIAGNOSIS — Z5112 Encounter for antineoplastic immunotherapy: Secondary | ICD-10-CM | POA: Diagnosis not present

## 2022-02-13 DIAGNOSIS — C8298 Follicular lymphoma, unspecified, lymph nodes of multiple sites: Secondary | ICD-10-CM

## 2022-02-13 LAB — CBC AND DIFFERENTIAL
HCT: 40 — AB (ref 41–53)
Hemoglobin: 13.5 (ref 13.5–17.5)
Neutrophils Absolute: 2.97
Platelets: 178 10*3/uL (ref 150–400)
WBC: 6.6

## 2022-02-13 LAB — LACTATE DEHYDROGENASE: LDH: 135 U/L (ref 98–192)

## 2022-02-13 LAB — BASIC METABOLIC PANEL
BUN: 17 (ref 4–21)
CO2: 30 — AB (ref 13–22)
Chloride: 104 (ref 99–108)
Creatinine: 1 (ref 0.6–1.3)
Glucose: 100
Potassium: 4.2 mEq/L (ref 3.5–5.1)
Sodium: 136 — AB (ref 137–147)

## 2022-02-13 LAB — HEPATIC FUNCTION PANEL
ALT: 15 U/L (ref 10–40)
AST: 16 (ref 14–40)
Alkaline Phosphatase: 61 (ref 25–125)
Bilirubin, Total: 0.7

## 2022-02-13 LAB — COMPREHENSIVE METABOLIC PANEL
Albumin: 4 (ref 3.5–5.0)
Calcium: 9.4 (ref 8.7–10.7)

## 2022-02-13 LAB — TSH: TSH: 2.156 u[IU]/mL (ref 0.350–4.500)

## 2022-02-13 LAB — CBC: RBC: 4.92 (ref 3.87–5.11)

## 2022-02-13 NOTE — Telephone Encounter (Signed)
Per 02/13/22 los next appt scheduled and confirmed with patient

## 2022-02-15 ENCOUNTER — Encounter: Payer: Self-pay | Admitting: Oncology

## 2022-02-19 ENCOUNTER — Inpatient Hospital Stay: Payer: PPO

## 2022-02-19 VITALS — BP 141/68 | HR 52 | Temp 98.1°F | Resp 20

## 2022-02-19 DIAGNOSIS — C8298 Follicular lymphoma, unspecified, lymph nodes of multiple sites: Secondary | ICD-10-CM

## 2022-02-19 DIAGNOSIS — Z5112 Encounter for antineoplastic immunotherapy: Secondary | ICD-10-CM | POA: Diagnosis not present

## 2022-02-19 MED ORDER — SODIUM CHLORIDE 0.9 % IV SOLN
375.0000 mg/m2 | Freq: Once | INTRAVENOUS | Status: AC
  Administered 2022-02-19: 800 mg via INTRAVENOUS
  Filled 2022-02-19: qty 50

## 2022-02-19 MED ORDER — DIPHENHYDRAMINE HCL 25 MG PO CAPS
50.0000 mg | ORAL_CAPSULE | Freq: Once | ORAL | Status: AC
  Administered 2022-02-19: 50 mg via ORAL
  Filled 2022-02-19: qty 2

## 2022-02-19 MED ORDER — ACETAMINOPHEN 325 MG PO TABS
650.0000 mg | ORAL_TABLET | Freq: Once | ORAL | Status: AC
  Administered 2022-02-19: 650 mg via ORAL
  Filled 2022-02-19: qty 2

## 2022-02-19 MED ORDER — SODIUM CHLORIDE 0.9% FLUSH
10.0000 mL | INTRAVENOUS | Status: DC | PRN
  Administered 2022-02-19: 10 mL

## 2022-02-19 MED ORDER — SODIUM CHLORIDE 0.9 % IV SOLN: Freq: Once | INTRAVENOUS | Status: AC

## 2022-02-19 MED ORDER — HEPARIN SOD (PORK) LOCK FLUSH 100 UNIT/ML IV SOLN
500.0000 [IU] | Freq: Once | INTRAVENOUS | Status: AC | PRN
  Administered 2022-02-19: 500 [IU]

## 2022-02-19 NOTE — Patient Instructions (Signed)
Rituximab; Hyaluronidase Injection What is this medication? RITUXIMAB; HYALURONIDASE (ri TUX i mab; hye al ur ON i dase) treats leukemia and lymphoma. Rituximab works by blocking a protein that causes cancer cells to grow and multiply. This helps to slow or stop the spread of cancer cells. Hyaluronidase works by increasing the absorption of the other medications in the body to help them work better. It is a combination medication that contains a monoclonal antibody. This medicine may be used for other purposes; ask your health care provider or pharmacist if you have questions. COMMON BRAND NAME(S): Rituxan Hycela What should I tell my care team before I take this medication? They need to know if you have any of these conditions: Heart disease Immune system problems Infection, such as hepatitis B, chickenpox, cold sores, herpes Irregular heartbeat Kidney disease Lung or breathing disease, such as asthma Recently received or scheduled to receive a vaccine An unusual or allergic reaction to rituximab, rituximab;hyaluronidase, mouse proteins, other medications, foods, dyes, or preservatives Pregnant or trying to get pregnant Breast-feeding How should I use this medication? This medication is for injection under the skin. It is given by a care team in a hospital or clinic setting. A special MedGuide will be given to you before each treatment. Be sure to read this information carefully each time. Talk to your care team about the use of this medication in children. Special care may be needed. Overdosage: If you think you have taken too much of this medicine contact a poison control center or emergency room at once. NOTE: This medicine is only for you. Do not share this medicine with others. What if I miss a dose? Keep appointments for follow-up doses. It is important not to miss your dose. Call your care team if you are unable to keep an appointment. What may interact with this medication? Do not  take this medication with any of the following: Live virus vaccines This medication may also interact with the following: Cisplatin This list may not describe all possible interactions. Give your health care provider a list of all the medicines, herbs, non-prescription drugs, or dietary supplements you use. Also tell them if you smoke, drink alcohol, or use illegal drugs. Some items may interact with your medicine. What should I watch for while using this medication? Your condition will be monitored carefully while you are receiving this medication. You may need blood work while taking this medication. This medication can cause serious allergic reactions. To reduce the risk, your care team may give you other medications to take before receiving this one. Be sure to follow the directions from your care team. In some patients, this medication may cause a serious brain infection that may cause death. If you have any problems seeing, thinking, speaking, walking, or standing, tell your care team right away. If you cannot reach your care team, urgently seek other source of medical care. This medication may increase your risk of getting an infection. Call your care team for advice if you get a fever, chills, sore throat, or other symptoms of a cold or flu. Do not treat yourself. Try to avoid being around people who are sick. Talk to your care team if you may be pregnant. Serious birth defects can occur if you take this medication during pregnancy and for 12 months after the last dose. Your will need a negative pregnancy test before starting this medication. Contraception is recommended while taking this medication and for 12 months after the last dose. Your care  team can help you find the option that works for you. Do not breastfeed while taking this medication and for at least 6 months after the last dose. What side effects may I notice from receiving this medication? Side effects that you should report to  your care team as soon as possible: Allergic reactions or angioedema--skin rash, itching or hives, swelling of the face, eyes, lips, tongue, arms, or legs, trouble swallowing or breathing Bowel blockage--stomach cramping, unable to have a bowel movement or pass gas, loss of appetite, vomiting Dizziness, loss of balance or coordination, confusion or trouble speaking Fever, chills, unusual weakness or fatigue, loss of appetite, nausea, headache, dizziness, feeling faint or lightheaded, shortness of breath, fast or irregular heartbeat, which may be signs of cytokine release syndrome Heart attack--pain or tightness in the chest, shoulders, arms, or jaw, nausea, shortness of breath, cold or clammy skin, feeling faint or lightheaded Heart rhythm changes--fast or irregular heartbeat, dizziness, feeling faint or lightheaded, chest pain, trouble breathing Infection--fever, chills, cough, sore throat, wounds that don't heal, pain or trouble when passing urine, general feeling of discomfort or being unwell Kidney injury--decrease in the amount of urine, swelling of the ankles, hands, or feet Liver injury--right upper belly pain, loss of appetite, nausea, light-colored stool, dark yellow or brown urine, yellowing skin or eyes, unusual weakness or fatigue Redness, blistering, peeling, or loosening of the skin, including inside the mouth Stomach pain that is severe, does not go away, or gets worse Tumor lysis syndrome (TLS)--nausea, vomiting, diarrhea, decrease in the amount of urine, dark urine, unusual weakness or fatigue, confusion, muscle pain or cramps, fast or irregular heartbeat, joint pain Side effects that usually do not require medical attention (report these to your care team if they continue or are bothersome): Constipation Fatigue Hair loss Nausea Pain, redness, or irritation at injection site This list may not describe all possible side effects. Call your doctor for medical advice about side  effects. You may report side effects to FDA at 1-800-FDA-1088. Where should I keep my medication? This medication is given in a hospital or clinic. It will not be stored at home. NOTE: This sheet is a summary. It may not cover all possible information. If you have questions about this medicine, talk to your doctor, pharmacist, or health care provider.  2023 Elsevier/Gold Standard (2021-10-29 00:00:00)

## 2022-02-19 NOTE — Progress Notes (Signed)
0930 Patient reports mild pain at port site- Blood return noted, no swelling or redness noted. Patient instructed to call for any continued to worsening pain

## 2022-02-19 NOTE — Progress Notes (Signed)
0945- Patient states that pain has improved, Blood return noted. No swelling or redness noted.

## 2022-03-12 NOTE — Progress Notes (Signed)
[facility]  [address] [place],  [place]  [address] [phone]  Clinic Day:  03/13/2022  Referring physician: [name], MD  HISTORY OF PRESENT ILLNESS:  The patient is an 81 y.o. male with recurrent low grade follicular lymphoma.  There is also history of diffuse large B cell lymphoma. The patient developed a new focus of biopsy-proven low-grade follicular lymphoma involving his right eyelid.  He comes in today to be evaluated before heading into his 6th cycle of Revlimid/Rituxan.  His current  Revlimid dose is now 10 mg daily, every 3 to 4 weeks.  The patient claims to have tolerated his 5th cycle of Revlimid/Rituxan.the only problem he complains of today is dry skin, which he attributes to his Revlimid.  He denies having any new lymphadenopathy or other changes which concern him for disease progression.  With respect to his lymphoma history, his diffuse large B-cell lymphoma was initially diagnosed in May 2000 per biopsy of a mass in his left thyroid.  As CT scans showed evidence of disease above and below his diaphragm, he was given 6 cycles of CHOP chemotherapy, followed by weekly Rituxan for 8 treatments.  His disease did well up until August 2008 when CT and PET scans showed evidence of disease recurrence.  At that time, a repeat biopsy showed follicular lymphoma, which led to him undergoing 6 cycles of Rituxan-CVP.  The plan was to continue maintenance Rituxan for 2 years, but the patient only received 1 cycle of maintenance Rituxan before discontinuing it in early 2009.  The patient has had biopsies and resections of a fleshy skin nodule behind his right ear, whose pathology revealed follicular lymphoma, but with focal areas within it suggesting potential transformation into a more aggressive subtype.  However, this area never flared up to where intervention has been necessary.  He recently had problems with his right eyelid, for which a biopsy  of the area confirmed the presence of recurrent low-grade follicular lymphoma.  This biopsy and worsening findings on a recent PET scan led to him being placed on Revlimid/Rituxan, which is the first systemic therapy he has received since 2009.  PHYSICAL EXAM:  Blood pressure (!) 155/68, pulse (!) 54, temperature 97.9 F (36.6 C), resp. rate 16, height '5\' 9"'$  (1.753 m), weight 198 lb 3.2 oz (89.9 kg), SpO2 99 %. Wt Readings from Last 3 Encounters:  03/13/22 198 lb 3.2 oz (89.9 kg)  02/13/22 195 lb 4.8 oz (88.6 kg)  01/30/22 194 lb 9.6 oz (88.3 kg)   Body mass index is 29.27 kg/m. Performance status (ECOG): 1 Physical Exam Constitutional:      Appearance: Normal appearance. He is not ill-appearing.  HENT:     Mouth/Throat:     Mouth: Mucous membranes are moist.     Pharynx: Oropharynx is clear. No oropharyngeal exudate or posterior oropharyngeal erythema.  Eyes:     Comments: No evidence of any lower right eyelid thickening, consistent with positive treatment response  Cardiovascular:     Rate and Rhythm: Normal rate and regular rhythm.     Heart sounds: No murmur heard.    No friction rub. No gallop.  Pulmonary:     Effort: Pulmonary effort is normal. No respiratory distress.     Breath sounds: Normal breath sounds. No wheezing, rhonchi or rales.  Abdominal:     General: Bowel sounds are normal. There is no distension.     Palpations: Abdomen is soft. There is no  mass.     Tenderness: There is no abdominal tenderness.  Musculoskeletal:        General: No swelling.     Right lower leg: No edema.     Left lower leg: No edema.  Lymphadenopathy:     Cervical: No cervical adenopathy.     Upper Body:     Right upper body: No supraclavicular or axillary adenopathy.     Left upper body: No supraclavicular or axillary adenopathy.     Lower Body: No right inguinal adenopathy. No left inguinal adenopathy.  Skin:    General: Skin is warm.     Coloration: Skin is not jaundiced.      Findings: No lesion or rash.     Comments: Diffusely dry skin with numerous brown-colored flat moles over most of his body  Neurological:     General: No focal deficit present.     Mental Status: He is alert and oriented to person, place, and time. Mental status is at baseline.  Psychiatric:        Mood and Affect: Mood normal.        Behavior: Behavior normal.        Thought Content: Thought content normal.    LABS:  Latest Reference Range & Units 03/13/22 00:00 03/13/22 09:25  Sodium 137 - 147  138 (E)   Potassium 3.5 - 5.1 mEq/L 4.0 (E)   Chloride 99 - 108  105 (E)   CO2 13 - 22  28 ! (E)   Glucose  97 (E)   BUN 4 - 21  20 (E)   Creatinine 0.6 - 1.3  1.0 (E)   Calcium 8.7 - 10.7  9.3 (E)   Alkaline Phosphatase 25 - 125  64 (E)   Albumin 3.5 - 5.0  4.1 (E)   AST 14 - 40  18 (E)   ALT 10 - 40 U/L 17 (E)   Bilirubin, Total  0.7 (E)   LDH 98 - 192 U/L  121  WBC  6.1 (E)   RBC 3.87 - 5.11  5.12 ! (E)   Hemoglobin 13.5 - 17.5  14.4 (E)   HCT 41 - 53  42 (E)   Platelets 150 - 400 K/uL 205 (E)   !: Data is abnormal (E): External lab result  ASSESSMENT & PLAN:  Assessment/Plan:  An 81 y.o. male with biopsy-proven recurrence of low-grade follicular lymphoma.  He also has a history of diffuse large B-cell lymphoma. He will proceed with his 6th and final cycle of Revlimid/Rituxan, with Revlimid now being  given at 10 mg daily, every 3 out of 4 weeks.  Clinically, he is doing very well.  I will see him back in 4 weeks for repeat clinical assessment.  A PET scan will be done a day before his next visit to ascertain his new disease baseline.  If there continues to be disease improvement, the patient understands he will receive 6 additional cycles of maintenance Revlimid by itself.  The patient understands all the plans discussed today and is in agreement with them.  Rubyann Lingle Macarthur Critchley, MD

## 2022-03-13 ENCOUNTER — Inpatient Hospital Stay: Payer: PPO

## 2022-03-13 ENCOUNTER — Inpatient Hospital Stay: Payer: PPO | Attending: Oncology | Admitting: Oncology

## 2022-03-13 VITALS — BP 155/68 | HR 54 | Temp 97.9°F | Resp 16 | Ht 69.0 in | Wt 198.2 lb

## 2022-03-13 DIAGNOSIS — C8291 Follicular lymphoma, unspecified, lymph nodes of head, face, and neck: Secondary | ICD-10-CM | POA: Diagnosis not present

## 2022-03-13 DIAGNOSIS — C8228 Follicular lymphoma grade III, unspecified, lymph nodes of multiple sites: Secondary | ICD-10-CM

## 2022-03-13 DIAGNOSIS — Z79899 Other long term (current) drug therapy: Secondary | ICD-10-CM | POA: Diagnosis not present

## 2022-03-13 DIAGNOSIS — C8298 Follicular lymphoma, unspecified, lymph nodes of multiple sites: Secondary | ICD-10-CM | POA: Diagnosis not present

## 2022-03-13 DIAGNOSIS — D649 Anemia, unspecified: Secondary | ICD-10-CM | POA: Diagnosis not present

## 2022-03-13 LAB — CBC AND DIFFERENTIAL
HCT: 42 (ref 41–53)
Hemoglobin: 14.4 (ref 13.5–17.5)
Neutrophils Absolute: 2.07
Platelets: 205 10*3/uL (ref 150–400)
WBC: 6.1

## 2022-03-13 LAB — TSH: TSH: 1.981 u[IU]/mL (ref 0.350–4.500)

## 2022-03-13 LAB — BASIC METABOLIC PANEL
BUN: 20 (ref 4–21)
CO2: 28 — AB (ref 13–22)
Chloride: 105 (ref 99–108)
Creatinine: 1 (ref 0.6–1.3)
Glucose: 97
Potassium: 4 mEq/L (ref 3.5–5.1)
Sodium: 138 (ref 137–147)

## 2022-03-13 LAB — LACTATE DEHYDROGENASE: LDH: 121 U/L (ref 98–192)

## 2022-03-13 LAB — CBC: RBC: 5.12 — AB (ref 3.87–5.11)

## 2022-03-13 LAB — HEPATIC FUNCTION PANEL
ALT: 17 U/L (ref 10–40)
AST: 18 (ref 14–40)
Alkaline Phosphatase: 64 (ref 25–125)
Bilirubin, Total: 0.7

## 2022-03-13 LAB — COMPREHENSIVE METABOLIC PANEL
Albumin: 4.1 (ref 3.5–5.0)
Calcium: 9.3 (ref 8.7–10.7)

## 2022-03-14 ENCOUNTER — Other Ambulatory Visit: Payer: Self-pay

## 2022-03-14 ENCOUNTER — Encounter: Payer: Self-pay | Admitting: Oncology

## 2022-03-16 ENCOUNTER — Encounter: Payer: Self-pay | Admitting: Oncology

## 2022-03-21 ENCOUNTER — Other Ambulatory Visit: Payer: Self-pay

## 2022-03-26 ENCOUNTER — Other Ambulatory Visit: Payer: Self-pay | Admitting: Oncology

## 2022-03-26 ENCOUNTER — Other Ambulatory Visit: Payer: Self-pay

## 2022-03-26 DIAGNOSIS — C8298 Follicular lymphoma, unspecified, lymph nodes of multiple sites: Secondary | ICD-10-CM

## 2022-03-26 DIAGNOSIS — E7800 Pure hypercholesterolemia, unspecified: Secondary | ICD-10-CM | POA: Insufficient documentation

## 2022-03-26 DIAGNOSIS — C8590 Non-Hodgkin lymphoma, unspecified, unspecified site: Secondary | ICD-10-CM | POA: Insufficient documentation

## 2022-03-26 MED ORDER — LENALIDOMIDE 10 MG PO CAPS
ORAL_CAPSULE | ORAL | 4 refills | Status: DC
  Filled 2022-03-26: qty 21, fill #0

## 2022-03-27 ENCOUNTER — Telehealth: Payer: Self-pay

## 2022-03-27 ENCOUNTER — Other Ambulatory Visit (HOSPITAL_COMMUNITY): Payer: Self-pay

## 2022-03-27 DIAGNOSIS — C8298 Follicular lymphoma, unspecified, lymph nodes of multiple sites: Secondary | ICD-10-CM

## 2022-03-27 MED ORDER — LENALIDOMIDE 10 MG PO CAPS: ORAL_CAPSULE | ORAL | 4 refills | Status: DC

## 2022-03-27 NOTE — Telephone Encounter (Signed)
Oral Oncology Pharmacist Encounter  Prescription refill for Revlimid sent to [facility] in error. Patient enrolled in manufacturer assistance and receives medication through [facility]. Prescription redirected to [facility] by [employer].  Drema Halon, PharmD Hematology/Oncology Clinical Pharmacist [facility] [phone] 03/27/2022 9:45 AM

## 2022-03-30 ENCOUNTER — Encounter: Payer: Self-pay | Admitting: Cardiology

## 2022-03-30 ENCOUNTER — Ambulatory Visit: Payer: PPO | Attending: Cardiology | Admitting: Cardiology

## 2022-03-30 VITALS — BP 138/62 | HR 56 | Ht 69.0 in | Wt 198.4 lb

## 2022-03-30 DIAGNOSIS — I10 Essential (primary) hypertension: Secondary | ICD-10-CM | POA: Diagnosis not present

## 2022-03-30 DIAGNOSIS — I428 Other cardiomyopathies: Secondary | ICD-10-CM | POA: Diagnosis not present

## 2022-03-30 DIAGNOSIS — E782 Mixed hyperlipidemia: Secondary | ICD-10-CM

## 2022-03-30 NOTE — Patient Instructions (Signed)
Medication Instructions:  Your physician recommends that you continue on your current medications as directed. Please refer to the Current Medication list given to you today.  *If you need a refill on your cardiac medications before your next appointment, please call your pharmacy*   Lab Work: None ordered If you have labs (blood work) drawn today and your tests are completely normal, you will receive your results only by: MyChart Message (if you have MyChart) OR A paper copy in the mail If you have any lab test that is abnormal or we need to change your treatment, we will call you to review the results.   Testing/Procedures: None ordered   Follow-Up: At [facility], you and your health needs are our priority.  As part of our continuing mission to provide you with exceptional heart care, we have created designated Provider Care Teams.  These Care Teams include your primary Cardiologist (physician) and Advanced Practice Providers (APPs -  Physician Assistants and Nurse Practitioners) who all work together to provide you with the care you need, when you need it.  We recommend signing up for the patient portal called "MyChart".  Sign up information is provided on this After Visit Summary.  MyChart is used to connect with patients for Virtual Visits (Telemedicine).  Patients are able to view lab/test results, encounter notes, upcoming appointments, etc.  Non-urgent messages can be sent to your provider as well.   To learn more about what you can do with MyChart, go to [email or web address].    Your next appointment:   4 month(s)  The format for your next appointment:   In Person  Provider:   Rajan Revankar, MD   Other Instructions NA  

## 2022-03-30 NOTE — Progress Notes (Signed)
Cardiology Office Note:    Date:  03/30/2022   ID:  Sean Hodges, DOB April 23, 1941, MRN [id number]  PCP:  [name], MD  Cardiologist:  Jenean Lindau, MD   Referring MD: [name], MD    ASSESSMENT:    1. Nonischemic cardiomyopathy ([place])   2. Primary hypertension   3. Mixed hyperlipidemia    PLAN:    In order of problems listed above:  Primary prevention stressed with the patient.  Importance of compliance with diet medication stressed any vocalized understanding. History of cardiomyopathy: Last ejection fraction was normal.  Recent evaluation at [facility] hospital and records were reviewed by me at length.  Questions were answered to his satisfaction. History of pulmonary embolism: Patient mentions to me that he feels that his pulmonary embolism is from chemotherapeutic agents.  He wants to stop anticoagulation once this is done.  I told him that we will revisit this in follow-up appointment in 4 months when he will be done with the chemotherapy.  I also told him to discuss this with his oncologist at extensive length of any vocalized understanding.  Questions were answered to his satisfaction. Essential hypertension: Blood pressure stable and diet was emphasized.  Lifestyle modification urged. Mixed dyslipidemia: Diet emphasized.  Patient on statin therapy followed by primary care   Medication Adjustments/Labs and Tests Ordered: Current medicines are reviewed at length with the patient today.  Concerns regarding medicines are outlined above.  No orders of the defined types were placed in this encounter.  No orders of the defined types were placed in this encounter.    No chief complaint on file.    History of Present Illness:    Sean Hodges is a 81 y.o. male.  Patient has past medical history of nonischemic cardiomyopathy.  He was followed by my partner in [place].  Subsequently has developed lymphoma and has had chemotherapy.  He  had pulmonary embolism while on chemotherapy.  He is followed by his oncologist for the same.  He denies any chest pain orthopnea PND.  He takes care of activities of daily living.  He is here to be established.  Past Medical History:  Diagnosis Date   Arthritis    knee osteoarthritis   Benign prostatic hyperplasia 07/03/2016   Cancer ([place])    nhl   Chronic pain of left knee 11/15/2014   DDD (degenerative disc disease), lumbar 07/03/2016   Disease of thyroid gland 07/03/2016   Overview:  Left Thyroidectomy (original site of lymphoma)   Dyspnea on exertion 3/00/7622   Follicular lymphoma ([place]) 07/29/2011   Diagnosed May 2000.  Stage IV--thyroid involvement.   GERD (gastroesophageal reflux disease) 07/03/2016   Hypercholesterolemia    Hyperlipidemia    Hypertension    Left knee pain 11/15/2014   Loose body of left knee 11/20/2014   Lumbar stenosis 07/03/2016   Lumbar stenosis with neurogenic claudication 09/21/2017   Lymphoma ([place])    Neuropathy due to chemotherapeutic drug ([place]) 09/17/2013   Nonischemic cardiomyopathy ([place]) 03/07/2015   Port catheter in place 10/29/2015   Port-A-Cath in place 05/01/2020   Postoperative urinary retention 10/05/2017   Precordial chest pain 08/30/2014   S/P left knee arthroscopy 11/26/2014   Spondylosis of lumbar joint 07/03/2016    Past Surgical History:  Procedure Laterality Date   arthroscopies Bilateral    Left eye surgery  2013   THYROIDECTOMY, PARTIAL Left    vocal cord     polyps removed  Current Medications: Current Meds  Medication Sig   Acetaminophen (TYLENOL PO) Take 350 mg by mouth every morning.   apixaban (ELIQUIS) 5 MG TABS tablet Take 5 mg by mouth 2 (two) times daily.   aspirin EC 81 MG tablet Take 81 mg by mouth daily.   calcium & magnesium carbonates (MYLANTA) 311-232 MG per tablet Take 1 tablet by mouth 2 (two) times daily.   Cholecalciferol (VITAMIN D3 PO) Take 500 Units by mouth 2 (two) times daily.   COENZYME Q-10 PO Take 50 mg by  mouth 2 (two) times daily.    enalapril (VASOTEC) 10 MG tablet Take 1 tablet by mouth daily.   famotidine (PEPCID) 20 MG tablet Take 20 mg by mouth 2 (two) times daily.   fish oil-omega-3 fatty acids 1000 MG capsule Take 1 g by mouth daily.   fluorouracil (EFUDEX) 5 % cream Apply 1 Application topically 2 (two) times daily.   glucosamine-chondroitin 500-400 MG tablet Take 1 tablet by mouth 2 (two) times daily.    hydrOXYzine (VISTARIL) 25 MG capsule Take 1 capsule (25 mg total) by mouth 3 (three) times daily as needed.   lenalidomide (REVLIMID) 10 MG capsule Take 1 tablet ('10mg'$ ) by mouth daily for 21 days then off for 7 days. Repeat every 28 days.   lidocaine (LIDODERM) 5 % Place 1 patch onto the skin as needed for pain.   ondansetron (ZOFRAN) 4 MG tablet Take 1 tablet (4 mg total) by mouth every 4 (four) hours as needed for nausea.   pravastatin (PRAVACHOL) 80 MG tablet Take 80 mg by mouth daily.    prochlorperazine (COMPAZINE) 10 MG tablet Take 1 tablet (10 mg total) by mouth every 6 (six) hours as needed for nausea or vomiting.   saw palmetto 500 MG capsule Take 500 mg by mouth 2 (two) times daily.   vitamin B-12 (CYANOCOBALAMIN) 1000 MCG tablet Take 1,000 mcg by mouth daily.    Current Facility-Administered Medications for the 03/30/22 encounter (Office Visit) with Leotis Isham, Reita Cliche, MD  Medication   triamcinolone acetonide (KENALOG-40) injection 20 mg     Allergies:   Gabapentin and Lipitor [atorvastatin]   Social History   Socioeconomic History   Marital status: Married    Spouse name: Not on file   Number of children: 3   Years of education: Not on file   Highest education level: Not on file  Occupational History   Occupation: retired-engineer  Tobacco Use   Smoking status: Former    Types: Cigarettes    Quit date: 01/20/1974    Years since quitting: 48.2   Smokeless tobacco: Former    Quit date: 06/22/1973  Vaping Use   Vaping Use: Never used  Substance and Sexual Activity    Alcohol use: Never   Drug use: Never   Sexual activity: Not on file  Other Topics Concern   Not on file  Social History Narrative   Patient is very active, walks 3 miles a day, former Psychologist, prison and probation services, 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 3 children [place] in [place], 2 in [place]   Social Determinants of Health   Financial Resource Strain: Not on file  Food Insecurity: Not on file  Transportation Needs: Not on file  Physical Activity: Not on file  Stress: Not on file  Social Connections: Not on file     Family History: The patient's family history includes Cancer in his brother, brother, brother, brother, and  brother.  ROS:   Please see the history of present illness.    All other systems reviewed and are negative.  EKGs/Labs/Other Studies Reviewed:    The following studies were reviewed today: EKG reveals sinus, nonspecific ST-T changes   Recent Labs: 03/13/2022: ALT 17; BUN 20; Creatinine 1.0; Hemoglobin 14.4; Platelets 205; Potassium 4.0; Sodium 138; TSH 1.981  Recent Lipid Panel    Component Value Date/Time   CHOL 152 08/30/2014 1028   TRIG 90 08/30/2014 1028   HDL 39 (L) 08/30/2014 1028   CHOLHDL 3.9 08/30/2014 1028   VLDL 18 08/30/2014 1028   LDLCALC 95 08/30/2014 1028    Physical Exam:    VS:  BP 138/62   Pulse (!) 56   Ht '5\' 9"'$  (1.753 m)   Wt 198 lb 6.4 oz (90 kg)   SpO2 98%   BMI 29.30 kg/m     Wt Readings from Last 3 Encounters:  03/30/22 198 lb 6.4 oz (90 kg)  03/13/22 198 lb 3.2 oz (89.9 kg)  02/13/22 195 lb 4.8 oz (88.6 kg)     GEN: Patient is in no acute distress HEENT: Normal NECK: No JVD; No carotid bruits LYMPHATICS: No lymphadenopathy CARDIAC: Hear sounds regular, 2/6 systolic murmur at the apex. RESPIRATORY:  Clear to auscultation without rales, wheezing or rhonchi  ABDOMEN: Soft, non-tender, non-distended MUSCULOSKELETAL:  No edema; No deformity  SKIN: Warm and dry NEUROLOGIC:   Alert and oriented x 3 PSYCHIATRIC:  Normal affect   Signed, Jenean Lindau, MD  03/30/2022 10:18 AM    [facility]

## 2022-03-31 ENCOUNTER — Other Ambulatory Visit: Payer: Self-pay

## 2022-04-03 DIAGNOSIS — C44729 Squamous cell carcinoma of skin of left lower limb, including hip: Secondary | ICD-10-CM | POA: Diagnosis not present

## 2022-04-03 DIAGNOSIS — C44722 Squamous cell carcinoma of skin of right lower limb, including hip: Secondary | ICD-10-CM | POA: Diagnosis not present

## 2022-04-03 DIAGNOSIS — L570 Actinic keratosis: Secondary | ICD-10-CM | POA: Diagnosis not present

## 2022-04-08 ENCOUNTER — Ambulatory Visit (HOSPITAL_COMMUNITY)
Admission: RE | Admit: 2022-04-08 | Discharge: 2022-04-08 | Disposition: A | Discharge status: Home / Self Care | Payer: PPO | Source: Ambulatory Visit | Attending: Oncology | Admitting: Oncology

## 2022-04-08 DIAGNOSIS — I2510 Atherosclerotic heart disease of native coronary artery without angina pectoris: Secondary | ICD-10-CM | POA: Diagnosis not present

## 2022-04-08 DIAGNOSIS — C8228 Follicular lymphoma grade III, unspecified, lymph nodes of multiple sites: Secondary | ICD-10-CM | POA: Insufficient documentation

## 2022-04-08 DIAGNOSIS — J984 Other disorders of lung: Secondary | ICD-10-CM | POA: Diagnosis not present

## 2022-04-08 DIAGNOSIS — C8238 Follicular lymphoma grade IIIa, lymph nodes of multiple sites: Secondary | ICD-10-CM | POA: Diagnosis not present

## 2022-04-08 DIAGNOSIS — I6523 Occlusion and stenosis of bilateral carotid arteries: Secondary | ICD-10-CM | POA: Diagnosis not present

## 2022-04-08 DIAGNOSIS — C8290 Follicular lymphoma, unspecified, unspecified site: Secondary | ICD-10-CM | POA: Diagnosis not present

## 2022-04-08 LAB — GLUCOSE, CAPILLARY: Glucose-Capillary: 115 mg/dL — ABNORMAL HIGH (ref 70–99)

## 2022-04-08 MED ORDER — FLUDEOXYGLUCOSE F - 18 (FDG) INJECTION
9.8400 | Freq: Once | INTRAVENOUS | Status: AC
  Administered 2022-04-08: 9.84 via INTRAVENOUS

## 2022-04-08 NOTE — Progress Notes (Signed)
[facility]  [address] [place],  [place]  [address] [phone]  Clinic Day:  04/09/2022  Referring physician: [name], MD  HISTORY OF PRESENT ILLNESS:  The patient is an 81 y.o. male with recurrent low grade follicular lymphoma.  There is also history of diffuse large B cell lymphoma.  He comes in today to go over his PET scan to ascertain his new disease baseline after completing his 6 cycles of Revlimid/Rituxan.  His Revlimid dose has been 10 mg daily, every 3 to 4 weeks.  The patient claims to have tolerated his 6th cycle of Revlimid/Rituxan okay.  He has had increased neuropathy in his feet, as well as worsening arthritis.  Although uncertain, he does believe his Revlimid may be factoring into some of these symptoms.  As it pertains to his disease, he denies having any B symptoms or new lymphadenopathy which concerns him for disease progression.  With respect to his lymphoma history, his diffuse large B-cell lymphoma was initially diagnosed in May 2000 per biopsy of a mass in his left thyroid.  As CT scans showed evidence of disease above and below his diaphragm, he was given 6 cycles of CHOP chemotherapy, followed by weekly Rituxan for 8 treatments.  His disease did well up until August 2008 when CT and PET scans showed evidence of disease recurrence.  At that time, a repeat biopsy showed follicular lymphoma, which led to him undergoing 6 cycles of Rituxan-CVP.  The plan was to continue maintenance Rituxan for 2 years, but the patient only received 1 cycle of maintenance Rituxan before discontinuing it in early 2009.  The patient has had biopsies and resections of a fleshy skin nodule behind his right ear, whose pathology revealed follicular lymphoma, but with focal areas within it suggesting potential transformation into a more aggressive subtype.  However, this area never flared up to where intervention has been necessary.  He recently had  problems with his right eyelid, for which a biopsy of the area confirmed the presence of recurrent low-grade follicular lymphoma.  This biopsy and worsening findings on a recent PET scan led to him being placed on Revlimid/Rituxan, which was his first systemic therapy since 2009.  PHYSICAL EXAM  Blood pressure (!) 139/91, pulse 66, temperature 97.7 F (36.5 C), resp. rate 16, height '5\' 9"'$  (1.753 m), weight 199 lb (90.3 kg), SpO2 92 %. Wt Readings from Last 3 Encounters:  04/09/22 199 lb (90.3 kg)  03/30/22 198 lb 6.4 oz (90 kg)  03/13/22 198 lb 3.2 oz (89.9 kg)   Body mass index is 29.39 kg/m. Performance status (ECOG): 1 Physical Exam Constitutional:      Appearance: Normal appearance. He is not ill-appearing.  HENT:     Mouth/Throat:     Mouth: Mucous membranes are moist.     Pharynx: Oropharynx is clear. No oropharyngeal exudate or posterior oropharyngeal erythema.  Eyes:     Comments: No evidence of any lower right eyelid thickening, consistent with positive treatment response  Cardiovascular:     Rate and Rhythm: Normal rate and regular rhythm.     Heart sounds: No murmur heard.    No friction rub. No gallop.  Pulmonary:     Effort: Pulmonary effort is normal. No respiratory distress.     Breath sounds: Normal breath sounds. No wheezing, rhonchi or rales.  Abdominal:     General: Bowel sounds are normal. There is no distension.     Palpations: Abdomen  is soft. There is no mass.     Tenderness: There is no abdominal tenderness.  Musculoskeletal:        General: No swelling.     Right lower leg: No edema.     Left lower leg: No edema.  Lymphadenopathy:     Cervical: No cervical adenopathy.     Upper Body:     Right upper body: No supraclavicular or axillary adenopathy.     Left upper body: No supraclavicular or axillary adenopathy.     Lower Body: No right inguinal adenopathy. No left inguinal adenopathy.  Skin:    General: Skin is warm.     Coloration: Skin is not  jaundiced.     Findings: No lesion or rash.     Comments: Diffusely dry skin with numerous brown-colored flat moles over most of his body  Neurological:     General: No focal deficit present.     Mental Status: He is alert and oriented to person, place, and time. Mental status is at baseline.  Psychiatric:        Mood and Affect: Mood normal.        Behavior: Behavior normal.        Thought Content: Thought content normal.   SCANS:  His PET scan revealed the following: FINDINGS: Mediastinal blood pool activity: SUV max 2.9  Liver activity: SUV max 4.3  NECK:  No hypermetabolic cervical lymph nodes are identified.Physiologic activity within the muscles of phonation.No suspicious activity identified within the pharyngeal mucosal space.  Incidental CT findings: Bilateral carotid atherosclerosis.  CHEST:  There are no hypermetabolic mediastinal, hilar or axillary lymph nodes. No hypermetabolic pulmonary activity or suspicious nodularity.  Incidental CT findings: Right subclavian Port-A-Cath extends to the superior cavoatrial junction. Atherosclerosis of the aorta, great vessels and coronary arteries. Stable mild linear scarring at both lung bases.  ABDOMEN/PELVIS:  There is no hypermetabolic activity within the liver, adrenal glands, spleen or pancreas. There is no hypermetabolic nodal activity in the abdomen or pelvis. Residual left periaortic node measuring 8 mm on image 143/4 has an SUV max of 2.4 (previously 1.1 cm, SUV max 2.4). Previously described low level metabolic activity associated with matted soft tissue around the iliac chains is currently difficult to differentiate from adjacent iliac vessels, and no suspicious metabolic activity is seen in this area (SUV max 4.0, previously 4.2).  Incidental CT findings: Hepatic steatosis, a right renal cyst (no follow-up imaging recommended) and aortic and branch vessel atherosclerosis are noted.  SKELETON:  There is  no hypermetabolic activity to suggest osseous metastatic disease.  Incidental CT findings: Previous right total hip arthroplasty and lower lumbar fusion.  IMPRESSION: 1. No evidence of recurrent metabolically active lymphoma ([place] 2). The previously demonstrated low level metabolic activity associated with matted soft tissue around the iliac chains is less conspicuous, and no suspicious metabolic activity is seen in this area, attributed to treated disease. 2. No new sites of involvement. No solid organ or bone marrow involvement. 3. Coronary and Aortic Atherosclerosis (ICD10-I70.0).  LABS:   ASSESSMENT & PLAN:  Assessment/Plan:  An 80 y.o. male with biopsy-proven recurrence of low-grade follicular lymphoma.  He also has a history of diffuse large B-cell lymphoma.  In clinic today, I went over all of his PET scan images with him, for which he could see that all of his previously hypermetabolic disease has been eradicated from his 6 cycles of Revlimid/Rituxan.  Understandably, the patient was pleased with his PET scan images.  Initially, the plan was for this gentleman to proceed with 6 additional cycles of maintenance Revlimid.  However, as the patient's quality of life has declined and he wishes to focus on improving this, he wishes to hold his maintenance Revlimid treatments.  Due to his clean PET scan and his age, I have no problem with him considering this.  Moving forward, I will follow him every 3 months with labs and physical exams to ensure there is no evidence of disease recurrence.  I also anticipate having him undergo CT scans every 6 months for his radiographic lymphoma surveillance.  With respect to his anticoagulation management, the patient knows to take his Eliquis until the last day of December 2023.  Afterwards, it will be discontinued.  I will see this patient back in 3 months for repeat clinical assessment.  The patient understands all the plans discussed today and is in  agreement with them.  Randalyn Ahmed Macarthur Critchley, MD

## 2022-04-08 NOTE — Progress Notes (Incomplete)
Sean Hodges  [address] [place],  [place]  [address] [phone]  Clinic Day:  03/13/2022  Referring physician: [name], MD  HISTORY OF PRESENT ILLNESS:  The patient is an 81 y.o. male with recurrent low grade follicular lymphoma.  There is also history of diffuse large B cell lymphoma. The patient developed a new focus of biopsy-proven low-grade follicular lymphoma involving his right eyelid.  He comes in today to be evaluated before heading into his 6th cycle of Revlimid/Rituxan.  His current  Revlimid dose is now 10 mg daily, every 3 to 4 weeks.  The patient claims to have tolerated his 5th cycle of Revlimid/Rituxan.the only problem he complains of today is dry skin, which he attributes to his Revlimid.  He denies having any new lymphadenopathy or other changes which concern him for disease progression.  With respect to his lymphoma history, his diffuse large B-cell lymphoma was initially diagnosed in May 2000 per biopsy of a mass in his left thyroid.  As CT scans showed evidence of disease above and below his diaphragm, he was given 6 cycles of CHOP chemotherapy, followed by weekly Rituxan for 8 treatments.  His disease did well up until August 2008 when CT and PET scans showed evidence of disease recurrence.  At that time, a repeat biopsy showed follicular lymphoma, which led to him undergoing 6 cycles of Rituxan-CVP.  The plan was to continue maintenance Rituxan for 2 years, but the patient only received 1 cycle of maintenance Rituxan before discontinuing it in early 2009.  The patient has had biopsies and resections of a fleshy skin nodule behind his right ear, whose pathology revealed follicular lymphoma, but with focal areas within it suggesting potential transformation into a more aggressive subtype.  However, this area never flared up to where intervention has been necessary.  He recently had problems with his right eyelid, for which a biopsy  of the area confirmed the presence of recurrent low-grade follicular lymphoma.  This biopsy and worsening findings on a recent PET scan led to him being placed on Revlimid/Rituxan, which is the first systemic therapy he has received since 2009.  PHYSICAL EXAM:  There were no vitals taken for this visit. Wt Readings from Last 3 Encounters:  03/30/22 198 lb 6.4 oz (90 kg)  03/13/22 198 lb 3.2 oz (89.9 kg)  02/13/22 195 lb 4.8 oz (88.6 kg)   There is no height or weight on file to calculate BMI. Performance status (ECOG): 1 Physical Exam Constitutional:      Appearance: Normal appearance. He is not ill-appearing.  HENT:     Mouth/Throat:     Mouth: Mucous membranes are moist.     Pharynx: Oropharynx is clear. No oropharyngeal exudate or posterior oropharyngeal erythema.  Eyes:     Comments: No evidence of any lower right eyelid thickening, consistent with positive treatment response  Cardiovascular:     Rate and Rhythm: Normal rate and regular rhythm.     Heart sounds: No murmur heard.    No friction rub. No gallop.  Pulmonary:     Effort: Pulmonary effort is normal. No respiratory distress.     Breath sounds: Normal breath sounds. No wheezing, rhonchi or rales.  Abdominal:     General: Bowel sounds are normal. There is no distension.     Palpations: Abdomen is soft. There is no mass.     Tenderness: There is no abdominal tenderness.  Musculoskeletal:  General: No swelling.     Right lower leg: No edema.     Left lower leg: No edema.  Lymphadenopathy:     Cervical: No cervical adenopathy.     Upper Body:     Right upper body: No supraclavicular or axillary adenopathy.     Left upper body: No supraclavicular or axillary adenopathy.     Lower Body: No right inguinal adenopathy. No left inguinal adenopathy.  Skin:    General: Skin is warm.     Coloration: Skin is not jaundiced.     Findings: No lesion or rash.     Comments: Diffusely dry skin with numerous brown-colored  flat moles over most of his body  Neurological:     General: No focal deficit present.     Mental Status: He is alert and oriented to person, place, and time. Mental status is at baseline.  Psychiatric:        Mood and Affect: Mood normal.        Behavior: Behavior normal.        Thought Content: Thought content normal.    LABS:  Latest Reference Range & Units 03/13/22 00:00 03/13/22 09:25  Sodium 137 - 147  138 (E)   Potassium 3.5 - 5.1 mEq/L 4.0 (E)   Chloride 99 - 108  105 (E)   CO2 13 - 22  28 ! (E)   Glucose  97 (E)   BUN 4 - 21  20 (E)   Creatinine 0.6 - 1.3  1.0 (E)   Calcium 8.7 - 10.7  9.3 (E)   Alkaline Phosphatase 25 - 125  64 (E)   Albumin 3.5 - 5.0  4.1 (E)   AST 14 - 40  18 (E)   ALT 10 - 40 U/L 17 (E)   Bilirubin, Total  0.7 (E)   LDH 98 - 192 U/L  121  WBC  6.1 (E)   RBC 3.87 - 5.11  5.12 ! (E)   Hemoglobin 13.5 - 17.5  14.4 (E)   HCT 41 - 53  42 (E)   Platelets 150 - 400 K/uL 205 (E)   !: Data is abnormal (E): External lab result  ASSESSMENT & PLAN:  Assessment/Plan:  An 81 y.o. male with biopsy-proven recurrence of low-grade follicular lymphoma.  He also has a history of diffuse large B-cell lymphoma. He will proceed with his 6th and final cycle of Revlimid/Rituxan, with Revlimid now being  given at 10 mg daily, every 3 out of 4 weeks.  Clinically, he is doing very well.  I will see him back in 4 weeks for repeat clinical assessment.  A PET scan will be done a day before his next visit to ascertain his new disease baseline.  If there continues to be disease improvement, the patient understands he will receive 6 additional cycles of maintenance Revlimid by itself.  The patient understands all the plans discussed today and is in agreement with them.  Sean Mckillop Macarthur Critchley, MD

## 2022-04-09 ENCOUNTER — Inpatient Hospital Stay: Payer: PPO

## 2022-04-09 ENCOUNTER — Ambulatory Visit: Payer: PPO | Admitting: Oncology

## 2022-04-09 ENCOUNTER — Other Ambulatory Visit: Payer: Self-pay | Admitting: Oncology

## 2022-04-09 ENCOUNTER — Telehealth: Payer: Self-pay | Admitting: Oncology

## 2022-04-09 ENCOUNTER — Inpatient Hospital Stay: Payer: PPO | Attending: Oncology | Admitting: Oncology

## 2022-04-09 VITALS — BP 139/91 | HR 66 | Temp 97.7°F | Resp 16 | Ht 69.0 in | Wt 199.0 lb

## 2022-04-09 DIAGNOSIS — C8228 Follicular lymphoma grade III, unspecified, lymph nodes of multiple sites: Secondary | ICD-10-CM | POA: Diagnosis not present

## 2022-04-09 DIAGNOSIS — C8291 Follicular lymphoma, unspecified, lymph nodes of head, face, and neck: Secondary | ICD-10-CM | POA: Insufficient documentation

## 2022-04-09 DIAGNOSIS — Z79899 Other long term (current) drug therapy: Secondary | ICD-10-CM | POA: Diagnosis not present

## 2022-04-09 DIAGNOSIS — C8288 Other types of follicular lymphoma, lymph nodes of multiple sites: Secondary | ICD-10-CM

## 2022-04-09 DIAGNOSIS — C8298 Follicular lymphoma, unspecified, lymph nodes of multiple sites: Secondary | ICD-10-CM

## 2022-04-09 DIAGNOSIS — D649 Anemia, unspecified: Secondary | ICD-10-CM | POA: Diagnosis not present

## 2022-04-09 LAB — CBC AND DIFFERENTIAL
HCT: 42 (ref 41–53)
Hemoglobin: 14.4 (ref 13.5–17.5)
Neutrophils Absolute: 2.27
Platelets: 180 10*3/uL (ref 150–400)
WBC: 5.4

## 2022-04-09 LAB — CBC: RBC: 5.14 — AB (ref 3.87–5.11)

## 2022-04-09 LAB — COMPREHENSIVE METABOLIC PANEL
Albumin: 4 (ref 3.5–5.0)
Calcium: 9.4 (ref 8.7–10.7)

## 2022-04-09 LAB — TSH: TSH: 1.828 u[IU]/mL (ref 0.350–4.500)

## 2022-04-09 LAB — HEPATIC FUNCTION PANEL
ALT: 19 U/L (ref 10–40)
AST: 17 (ref 14–40)
Alkaline Phosphatase: 55 (ref 25–125)
Bilirubin, Total: 0.6

## 2022-04-09 LAB — BASIC METABOLIC PANEL
BUN: 23 — AB (ref 4–21)
CO2: 29 — AB (ref 13–22)
Chloride: 104 (ref 99–108)
Creatinine: 1.1 (ref 0.6–1.3)
Glucose: 110
Potassium: 3.9 mEq/L (ref 3.5–5.1)
Sodium: 138 (ref 137–147)

## 2022-04-09 LAB — LACTATE DEHYDROGENASE: LDH: 135 U/L (ref 98–192)

## 2022-04-09 NOTE — Telephone Encounter (Signed)
04/09/22 Next appt scheduled and confirmed with patient 

## 2022-04-29 DIAGNOSIS — D0471 Carcinoma in situ of skin of right lower limb, including hip: Secondary | ICD-10-CM | POA: Diagnosis not present

## 2022-04-29 DIAGNOSIS — C44729 Squamous cell carcinoma of skin of left lower limb, including hip: Secondary | ICD-10-CM | POA: Diagnosis not present

## 2022-05-29 DIAGNOSIS — G629 Polyneuropathy, unspecified: Secondary | ICD-10-CM | POA: Diagnosis not present

## 2022-05-29 DIAGNOSIS — I428 Other cardiomyopathies: Secondary | ICD-10-CM | POA: Diagnosis not present

## 2022-05-29 DIAGNOSIS — Z86711 Personal history of pulmonary embolism: Secondary | ICD-10-CM | POA: Diagnosis not present

## 2022-05-29 DIAGNOSIS — N529 Male erectile dysfunction, unspecified: Secondary | ICD-10-CM | POA: Diagnosis not present

## 2022-05-29 DIAGNOSIS — Z6828 Body mass index (BMI) 28.0-28.9, adult: Secondary | ICD-10-CM | POA: Diagnosis not present

## 2022-05-29 DIAGNOSIS — M8580 Other specified disorders of bone density and structure, unspecified site: Secondary | ICD-10-CM | POA: Diagnosis not present

## 2022-05-29 DIAGNOSIS — M1990 Unspecified osteoarthritis, unspecified site: Secondary | ICD-10-CM | POA: Diagnosis not present

## 2022-05-29 DIAGNOSIS — E7800 Pure hypercholesterolemia, unspecified: Secondary | ICD-10-CM | POA: Diagnosis not present

## 2022-06-29 DIAGNOSIS — L578 Other skin changes due to chronic exposure to nonionizing radiation: Secondary | ICD-10-CM | POA: Diagnosis not present

## 2022-06-29 DIAGNOSIS — C44629 Squamous cell carcinoma of skin of left upper limb, including shoulder: Secondary | ICD-10-CM | POA: Diagnosis not present

## 2022-06-29 DIAGNOSIS — L570 Actinic keratosis: Secondary | ICD-10-CM | POA: Diagnosis not present

## 2022-07-09 NOTE — Progress Notes (Signed)
[facility]  [address] [place],  [place]  [address] [phone]  Clinic Day:  07/10/2022  Referring physician: [name], MD  HISTORY OF PRESENT ILLNESS:  The patient is an 82 y.o. male with recurrent low grade follicular lymphoma.  There is also history of diffuse large B cell lymphoma.  He recently completed 6 cycles of Revlimid/Rituxan in October 2023.  His PET afterwards showed a complete response to therapy.  The plan was for him to remain on maintenance Revlimid for 6 additional cycles, but the patient wished to hold additional therapy.  He comes in today for routine follow-up.  Since his last visit, the patient has been doing well.  He denies having any B symptoms or new lymphadenopathy which concerns him for disease recurrence.    With respect to his lymphoma history, his diffuse large B-cell lymphoma was initially diagnosed in May 2000 per biopsy of a mass in his left thyroid.  As CT scans showed evidence of disease above and below his diaphragm, he was given 6 cycles of CHOP chemotherapy, followed by weekly Rituxan for 8 treatments.  His disease did well up until August 2008 when CT and PET scans showed evidence of disease recurrence.  At that time, a repeat biopsy showed follicular lymphoma, which led to him undergoing 6 cycles of Rituxan-CVP.  The plan was to continue maintenance Rituxan for 2 years, but the patient only received 1 cycle of maintenance Rituxan before discontinuing it in early 2009.  The patient has had biopsies and resections of a fleshy skin nodule behind his right ear, whose pathology revealed follicular lymphoma, but with focal areas within it suggesting potential transformation into a more aggressive subtype.  However, this area never flared up to where intervention has been necessary.  He began having problems with his right eyelid, for which a biopsy of the area confirmed the presence of recurrent low-grade follicular  lymphoma.  This biopsy and systemic disease spread on a PET scan led to him being placed on Revlimid/Rituxan, which was his first systemic therapy since 2009.Marland Kitchen  Renal he completed 6 cycles of this regimen in October 2023.  PHYSICAL EXAM  Blood pressure (!) 160/73, pulse 72, temperature 97.7 F (36.5 C), resp. rate 18, height '5\' 9"'$  (1.753 m), weight 198 lb 8 oz (90 kg), SpO2 96 %. Wt Readings from Last 3 Encounters:  07/10/22 198 lb 8 oz (90 kg)  04/09/22 199 lb (90.3 kg)  03/30/22 198 lb 6.4 oz (90 kg)   Body mass index is 29.31 kg/m. Performance status (ECOG): 1 Physical Exam Constitutional:      Appearance: Normal appearance. He is not ill-appearing.  HENT:     Mouth/Throat:     Mouth: Mucous membranes are moist.     Pharynx: Oropharynx is clear. No oropharyngeal exudate or posterior oropharyngeal erythema.  Eyes:     Comments: No evidence of any lower right eyelid thickening, consistent with positive treatment response  Cardiovascular:     Rate and Rhythm: Normal rate and regular rhythm.     Heart sounds: No murmur heard.    No friction rub. No gallop.  Pulmonary:     Effort: Pulmonary effort is normal. No respiratory distress.     Breath sounds: Normal breath sounds. No wheezing, rhonchi or rales.  Abdominal:     General: Bowel sounds are normal. There is no distension.     Palpations: Abdomen is soft. There is no mass.  Tenderness: There is no abdominal tenderness.  Musculoskeletal:        General: No swelling.     Right lower leg: No edema.     Left lower leg: No edema.  Lymphadenopathy:     Cervical: No cervical adenopathy.     Upper Body:     Right upper body: No supraclavicular or axillary adenopathy.     Left upper body: No supraclavicular or axillary adenopathy.     Lower Body: No right inguinal adenopathy. No left inguinal adenopathy.  Skin:    General: Skin is warm.     Coloration: Skin is not jaundiced.     Findings: No lesion or rash.     Comments:  Diffusely dry skin with numerous brown-colored flat moles over most of his body  Neurological:     General: No focal deficit present.     Mental Status: He is alert and oriented to person, place, and time. Mental status is at baseline.  Psychiatric:        Mood and Affect: Mood normal.        Behavior: Behavior normal.        Thought Content: Thought content normal.    LABS:    Latest Reference Range & Units 07/10/22 09:30  LDH 98 - 192 U/L 123   ASSESSMENT & PLAN:  Assessment/Plan:  An 82 y.o. male with biopsy-proven recurrence of low-grade follicular lymphoma.  He also has a history of diffuse large B-cell lymphoma.  Based upon his labs and physical exam today, the patient remains disease-free.  Clinically, he continues to do very well.  As that is the case, I will see him back in 3 months for repeat clinical assessment.  Labs and CT scans will be done a day before his next visit for his continued lymphoma surveillance.  The patient understands all the plans discussed today and is in agreement with them.  Emanie Behan Macarthur Critchley, MD

## 2022-07-10 ENCOUNTER — Other Ambulatory Visit: Payer: Self-pay | Admitting: Oncology

## 2022-07-10 ENCOUNTER — Inpatient Hospital Stay: Payer: PPO | Attending: Oncology | Admitting: Oncology

## 2022-07-10 ENCOUNTER — Inpatient Hospital Stay: Payer: PPO

## 2022-07-10 VITALS — BP 160/73 | HR 72 | Temp 97.7°F | Resp 18 | Ht 69.0 in | Wt 198.5 lb

## 2022-07-10 DIAGNOSIS — C8291 Follicular lymphoma, unspecified, lymph nodes of head, face, and neck: Secondary | ICD-10-CM | POA: Insufficient documentation

## 2022-07-10 DIAGNOSIS — D649 Anemia, unspecified: Secondary | ICD-10-CM | POA: Diagnosis not present

## 2022-07-10 DIAGNOSIS — C8298 Follicular lymphoma, unspecified, lymph nodes of multiple sites: Secondary | ICD-10-CM | POA: Diagnosis not present

## 2022-07-10 DIAGNOSIS — Z79899 Other long term (current) drug therapy: Secondary | ICD-10-CM | POA: Diagnosis not present

## 2022-07-10 DIAGNOSIS — C8208 Follicular lymphoma grade I, lymph nodes of multiple sites: Secondary | ICD-10-CM

## 2022-07-10 LAB — HEPATIC FUNCTION PANEL
ALT: 16 U/L (ref 10–40)
AST: 15 (ref 14–40)
Alkaline Phosphatase: 56 (ref 25–125)
Bilirubin, Total: 0.6

## 2022-07-10 LAB — BASIC METABOLIC PANEL
BUN: 21 (ref 4–21)
CO2: 27 — AB (ref 13–22)
Chloride: 103 (ref 99–108)
Creatinine: 1.1 (ref 0.6–1.3)
Glucose: 104
Potassium: 4.4 mEq/L (ref 3.5–5.1)
Sodium: 137 (ref 137–147)

## 2022-07-10 LAB — CBC AND DIFFERENTIAL
HCT: 42 (ref 41–53)
Hemoglobin: 14.4 (ref 13.5–17.5)
Neutrophils Absolute: 3.75
Platelets: 168 10*3/uL (ref 150–400)
WBC: 6.7

## 2022-07-10 LAB — COMPREHENSIVE METABOLIC PANEL
Albumin: 4.2 (ref 3.5–5.0)
Calcium: 9.3 (ref 8.7–10.7)

## 2022-07-10 LAB — TSH: TSH: 2.226 u[IU]/mL (ref 0.350–4.500)

## 2022-07-10 LAB — LACTATE DEHYDROGENASE: LDH: 123 U/L (ref 98–192)

## 2022-07-10 LAB — CBC: RBC: 4.98 (ref 3.87–5.11)

## 2022-08-07 ENCOUNTER — Ambulatory Visit: Payer: PPO | Attending: Cardiology | Admitting: Cardiology

## 2022-08-07 ENCOUNTER — Encounter: Payer: Self-pay | Admitting: Cardiology

## 2022-08-07 VITALS — BP 140/66 | HR 66 | Ht 69.0 in | Wt 199.4 lb

## 2022-08-07 DIAGNOSIS — Z8679 Personal history of other diseases of the circulatory system: Secondary | ICD-10-CM

## 2022-08-07 DIAGNOSIS — I10 Essential (primary) hypertension: Secondary | ICD-10-CM

## 2022-08-07 DIAGNOSIS — E7800 Pure hypercholesterolemia, unspecified: Secondary | ICD-10-CM

## 2022-08-07 HISTORY — DX: Personal history of other diseases of the circulatory system: Z86.79

## 2022-08-07 NOTE — Patient Instructions (Signed)

## 2022-08-07 NOTE — Progress Notes (Signed)
Cardiology Office Note:    Date:  08/07/2022   ID:  Maralyn Sago, DOB January 02, 1941, MRN [id number]  PCP:  [name], MD  Cardiologist:  Jenean Lindau, MD   Referring MD: [name], MD    ASSESSMENT:    1. History of cardiomyopathy   2. Hypercholesterolemia   3. Primary hypertension    PLAN:    In order of problems listed above:  Primary prevention stressed with the patient.  Importance of compliance with diet medication stressed any vocalized understanding.  He has an excellent exercise program and I congratulated him for this. Essential hypertension: Blood pressure stable and diet was emphasized.  Lifestyle modification urged.  His blood pressures at home are fine. Mixed dyslipidemia: Lipid lowering medications.  He is on high-dose of pravastatin.  He will stop this medicine for 2 weeks and he will let us know how he feels.  Accordingly we will do the needful. Obesity: Weight reduction stressed diet emphasized and he promises to do better.  He tells me that he has been lax with diet but has gotten back to his normal routine. Patient will be seen in follow-up appointment in 6 months or earlier if the patient has any concerns    Medication Adjustments/Labs and Tests Ordered: Current medicines are reviewed at length with the patient today.  Concerns regarding medicines are outlined above.  No orders of the defined types were placed in this encounter.  No orders of the defined types were placed in this encounter.    No chief complaint on file.    History of Present Illness:    Sean Hodges is a 82 y.o. male.  Patient has past medical history of essential hypertension, dyslipidemia and history of cardiomyopathy.  He has had chemotherapy for cancer.  He has had history of pulmonary embolism when on chemotherapy.  He also mentions to me that he has crampy pain in lower extremities and wants to go statin medicine for a week or 2 to see if he  feels any better.  He exercises very regularly and well.  At the time of my evaluation, the patient is alert awake oriented and in no distress.  Past Medical History:  Diagnosis Date   Arthritis    knee osteoarthritis   Benign prostatic hyperplasia 07/03/2016   Cancer ([place])    nhl   Chronic pain of left knee 11/15/2014   DDD (degenerative disc disease), lumbar 07/03/2016   Disease of thyroid gland 07/03/2016   Overview:  Left Thyroidectomy (original site of lymphoma)   Dyspnea on exertion [id number]   Follicular lymphoma ([place]) 07/29/2011   Diagnosed May 2000.  Stage IV--thyroid involvement.   GERD (gastroesophageal reflux disease) 07/03/2016   Hypercholesterolemia    Hyperlipidemia    Hypertension    Left knee pain 11/15/2014   Loose body of left knee 11/20/2014   Lumbar stenosis 07/03/2016   Lumbar stenosis with neurogenic claudication 09/21/2017   Lymphoma ([place])    Neuropathy due to chemotherapeutic drug ([place]) 09/17/2013   Nonischemic cardiomyopathy ([place]) 03/07/2015   Port catheter in place 10/29/2015   Port-A-Cath in place 05/01/2020   Postoperative urinary retention 10/05/2017   Precordial chest pain 08/30/2014   S/P left knee arthroscopy 11/26/2014   Spondylosis of lumbar joint 07/03/2016    Past Surgical History:  Procedure Laterality Date   arthroscopies Bilateral    Left eye surgery  2013   THYROIDECTOMY, PARTIAL Left    vocal cord  polyps removed    Current Medications: Current Meds  Medication Sig   Acetaminophen (TYLENOL PO) Take 350 mg by mouth every morning.   aspirin EC 81 MG tablet Take 81 mg by mouth daily.   calcium & magnesium carbonates (MYLANTA) 311-232 MG per tablet Take 1 tablet by mouth 2 (two) times daily.   Cholecalciferol (VITAMIN D3 PO) Take 500 Units by mouth 2 (two) times daily.   COENZYME Q-10 PO Take 50 mg by mouth 2 (two) times daily.    enalapril (VASOTEC) 10 MG tablet Take 1 tablet by mouth daily.   famotidine (PEPCID) 20 MG tablet Take 20 mg by  mouth 2 (two) times daily.   fish oil-omega-3 fatty acids 1000 MG capsule Take 1 g by mouth daily.   fluorouracil (EFUDEX) 5 % cream Apply 1 Application topically 2 (two) times daily.   glucosamine-chondroitin 500-400 MG tablet Take 1 tablet by mouth 2 (two) times daily.    hydrOXYzine (VISTARIL) 25 MG capsule Take 1 capsule (25 mg total) by mouth 3 (three) times daily as needed.   lidocaine (LIDODERM) 5 % Place 1 patch onto the skin as needed for pain.   ondansetron (ZOFRAN) 4 MG tablet Take 1 tablet (4 mg total) by mouth every 4 (four) hours as needed for nausea.   pravastatin (PRAVACHOL) 80 MG tablet Take 80 mg by mouth daily.    prochlorperazine (COMPAZINE) 10 MG tablet Take 1 tablet (10 mg total) by mouth every 6 (six) hours as needed for nausea or vomiting.   saw palmetto 500 MG capsule Take 500 mg by mouth 2 (two) times daily.   vitamin B-12 (CYANOCOBALAMIN) 1000 MCG tablet Take 1,000 mcg by mouth daily.    Current Facility-Administered Medications for the 08/07/22 encounter (Office Visit) with Calab Sachse, Reita Cliche, MD  Medication   triamcinolone acetonide (KENALOG-40) injection 20 mg     Allergies:   Gabapentin and Lipitor [atorvastatin]   Social History   Socioeconomic History   Marital status: Married    Spouse name: Not on file   Number of children: 3   Years of education: Not on file   Highest education level: Not on file  Occupational History   Occupation: retired-engineer  Tobacco Use   Smoking status: Former    Types: Cigarettes    Quit date: 01/20/1974    Years since quitting: 48.5   Smokeless tobacco: Former    Quit date: 06/22/1973  Vaping Use   Vaping Use: Never used  Substance and Sexual Activity   Alcohol use: Never   Drug use: Never   Sexual activity: Not on file  Other Topics Concern   Not on file  Social History Narrative   Patient is very active, walks 3 miles a day, former Psychologist, prison and probation services, 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 3 children [place] in [place], 2 in [place]   Social Determinants of Health   Financial Resource Strain: Not on file  Food Insecurity: Not on file  Transportation Needs: Not on file  Physical Activity: Not on file  Stress: Not on file  Social Connections: Not on file     Family History: The patient's family history includes Cancer in his brother, brother, brother, brother, and brother.  ROS:   Please see the history of present illness.    All other systems reviewed and are negative.  EKGs/Labs/Other Studies Reviewed:    The following studies were reviewed today: I discussed my  findings with the patient at length.   Recent Labs: 07/10/2022: ALT 16; BUN 21; Creatinine 1.1; Hemoglobin 14.4; Platelets 168; Potassium 4.4; Sodium 137; TSH 2.226  Recent Lipid Panel    Component Value Date/Time   CHOL 152 08/30/2014 1028   TRIG 90 08/30/2014 1028   HDL 39 (L) 08/30/2014 1028   CHOLHDL 3.9 08/30/2014 1028   VLDL 18 08/30/2014 1028   LDLCALC 95 08/30/2014 1028    Physical Exam:    VS:  BP (!) 140/66   Pulse 66   Ht 5' 9"$  (1.753 m)   Wt 199 lb 6.4 oz (90.4 kg)   SpO2 96%   BMI 29.45 kg/m     Wt Readings from Last 3 Encounters:  08/07/22 199 lb 6.4 oz (90.4 kg)  07/10/22 198 lb 8 oz (90 kg)  04/09/22 199 lb (90.3 kg)     GEN: Patient is in no acute distress HEENT: Normal NECK: No JVD; No carotid bruits LYMPHATICS: No lymphadenopathy CARDIAC: Hear sounds regular, 2/6 systolic murmur at the apex. RESPIRATORY:  Clear to auscultation without rales, wheezing or rhonchi  ABDOMEN: Soft, non-tender, non-distended MUSCULOSKELETAL:  No edema; No deformity  SKIN: Warm and dry NEUROLOGIC:  Alert and oriented x 3 PSYCHIATRIC:  Normal affect   Signed, Jenean Lindau, MD  08/07/2022 9:20 AM    [place]

## 2022-08-26 ENCOUNTER — Inpatient Hospital Stay: Payer: PPO | Attending: Oncology

## 2022-08-26 VITALS — BP 131/66 | HR 65 | Temp 97.6°F | Resp 18 | Ht 69.0 in | Wt 199.2 lb

## 2022-08-26 DIAGNOSIS — C8208 Follicular lymphoma grade I, lymph nodes of multiple sites: Secondary | ICD-10-CM | POA: Diagnosis not present

## 2022-08-26 DIAGNOSIS — Z95828 Presence of other vascular implants and grafts: Secondary | ICD-10-CM

## 2022-08-26 DIAGNOSIS — Z452 Encounter for adjustment and management of vascular access device: Secondary | ICD-10-CM | POA: Insufficient documentation

## 2022-08-26 MED ORDER — SODIUM CHLORIDE 0.9% FLUSH
10.0000 mL | INTRAVENOUS | Status: DC | PRN
  Administered 2022-08-26: 10 mL

## 2022-08-26 MED ORDER — HEPARIN SOD (PORK) LOCK FLUSH 100 UNIT/ML IV SOLN
500.0000 [IU] | Freq: Once | INTRAVENOUS | Status: AC | PRN
  Administered 2022-08-26: 500 [IU]

## 2022-09-22 DIAGNOSIS — M5416 Radiculopathy, lumbar region: Secondary | ICD-10-CM | POA: Diagnosis not present

## 2022-09-22 DIAGNOSIS — M47896 Other spondylosis, lumbar region: Secondary | ICD-10-CM | POA: Diagnosis not present

## 2022-09-22 DIAGNOSIS — M4326 Fusion of spine, lumbar region: Secondary | ICD-10-CM | POA: Diagnosis not present

## 2022-09-22 DIAGNOSIS — I10 Essential (primary) hypertension: Secondary | ICD-10-CM | POA: Diagnosis not present

## 2022-10-08 DIAGNOSIS — I2510 Atherosclerotic heart disease of native coronary artery without angina pectoris: Secondary | ICD-10-CM | POA: Diagnosis not present

## 2022-10-08 DIAGNOSIS — C8290 Follicular lymphoma, unspecified, unspecified site: Secondary | ICD-10-CM | POA: Diagnosis not present

## 2022-10-08 DIAGNOSIS — C8298 Follicular lymphoma, unspecified, lymph nodes of multiple sites: Secondary | ICD-10-CM | POA: Diagnosis not present

## 2022-10-08 DIAGNOSIS — M47816 Spondylosis without myelopathy or radiculopathy, lumbar region: Secondary | ICD-10-CM | POA: Diagnosis not present

## 2022-10-08 DIAGNOSIS — M4326 Fusion of spine, lumbar region: Secondary | ICD-10-CM | POA: Diagnosis not present

## 2022-10-08 DIAGNOSIS — M4726 Other spondylosis with radiculopathy, lumbar region: Secondary | ICD-10-CM | POA: Diagnosis not present

## 2022-10-08 DIAGNOSIS — M5136 Other intervertebral disc degeneration, lumbar region: Secondary | ICD-10-CM | POA: Diagnosis not present

## 2022-10-08 DIAGNOSIS — I700 Atherosclerosis of aorta: Secondary | ICD-10-CM | POA: Diagnosis not present

## 2022-10-08 LAB — CBC AND DIFFERENTIAL
HCT: 42 (ref 41–53)
Hemoglobin: 14.4 (ref 13.5–17.5)
Neutrophils Absolute: 3.25
Platelets: 183 10*3/uL (ref 150–400)
WBC: 5.9

## 2022-10-08 LAB — BASIC METABOLIC PANEL
BUN: 20 (ref 4–21)
CO2: 29 — AB (ref 13–22)
Chloride: 101 (ref 99–108)
Creatinine: 1.2 (ref 0.6–1.3)
EGFR: 58
Glucose: 103
Potassium: 4.5 mEq/L (ref 3.5–5.1)
Sodium: 137 (ref 137–147)

## 2022-10-08 LAB — CBC: RBC: 4.88 (ref 3.87–5.11)

## 2022-10-08 LAB — COMPREHENSIVE METABOLIC PANEL
Albumin: 4.2 (ref 3.5–5.0)
Calcium: 9.1 (ref 8.7–10.7)

## 2022-10-08 LAB — HEPATIC FUNCTION PANEL
ALT: 13 U/L (ref 10–40)
AST: 14 (ref 14–40)
Alkaline Phosphatase: 63 (ref 25–125)
Bilirubin, Total: 0.5

## 2022-10-08 NOTE — Progress Notes (Signed)
[facility]  [address] [place],  [place]  [address] [phone]  Clinic Day:  10/09/2022  Referring physician: Noni Saupe, MD  HISTORY OF PRESENT ILLNESS:  The patient is an 82 y.o. male with recurrent low grade follicular lymphoma.  There is also history of diffuse large B cell lymphoma.  He recently completed 6 cycles of Revlimid/Rituxan in October 2023.  His PET afterwards showed a complete response to therapy.  The plan was for him to remain on maintenance Revlimid for 6 additional cycles, but the patient wished to hold additional therapy.  He comes in today for routine follow-up, as well as to review his most recent CT scans.  Since his last visit, the patient has been doing well.  He denies having any B symptoms or new lymphadenopathy which concerns him for disease recurrence.    With respect to his lymphoma history, his diffuse large B-cell lymphoma was initially diagnosed in May 2000 per biopsy of a mass in his left thyroid.  As CT scans showed evidence of disease above and below his diaphragm, he was given 6 cycles of CHOP chemotherapy, followed by weekly Rituxan for 8 treatments.  His disease did well up until August 2008 when CT and PET scans showed evidence of disease recurrence.  At that time, a repeat biopsy showed follicular lymphoma, which led to him undergoing 6 cycles of Rituxan-CVP.  The plan was to continue maintenance Rituxan for 2 years, but the patient only received 1 cycle of maintenance Rituxan before discontinuing it in early 2009.  The patient has had biopsies and resections of a fleshy skin nodule behind his right ear, whose pathology revealed follicular lymphoma, but with focal areas within it suggesting potential transformation into a more aggressive subtype.  However, this area never flared up to where intervention has been necessary.  He began having problems with his right eyelid, for which a biopsy of the area confirmed the  presence of recurrent low-grade follicular lymphoma.  This biopsy and systemic disease spread on a PET scan led to him being placed on Revlimid/Rituxan, which was his first systemic therapy since 2009.  He completed 6 cycles of this regimen in October 2023.  PHYSICAL EXAM  Blood pressure (!) 166/73, pulse 62, temperature 97.7 F (36.5 C), resp. rate 16, height  (1.753 m), weight 201 lb 3.2 oz (91.3 kg), SpO2 97 %. Wt Readings from Last 3 Encounters:  10/09/22 201 lb 3.2 oz (91.3 kg)  08/26/22 199 lb 4 oz (90.4 kg)  08/07/22 199 lb 6.4 oz (90.4 kg)   Body mass index is 29.71 kg/m. Performance status (ECOG): 1 Physical Exam Constitutional:      Appearance: Normal appearance. He is not ill-appearing.  HENT:     Mouth/Throat:     Mouth: Mucous membranes are moist.     Pharynx: Oropharynx is clear. No oropharyngeal exudate or posterior oropharyngeal erythema.  Eyes:     Comments: No evidence of any lower right eyelid thickening, consistent with positive treatment response  Cardiovascular:     Rate and Rhythm: Normal rate and regular rhythm.     Heart sounds: No murmur heard.    No friction rub. No gallop.  Pulmonary:     Effort: Pulmonary effort is normal. No respiratory distress.     Breath sounds: Normal breath sounds. No wheezing, rhonchi or rales.  Abdominal:     General: Bowel sounds are normal. There is no distension.  Palpations: Abdomen is soft. There is no mass.     Tenderness: There is no abdominal tenderness.  Musculoskeletal:        General: No swelling.     Right lower leg: No edema.     Left lower leg: No edema.  Lymphadenopathy:     Cervical: No cervical adenopathy.     Upper Body:     Right upper body: No supraclavicular or axillary adenopathy.     Left upper body: No supraclavicular or axillary adenopathy.     Lower Body: No right inguinal adenopathy. No left inguinal adenopathy.  Skin:    General: Skin is warm.     Coloration: Skin is not jaundiced.      Findings: No lesion or rash.     Comments: Diffusely dry skin with numerous brown-colored flat moles over most of his body  Neurological:     General: No focal deficit present.     Mental Status: He is alert and oriented to person, place, and time. Mental status is at baseline.  Psychiatric:        Mood and Affect: Mood normal.        Behavior: Behavior normal.        Thought Content: Thought content normal.   SCANS:  CT scans of his chest/abdomen/pelvis revealed the following: FINDINGS: CT CHEST FINDINGS  Cardiovascular: Right Port-A-Cath tip: SVC.  Coronary, aortic arch, and branch vessel atherosclerotic vascular disease.  Mediastinum/Nodes: Unremarkable  Lungs/Pleura: Stable mild chronic scarring both lung bases.  Musculoskeletal: Thoracic spondylosis.  CT ABDOMEN PELVIS FINDINGS  Hepatobiliary: Stable 4 mm hypodense lesion in the right hepatic lobe peripherally on image 63 series 2, highly likely to be benign but technically too small to characterize. Gallbladder unremarkable.  Pancreas: Unremarkable  Spleen: Unremarkable  Adrenals/Urinary Tract: Stable fluid density 2.8 cm right kidney upper pole cyst on image 70 of series 2. No further imaging workup of this lesion is indicated.  Stable 0.9 cm benign cyst of the left kidney lower pole on image 105 series 601. No further imaging workup of this lesion is indicated.  No overt adrenal mass. Urinary bladder unremarkable.  Stomach/Bowel: Unremarkable  Vascular/Lymphatic: Atherosclerosis is present, including aortoiliac atherosclerotic disease. Mild atheromatous plaque at the origins of the celiac trunk and SMA.  Ill-defined left periaortic lymph node 1.0 cm in short axis on image 76 series 2, formerly 1.1 cm on 12/02/2021 and formerly 1.0 cm on 04/08/2022. No current pathologic pelvic adenopathy is identified.  Reproductive: The right-side of the prostate gland is partially obscured by streak artifact from  the right total hip prosthesis. Otherwise unremarkable.  Other: No supplemental non-categorized findings.  Musculoskeletal: Right total hip prosthesis. Left foraminal impingement at L5-S1 due to facet spurring. Posterolateral rod and pedicle screw fixation with solid interbody fusion at L3-L4-L5.  IMPRESSION: 1. Stable appearance of the ill-defined/bandlike left periaortic lymph node at 1.0 cm in short axis, previously 1.1 cm on 12/02/2021 and formerly 1.0 cm on 04/08/2022. No new or progressive adenopathy. 2. Other imaging findings of potential clinical significance: Coronary, aortic arch, and branch vessel atherosclerotic vascular disease. Left foraminal impingement at L5-S1 due to facet spurring. Stable mild chronic scarring in both lung bases.  LABS:     ASSESSMENT & PLAN:  Assessment/Plan:  An 82 y.o. male with biopsy-proven recurrence of low-grade follicular lymphoma.  He also has a history of diffuse large B-cell lymphoma.  Today, went over all of his CT scan images with him, for which she could  see he remains disease-free.  Clinically, he is doing very well.  There remains nothing per his labs and physical exam that is concerning for disease recurrence.  As that is the case, I will see him back in 4 months for repeat clinical assessment.  The patient understands all the plans discussed today and is in agreement with them.  Saranne Crislip Kirby Funk, MD

## 2022-10-09 ENCOUNTER — Other Ambulatory Visit: Payer: Self-pay | Admitting: Oncology

## 2022-10-09 ENCOUNTER — Telehealth: Payer: Self-pay | Admitting: Oncology

## 2022-10-09 ENCOUNTER — Inpatient Hospital Stay: Payer: PPO | Attending: Oncology | Admitting: Oncology

## 2022-10-09 VITALS — BP 166/73 | HR 62 | Temp 97.7°F | Resp 16 | Ht 69.0 in | Wt 201.2 lb

## 2022-10-09 DIAGNOSIS — C8203 Follicular lymphoma grade I, intra-abdominal lymph nodes: Secondary | ICD-10-CM | POA: Diagnosis not present

## 2022-10-09 DIAGNOSIS — C8298 Follicular lymphoma, unspecified, lymph nodes of multiple sites: Secondary | ICD-10-CM

## 2022-10-09 NOTE — Telephone Encounter (Signed)
10/09/22 Next appt scheduled and confirmed with patient 

## 2022-10-15 LAB — LACTATE DEHYDROGENASE: LDH: 151

## 2022-10-22 DIAGNOSIS — M461 Sacroiliitis, not elsewhere classified: Secondary | ICD-10-CM | POA: Diagnosis not present

## 2022-10-22 DIAGNOSIS — M4326 Fusion of spine, lumbar region: Secondary | ICD-10-CM | POA: Diagnosis not present

## 2022-10-26 DIAGNOSIS — L570 Actinic keratosis: Secondary | ICD-10-CM | POA: Diagnosis not present

## 2022-10-26 DIAGNOSIS — L301 Dyshidrosis [pompholyx]: Secondary | ICD-10-CM | POA: Diagnosis not present

## 2022-10-26 DIAGNOSIS — C44319 Basal cell carcinoma of skin of other parts of face: Secondary | ICD-10-CM | POA: Diagnosis not present

## 2022-10-28 ENCOUNTER — Encounter: Payer: Self-pay | Admitting: Oncology

## 2022-11-02 DIAGNOSIS — M461 Sacroiliitis, not elsewhere classified: Secondary | ICD-10-CM | POA: Diagnosis not present

## 2022-11-02 DIAGNOSIS — M4326 Fusion of spine, lumbar region: Secondary | ICD-10-CM | POA: Diagnosis not present

## 2022-12-01 DIAGNOSIS — M461 Sacroiliitis, not elsewhere classified: Secondary | ICD-10-CM | POA: Diagnosis not present

## 2022-12-03 DIAGNOSIS — Z961 Presence of intraocular lens: Secondary | ICD-10-CM | POA: Diagnosis not present

## 2022-12-17 DIAGNOSIS — L578 Other skin changes due to chronic exposure to nonionizing radiation: Secondary | ICD-10-CM | POA: Diagnosis not present

## 2022-12-17 DIAGNOSIS — L570 Actinic keratosis: Secondary | ICD-10-CM | POA: Diagnosis not present

## 2022-12-17 DIAGNOSIS — L814 Other melanin hyperpigmentation: Secondary | ICD-10-CM | POA: Diagnosis not present

## 2022-12-17 DIAGNOSIS — C44519 Basal cell carcinoma of skin of other part of trunk: Secondary | ICD-10-CM | POA: Diagnosis not present

## 2022-12-17 DIAGNOSIS — L820 Inflamed seborrheic keratosis: Secondary | ICD-10-CM | POA: Diagnosis not present

## 2022-12-17 DIAGNOSIS — L821 Other seborrheic keratosis: Secondary | ICD-10-CM | POA: Diagnosis not present

## 2023-01-12 DIAGNOSIS — M6281 Muscle weakness (generalized): Secondary | ICD-10-CM | POA: Diagnosis not present

## 2023-01-12 DIAGNOSIS — M461 Sacroiliitis, not elsewhere classified: Secondary | ICD-10-CM | POA: Diagnosis not present

## 2023-01-12 DIAGNOSIS — R293 Abnormal posture: Secondary | ICD-10-CM | POA: Diagnosis not present

## 2023-01-12 DIAGNOSIS — M2569 Stiffness of other specified joint, not elsewhere classified: Secondary | ICD-10-CM | POA: Diagnosis not present

## 2023-01-12 DIAGNOSIS — R2689 Other abnormalities of gait and mobility: Secondary | ICD-10-CM | POA: Diagnosis not present

## 2023-01-12 DIAGNOSIS — M5459 Other low back pain: Secondary | ICD-10-CM | POA: Diagnosis not present

## 2023-01-21 DIAGNOSIS — R2689 Other abnormalities of gait and mobility: Secondary | ICD-10-CM | POA: Diagnosis not present

## 2023-01-21 DIAGNOSIS — M461 Sacroiliitis, not elsewhere classified: Secondary | ICD-10-CM | POA: Diagnosis not present

## 2023-01-21 DIAGNOSIS — M2569 Stiffness of other specified joint, not elsewhere classified: Secondary | ICD-10-CM | POA: Diagnosis not present

## 2023-01-21 DIAGNOSIS — M6281 Muscle weakness (generalized): Secondary | ICD-10-CM | POA: Diagnosis not present

## 2023-01-21 DIAGNOSIS — M5459 Other low back pain: Secondary | ICD-10-CM | POA: Diagnosis not present

## 2023-01-21 DIAGNOSIS — R293 Abnormal posture: Secondary | ICD-10-CM | POA: Diagnosis not present

## 2023-01-25 DIAGNOSIS — I10 Essential (primary) hypertension: Secondary | ICD-10-CM | POA: Diagnosis not present

## 2023-01-25 DIAGNOSIS — Z1339 Encounter for screening examination for other mental health and behavioral disorders: Secondary | ICD-10-CM | POA: Diagnosis not present

## 2023-01-25 DIAGNOSIS — K219 Gastro-esophageal reflux disease without esophagitis: Secondary | ICD-10-CM | POA: Diagnosis not present

## 2023-01-25 DIAGNOSIS — Z86711 Personal history of pulmonary embolism: Secondary | ICD-10-CM | POA: Diagnosis not present

## 2023-01-25 DIAGNOSIS — Z1331 Encounter for screening for depression: Secondary | ICD-10-CM | POA: Diagnosis not present

## 2023-01-25 DIAGNOSIS — R200 Anesthesia of skin: Secondary | ICD-10-CM | POA: Diagnosis not present

## 2023-01-25 DIAGNOSIS — E782 Mixed hyperlipidemia: Secondary | ICD-10-CM | POA: Diagnosis not present

## 2023-01-25 DIAGNOSIS — Z0000 Encounter for general adult medical examination without abnormal findings: Secondary | ICD-10-CM | POA: Diagnosis not present

## 2023-01-25 DIAGNOSIS — Z79899 Other long term (current) drug therapy: Secondary | ICD-10-CM | POA: Diagnosis not present

## 2023-01-25 DIAGNOSIS — Z6828 Body mass index (BMI) 28.0-28.9, adult: Secondary | ICD-10-CM | POA: Diagnosis not present

## 2023-01-25 DIAGNOSIS — M5416 Radiculopathy, lumbar region: Secondary | ICD-10-CM | POA: Diagnosis not present

## 2023-01-25 LAB — LAB REPORT - SCANNED: EGFR: 58

## 2023-02-08 NOTE — Progress Notes (Unsigned)
[facility]  [address] [place],  [place]  [address] [phone]  Clinic Day:  02/09/2023  Referring physician:  [facility]  HISTORY OF PRESENT ILLNESS:  The patient is an 82 y.o. male with recurrent low grade follicular lymphoma.  There is also history of diffuse large B cell lymphoma.  He last completed 6 cycles of Revlimid/Rituxan in October 2023.  His PET afterwards showed a complete response to therapy.  He comes in today for routine follow-up.  Since his last visit, the patient has been doing well.  He denies having any B symptoms or new lymphadenopathy which concerns him for disease recurrence.    With respect to his lymphoma history, his diffuse large B-cell lymphoma was initially diagnosed in May 2000 per biopsy of a mass in his left thyroid.  As CT scans showed evidence of disease above and below his diaphragm, he was given 6 cycles of CHOP chemotherapy, followed by weekly Rituxan for 8 treatments.  His disease did well up until August 2008 when CT and PET scans showed evidence of disease recurrence.  At that time, a repeat biopsy showed follicular lymphoma, which led to him undergoing 6 cycles of Rituxan-CVP.  The plan was to continue maintenance Rituxan for 2 years, but the patient only received 1 cycle of maintenance Rituxan before discontinuing it in early 2009.  The patient has had biopsies and resections of a fleshy skin nodule behind his right ear, whose pathology revealed follicular lymphoma, but with focal areas within it suggesting potential transformation into a more aggressive subtype.  However, this area never flared up to where intervention has been necessary.  He began having problems with his right eyelid, for which a biopsy of the area confirmed the presence of recurrent low-grade follicular lymphoma.  This biopsy and systemic disease spread on a PET scan led to him being placed on Revlimid/Rituxan, which was his first  systemic therapy since 2009.  He completed 6 cycles of this regimen in October 2023, with a PET scan afterwards showing a complete response.    PHYSICAL EXAM  Blood pressure (!) 140/68, pulse 62, temperature 97.6 F (36.4 C), resp. rate 16, height 5\' 9"  (1.753 m), weight 195 lb 11.2 oz (88.8 kg), SpO2 96%. Wt Readings from Last 3 Encounters:  02/09/23 195 lb 11.2 oz (88.8 kg)  10/09/22 201 lb 3.2 oz (91.3 kg)  08/26/22 199 lb 4 oz (90.4 kg)   Body mass index is 28.9 kg/m. Performance status (ECOG): 1 Physical Exam Constitutional:      Appearance: Normal appearance. He is not ill-appearing.  HENT:     Mouth/Throat:     Mouth: Mucous membranes are moist.     Pharynx: Oropharynx is clear. No oropharyngeal exudate or posterior oropharyngeal erythema.  Eyes:     Comments: No evidence of any lower right eyelid thickening, consistent with positive treatment response  Cardiovascular:     Rate and Rhythm: Normal rate and regular rhythm.     Heart sounds: No murmur heard.    No friction rub. No gallop.  Pulmonary:     Effort: Pulmonary effort is normal. No respiratory distress.     Breath sounds: Normal breath sounds. No wheezing, rhonchi or rales.  Abdominal:     General: Bowel sounds are normal. There is no distension.     Palpations: Abdomen is soft. There is no mass.     Tenderness: There is no abdominal tenderness.  Musculoskeletal:  General: No swelling.     Right lower leg: No edema.     Left lower leg: No edema.  Lymphadenopathy:     Cervical: No cervical adenopathy.     Upper Body:     Right upper body: No supraclavicular or axillary adenopathy.     Left upper body: No supraclavicular or axillary adenopathy.     Lower Body: No right inguinal adenopathy. No left inguinal adenopathy.  Skin:    General: Skin is warm.     Coloration: Skin is not jaundiced.     Findings: No lesion or rash.     Comments: Diffusely dry skin with numerous brown-colored flat moles over most  of his body  Neurological:     General: No focal deficit present.     Mental Status: He is alert and oriented to person, place, and time. Mental status is at baseline.  Psychiatric:        Mood and Affect: Mood normal.        Behavior: Behavior normal.        Thought Content: Thought content normal.   LABS:    Latest Reference Range & Units 02/09/23 08:51  Sodium 135 - 145 mmol/L 137  Potassium 3.5 - 5.1 mmol/L 4.1  Chloride 98 - 111 mmol/L 100  CO2 22 - 32 mmol/L 29  Glucose 70 - 99 mg/dL 151 (H)  BUN 8 - 23 mg/dL 22  Creatinine 7.61 - 6.07 mg/dL 3.71 (H)  Calcium 8.9 - 10.3 mg/dL 8.9  Anion gap 5 - 15  8  Alkaline Phosphatase 38 - 126 U/L 43  Albumin 3.5 - 5.0 g/dL 4.0  AST 15 - 41 U/L 10 (L)  ALT 0 - 44 U/L 12  Total Protein 6.5 - 8.1 g/dL 6.3 (L)  Total Bilirubin 0.3 - 1.2 mg/dL 0.7  GFR, Est Non African American >60 mL/min 55 (L)  LDH 98 - 192 U/L 128  (H): Data is abnormally high (L): Data is abnormally low  ASSESSMENT & PLAN:  Assessment/Plan:  An 82 y.o. male with biopsy-proven recurrence of low-grade follicular lymphoma.  He also has a history of diffuse large B-cell lymphoma.  Based upon his physical exam and labs today, the patient remains disease free.  Clinically, he is doing very well.  As that is the case, I will see him back in 4 months for repeat clinical assessment.  The patient understands all the plans discussed today and is in agreement with them.  Croix Presley Kirby Funk, MD

## 2023-02-09 ENCOUNTER — Inpatient Hospital Stay: Payer: PPO | Attending: Oncology | Admitting: Oncology

## 2023-02-09 ENCOUNTER — Other Ambulatory Visit: Payer: Self-pay | Admitting: Oncology

## 2023-02-09 ENCOUNTER — Inpatient Hospital Stay: Payer: PPO

## 2023-02-09 VITALS — BP 140/68 | HR 62 | Temp 97.6°F | Resp 16 | Ht 69.0 in | Wt 195.7 lb

## 2023-02-09 DIAGNOSIS — C8298 Follicular lymphoma, unspecified, lymph nodes of multiple sites: Secondary | ICD-10-CM

## 2023-02-09 DIAGNOSIS — C8208 Follicular lymphoma grade I, lymph nodes of multiple sites: Secondary | ICD-10-CM

## 2023-02-09 DIAGNOSIS — Z452 Encounter for adjustment and management of vascular access device: Secondary | ICD-10-CM | POA: Diagnosis not present

## 2023-02-09 DIAGNOSIS — C8290 Follicular lymphoma, unspecified, unspecified site: Secondary | ICD-10-CM | POA: Insufficient documentation

## 2023-02-09 DIAGNOSIS — D649 Anemia, unspecified: Secondary | ICD-10-CM | POA: Diagnosis not present

## 2023-02-09 LAB — CMP (CANCER CENTER ONLY)
ALT: 12 U/L (ref 0–44)
AST: 10 U/L — ABNORMAL LOW (ref 15–41)
Albumin: 4 g/dL (ref 3.5–5.0)
Alkaline Phosphatase: 43 U/L (ref 38–126)
Anion gap: 8 (ref 5–15)
BUN: 22 mg/dL (ref 8–23)
CO2: 29 mmol/L (ref 22–32)
Calcium: 8.9 mg/dL (ref 8.9–10.3)
Chloride: 100 mmol/L (ref 98–111)
Creatinine: 1.3 mg/dL — ABNORMAL HIGH (ref 0.61–1.24)
GFR, Estimated: 55 mL/min — ABNORMAL LOW (ref >60)
Glucose, Bld: 100 mg/dL — ABNORMAL HIGH (ref 70–99)
Potassium: 4.1 mmol/L (ref 3.5–5.1)
Sodium: 137 mmol/L (ref 135–145)
Total Bilirubin: 0.7 mg/dL (ref 0.3–1.2)
Total Protein: 6.3 g/dL — ABNORMAL LOW (ref 6.5–8.1)

## 2023-02-09 LAB — CBC AND DIFFERENTIAL
HCT: 43 (ref 41–53)
Hemoglobin: 14.7 (ref 13.5–17.5)
Neutrophils Absolute: 3.92
Platelets: 171 10*3/uL (ref 150–400)
WBC: 7.4

## 2023-02-09 LAB — LACTATE DEHYDROGENASE: LDH: 128 U/L (ref 98–192)

## 2023-02-09 LAB — CBC: RBC: 5 (ref 3.87–5.11)

## 2023-02-10 ENCOUNTER — Inpatient Hospital Stay: Payer: PPO

## 2023-02-10 VITALS — BP 142/65 | HR 58 | Temp 97.9°F | Resp 18 | Ht 69.0 in | Wt 197.5 lb

## 2023-02-10 DIAGNOSIS — Z95828 Presence of other vascular implants and grafts: Secondary | ICD-10-CM

## 2023-02-10 DIAGNOSIS — Z452 Encounter for adjustment and management of vascular access device: Secondary | ICD-10-CM | POA: Diagnosis not present

## 2023-02-10 MED ORDER — SODIUM CHLORIDE 0.9% FLUSH
10.0000 mL | INTRAVENOUS | Status: DC | PRN
  Administered 2023-02-10: 10 mL

## 2023-02-10 MED ORDER — HEPARIN SOD (PORK) LOCK FLUSH 100 UNIT/ML IV SOLN
500.0000 [IU] | Freq: Once | INTRAVENOUS | Status: AC | PRN
  Administered 2023-02-10: 500 [IU]

## 2023-02-10 NOTE — Patient Instructions (Signed)
 [employer], Adult A central line is a long, thin tube (catheter) that is put into a vein so that it goes to a large vein above your heart. It can be used to: Give you medicine or fluids. Give you food and nutrients. Take blood or give you blood for testing or treatments. Types of central lines There are four main types of central lines: Peripherally inserted central catheter (PICC) line. This type is usually put in the upper arm and goes up the arm to the heart. Tunneled central line. This type is placed in a large vein in the neck, chest, or groin. It is tunneled under the skin and brought out through a second incision. Non-tunneled central line. This type is used for a shorter time than other types, usually for 7 days at the most. It is inserted in the neck, chest, or groin. Implanted port. This type can stay in place longer than other types of central lines. It is normally put in the upper chest but can also be placed in the upper arm or the belly. Surgery is needed to put it in and take it out. The type of central line you get will depend on how long you need it and your medical condition. Tell a doctor about: Any allergies you have. All medicines you are taking. These include vitamins, herbs, eye drops, creams, and over-the-counter medicines. Any problems you or family members have had with anesthetic medicines. Any blood disorders you have. Any surgeries you have had. Any medical conditions you have. Whether you are pregnant or may be pregnant. What are the risks? Generally, central lines are safe. However, problems may occur, including: Infection. A blood clot. Bleeding from the place where the central line was inserted. Getting a hole or crack in the central line. If this happens, the central line will need to be replaced. Central line failure. The catheter moving or coming out of place. What happens before the procedure? Medicines Ask your doctor about changing or  stopping: Your normal medicines. Vitamins, herbs, and supplements. Over-the-counter medicines. Do not take aspirin or ibuprofen unless you are told to. General instructions Follow instructions from your doctor about eating or drinking. For your safety, your doctor may: Loraine Leriche the area of the procedure. Remove hair at the procedure site. Ask you to wash with a soap that kills germs. Plan to have a responsible adult take you home from the hospital or clinic. If you will be going home right after the procedure, plan to have a responsible adult care for you for the time you are told. This is important. What happens during the procedure? An IV tube will be put into one of your veins. You may be given: A sedative. This medicine helps you relax. Anesthetics. These medicines numb certain areas of your body. Your skin will be cleaned with a germ-killing (antiseptic) solution. You may be covered with clean drapes. Your blood pressure, heart rate, breathing rate, and blood oxygen level will be monitored during the procedure. The central line will be put into the vein and moved through it to the correct spot. The doctor may use X-ray equipment to help guide the central line to the right place. A bandage (dressing) will be placed over the insertion area. The procedure may vary among doctors and hospitals. What can I expect after the procedure? You will be monitored until you leave the hospital or clinic. This includes checking your blood pressure, heart rate, breathing rate, and blood oxygen level. Caps may  be placed on the ends of the central line tubing. If you were given a sedative during your procedure, do not drive or use machines until your doctor says that it is safe. Follow these instructions at home: Caring for the tube  Follow instructions from your doctor about: Flushing the tube. Cleaning the tube and the area around it. Only use germ-free (sterile) supplies to flush. The supplies  should be from your doctor, a pharmacy, or another place that your doctor recommends. Before you flush the tube or clean the area around the tube: Wash your hands with soap and water for at least 20 seconds. If you cannot use soap and water, use hand sanitizer. Clean the central line hub with rubbing alcohol. To do this: Scrub it using a twisting motion and rub for 10 to 15 seconds or for 30 twists. Follow the manufacturer's instructions. Be sure you scrub the top of the hub, not just the sides. Never reuse alcohol pads. Let the hub dry before use. Keep it from touching anything while drying. Caring for your skin Check the skin around the central line every day for signs of infection. Check for: Redness, swelling, or pain. Fluid or blood. Warmth. Pus or a bad smell. Keep the area where the tube was put in clean and dry. Change bandages only as told by your doctor. Keep your bandage dry. If a bandage gets wet, have it changed right away. General instructions Keep the tube clamped, unless it is being used. If you or someone else accidentally pulls on the tube, make sure: The bandage is okay. There is no bleeding. The tube has not been pulled out. Do not use scissors or sharp objects near the tube. Do not take baths, swim, or use a hot tub until your doctor says it is okay. Ask your doctor if you may take showers. You may only be allowed to take sponge baths. Ask your doctor what activities are safe for you. Your doctor may tell you not to lift anything or move your arm too much. Take over-the-counter and prescription medicines only as told by your doctor. Keep all follow-up visits. Storing and throwing away supplies Keep your supplies in a clean, dry location. Throw away any used syringes in a container that is only for sharp items (sharps container). You can buy a sharps container from a pharmacy, or you can make one by using an empty hard plastic bottle with a cover. Place any used  bandages or infusion bags into a plastic bag. Throw that bag in the trash. Contact a doctor if: You have any of these signs of infection where the tube was put in: Redness, swelling, or pain. Fluid or blood. Warmth. Pus or a bad smell. Get help right away if: You have: A fever or chills. Shortness of breath. Pain in your chest. A fast heartbeat. Swelling in your neck, face, chest, or arm. You feel dizzy or you faint. There are red lines coming from where the tube was put in. The area where the tube was put in is bleeding and the bleeding will not stop. Your tube is hard to flush. You do not get a blood return from the tube. The tube gets loose or comes out. The tube has a hole or a tear. The tube leaks. Summary A central line is a long, thin tube (catheter) that is put in your vein. It can be used to give you medicine, food, or fluids. Follow instructions from your doctor about flushing  and cleaning the tube. Keep the area where the tube was put in clean and dry. Ask your doctor what activities are safe for you. This information is not intended to replace advice given to you by your health care provider. Make sure you discuss any questions you have with your health care provider. Document Revised: 02/07/2020 Document Reviewed: 02/08/2020 Elsevier Patient Education  2024 [employer].

## 2023-02-23 DIAGNOSIS — M5459 Other low back pain: Secondary | ICD-10-CM | POA: Diagnosis not present

## 2023-02-23 DIAGNOSIS — R293 Abnormal posture: Secondary | ICD-10-CM | POA: Diagnosis not present

## 2023-02-23 DIAGNOSIS — M6281 Muscle weakness (generalized): Secondary | ICD-10-CM | POA: Diagnosis not present

## 2023-02-23 DIAGNOSIS — M2569 Stiffness of other specified joint, not elsewhere classified: Secondary | ICD-10-CM | POA: Diagnosis not present

## 2023-02-23 DIAGNOSIS — R2689 Other abnormalities of gait and mobility: Secondary | ICD-10-CM | POA: Diagnosis not present

## 2023-02-23 DIAGNOSIS — M461 Sacroiliitis, not elsewhere classified: Secondary | ICD-10-CM | POA: Diagnosis not present

## 2023-04-02 DIAGNOSIS — M461 Sacroiliitis, not elsewhere classified: Secondary | ICD-10-CM | POA: Diagnosis not present

## 2023-04-29 ENCOUNTER — Other Ambulatory Visit: Payer: Self-pay

## 2023-04-30 ENCOUNTER — Encounter: Payer: Self-pay | Admitting: Cardiology

## 2023-04-30 ENCOUNTER — Ambulatory Visit: Payer: PPO | Attending: Cardiology | Admitting: Cardiology

## 2023-04-30 VITALS — BP 136/66 | HR 63 | Ht 69.0 in | Wt 194.4 lb

## 2023-04-30 DIAGNOSIS — I10 Essential (primary) hypertension: Secondary | ICD-10-CM

## 2023-04-30 DIAGNOSIS — E7800 Pure hypercholesterolemia, unspecified: Secondary | ICD-10-CM | POA: Diagnosis not present

## 2023-04-30 DIAGNOSIS — Z8679 Personal history of other diseases of the circulatory system: Secondary | ICD-10-CM | POA: Diagnosis not present

## 2023-04-30 DIAGNOSIS — I2510 Atherosclerotic heart disease of native coronary artery without angina pectoris: Secondary | ICD-10-CM

## 2023-04-30 DIAGNOSIS — I428 Other cardiomyopathies: Secondary | ICD-10-CM | POA: Diagnosis not present

## 2023-04-30 HISTORY — DX: Atherosclerotic heart disease of native coronary artery without angina pectoris: I25.10

## 2023-04-30 NOTE — Progress Notes (Signed)
Cardiology Office Note:    Date:  04/30/2023   ID:  Sean Hodges, DOB 18-Apr-1941, MRN [id number]  PCP:  Annamaria Helling, DO  Cardiologist:  Garwin Brothers, MD   Referring MD: Annamaria Helling, DO    ASSESSMENT:    1. Hypercholesterolemia   2. NICM (nonischemic cardiomyopathy) (HCC)   3. Coronary artery calcification   4. History of cardiomyopathy   5. Primary hypertension    PLAN:    In order of problems listed above:  Coronary artery calcification: Secondary prevention stressed with the patient.  Importance of compliance with diet medication stressed any vocalized understanding.  He is walking 45 minutes a day on a regular basis and I congratulated him about his exercise program. Essential hypertension: Blood pressure stable and diet was emphasized. Mixed dyslipidemia: On lipid-lowering medications followed by primary care.  Goal LDL should be less than 60. History of nonischemic cardiomyopathy: Will do an echocardiogram to monitor his ejection fraction.  It was normal the last time. Patient will be seen in follow-up appointment in 6 months or earlier if the patient has any concerns.    Medication Adjustments/Labs and Tests Ordered: Current medicines are reviewed at length with the patient today.  Concerns regarding medicines are outlined above.  Orders Placed This Encounter  Procedures   EKG 12-Lead   ECHOCARDIOGRAM COMPLETE   No orders of the defined types were placed in this encounter.    No chief complaint on file.    History of Present Illness:    Sean Hodges is a 82 y.o. male.  Patient has past medical history of nonischemic cardiomyopathy.  Ejection fraction has resolved to normal.  He has history of essential hypertension and dyslipidemia.  He is an active gentleman.  He mentions to me that he walks about 45 minutes a day on a regular basis.  At the time of my evaluation, the patient is alert awake oriented and in no distress.  Past  Medical History:  Diagnosis Date   Arthritis    knee osteoarthritis   Benign prostatic hyperplasia 07/03/2016   Cancer (HCC)    nhl   Chronic pain of left knee 11/15/2014   DDD (degenerative disc disease), lumbar 07/03/2016   Disease of thyroid gland 07/03/2016   Overview:  Left Thyroidectomy (original site of lymphoma)   Dyspnea on exertion 08/30/2014   Follicular lymphoma (HCC) 07/29/2011   Diagnosed May 2000.  Stage IV--thyroid involvement.   GERD (gastroesophageal reflux disease) 07/03/2016   History of cardiomyopathy 08/07/2022   Hypercholesterolemia    Hyperlipidemia    Hypertension    Left knee pain 11/15/2014   Loose body of left knee 11/20/2014   Lumbar stenosis 07/03/2016   Lumbar stenosis with neurogenic claudication 09/21/2017   Lymphoma (HCC)    Neuropathy due to chemotherapeutic drug (HCC) 09/17/2013   Nonischemic cardiomyopathy (HCC) 03/07/2015   Port-A-Cath in place 05/01/2020   Postoperative urinary retention 10/05/2017   Precordial chest pain 08/30/2014   S/P left knee arthroscopy 11/26/2014   Spondylosis of lumbar joint 07/03/2016    Past Surgical History:  Procedure Laterality Date   arthroscopies Bilateral    Left eye surgery  2013   THYROIDECTOMY, PARTIAL Left    vocal cord     polyps removed    Current Medications: Current Meds  Medication Sig   Acetaminophen (TYLENOL PO) Take 350 mg by mouth every morning.   aspirin EC 81 MG tablet Take 81 mg by mouth daily.  calcium & magnesium carbonates (MYLANTA) 311-232 MG per tablet Take 1 tablet by mouth 2 (two) times daily.   Cholecalciferol (VITAMIN D3 PO) Take 500 Units by mouth 2 (two) times daily.   COENZYME Q-10 PO Take 50 mg by mouth 2 (two) times daily.    enalapril (VASOTEC) 10 MG tablet Take 1 tablet by mouth daily.   famotidine (PEPCID) 40 MG tablet Take 40 mg by mouth daily.   fish oil-omega-3 fatty acids 1000 MG capsule Take 1 g by mouth daily.   fluorouracil (EFUDEX) 5 % cream Apply 1  Application topically 2 (two) times daily.   glucosamine-chondroitin 500-400 MG tablet Take 1 tablet by mouth 2 (two) times daily.    lidocaine (LIDODERM) 5 % Place 1 patch onto the skin as needed for pain.   pravastatin (PRAVACHOL) 20 MG tablet Take 20 mg by mouth daily.   saw palmetto 500 MG capsule Take 500 mg by mouth 2 (two) times daily.   vitamin B-12 (CYANOCOBALAMIN) 1000 MCG tablet Take 1,000 mcg by mouth daily.    Current Facility-Administered Medications for the 04/30/23 encounter (Office Visit) with Salvadore Valvano, Aundra Dubin, MD  Medication   triamcinolone acetonide (KENALOG-40) injection 20 mg     Allergies:   Gabapentin and Lipitor [atorvastatin]   Social History   Socioeconomic History   Marital status: Married    Spouse name: Not on file   Number of children: 3   Years of education: Not on file   Highest education level: Not on file  Occupational History   Occupation: retired-engineer  Tobacco Use   Smoking status: Former    Current packs/day: 0.00    Types: Cigarettes    Quit date: 01/20/1974    Years since quitting: 49.3   Smokeless tobacco: Former    Quit date: 06/22/1973  Vaping Use   Vaping status: Never Used  Substance and Sexual Activity   Alcohol use: Never   Drug use: Never   Sexual activity: Not on file  Other Topics Concern   Not on file  Social History Narrative   Patient is very active, walks 3 miles a day, former Chief Financial Officer, 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 3 children [place] in [place], 2 in [place]   Social Determinants of Health   Financial Resource Strain: Not on file  Food Insecurity: Not on file  Transportation Needs: Not on file  Physical Activity: Not on file  Stress: Not on file  Social Connections: Not on file     Family History: The patient's family history includes Cancer in his brother, brother, brother, brother, and brother.  ROS:   Please see the history of present illness.     All other systems reviewed and are negative.  EKGs/Labs/Other Studies Reviewed:    The following studies were reviewed today: I discussed my findings with the patient at length.  ..EKG Interpretation Date/Time:  Friday April 30 2023 10:05:01 EST Ventricular Rate:  63 PR Interval:  192 QRS Duration:  96 QT Interval:  396 QTC Calculation: 405 R Axis:   2  Text Interpretation: Sinus rhythm with Premature supraventricular complexes Minimal voltage criteria for LVH, may be normal variant Borderline ECG When compared with ECG of 28-Feb-2009 11:10, Premature supraventricular complexes are now Present Confirmed by Belva Crome [phone]) on 04/30/2023 9:13:41 AM     Recent Labs: 07/10/2022: TSH 2.226 02/09/2023: ALT 12; BUN 22; Creatinine 1.30; Hemoglobin 14.7; Platelets 171; Potassium 4.1; Sodium  137  Recent Lipid Panel    Component Value Date/Time   CHOL 152 08/30/2014 1028   TRIG 90 08/30/2014 1028   HDL 39 (L) 08/30/2014 1028   CHOLHDL 3.9 08/30/2014 1028   VLDL 18 08/30/2014 1028   LDLCALC 95 08/30/2014 1028    Physical Exam:    VS:  BP 136/66   Pulse 63   Ht 5\' 9"  (1.753 m)   Wt 194 lb 6.4 oz (88.2 kg)   SpO2 98%   BMI 28.71 kg/m     Wt Readings from Last 3 Encounters:  04/30/23 194 lb 6.4 oz (88.2 kg)  02/10/23 197 lb 8 oz (89.6 kg)  02/09/23 195 lb 11.2 oz (88.8 kg)     GEN: Patient is in no acute distress HEENT: Normal NECK: No JVD; No carotid bruits LYMPHATICS: No lymphadenopathy CARDIAC: Hear sounds regular, 2/6 systolic murmur at the apex. RESPIRATORY:  Clear to auscultation without rales, wheezing or rhonchi  ABDOMEN: Soft, non-tender, non-distended MUSCULOSKELETAL:  No edema; No deformity  SKIN: Warm and dry NEUROLOGIC:  Alert and oriented x 3 PSYCHIATRIC:  Normal affect   Signed, Garwin Brothers, MD  04/30/2023 9:29 AM    [facility]

## 2023-04-30 NOTE — Patient Instructions (Signed)
Medication Instructions:  Your physician recommends that you continue on your current medications as directed. Please refer to the Current Medication list given to you today.  *If you need a refill on your cardiac medications before your next appointment, please call your pharmacy*   Lab Work: None ordered If you have labs (blood work) drawn today and your tests are completely normal, you will receive your results only by: MyChart Message (if you have MyChart) OR A paper copy in the mail If you have any lab test that is abnormal or we need to change your treatment, we will call you to review the results.   Testing/Procedures: Your physician has requested that you have an echocardiogram. Echocardiography is a painless test that uses sound waves to create images of your heart. It provides your doctor with information about the size and shape of your heart and how well your heart's chambers and valves are working. This procedure takes approximately one hour. There are no restrictions for this procedure. Please do NOT wear cologne, perfume, aftershave, or lotions (deodorant is allowed). Please arrive 15 minutes prior to your appointment time.     Follow-Up: At [facility], you and your health needs are our priority.  As part of our continuing mission to provide you with exceptional heart care, we have created designated Provider Care Teams.  These Care Teams include your primary Cardiologist (physician) and Advanced Practice Providers (APPs -  Physician Assistants and Nurse Practitioners) who all work together to provide you with the care you need, when you need it.  We recommend signing up for the patient portal called "MyChart".  Sign up information is provided on this After Visit Summary.  MyChart is used to connect with patients for Virtual Visits (Telemedicine).  Patients are able to view lab/test results, encounter notes, upcoming appointments, etc.  Non-urgent messages can be sent to  your provider as well.   To learn more about what you can do with MyChart, go to [email or web address].    Your next appointment:   12 month(s)  The format for your next appointment:   In Person  Provider:   Rajan Revankar, MD   Other Instructions Echocardiogram An echocardiogram is a test that uses sound waves (ultrasound) to produce images of the heart. Images from an echocardiogram can provide important information about: Heart size and shape. The size and thickness and movement of your heart's walls. Heart muscle function and strength. Heart valve function or if you have stenosis. Stenosis is when the heart valves are too narrow. If blood is flowing backward through the heart valves (regurgitation). A tumor or infectious growth around the heart valves. Areas of heart muscle that are not working well because of poor blood flow or injury from a heart attack. Aneurysm detection. An aneurysm is a weak or damaged part of an artery wall. The wall bulges out from the normal force of blood pumping through the body. Tell a health care provider about: Any allergies you have. All medicines you are taking, including vitamins, herbs, eye drops, creams, and over-the-counter medicines. Any blood disorders you have. Any surgeries you have had. Any medical conditions you have. Whether you are pregnant or may be pregnant. What are the risks? Generally, this is a safe test. However, problems may occur, including an allergic reaction to dye (contrast) that may be used during the test. What happens before the test? No specific preparation is needed. You may eat and drink normally. What happens during the test? You will   take off your clothes from the waist up and put on a hospital gown. Electrodes or electrocardiogram (ECG)patches may be placed on your chest. The electrodes or patches are then connected to a device that monitors your heart rate and rhythm. You will lie down on a table for an  ultrasound exam. A gel will be applied to your chest to help sound waves pass through your skin. A handheld device, called a transducer, will be pressed against your chest and moved over your heart. The transducer produces sound waves that travel to your heart and bounce back (or "echo" back) to the transducer. These sound waves will be captured in real-time and changed into images of your heart that can be viewed on a video monitor. The images will be recorded on a computer and reviewed by your health care provider. You may be asked to change positions or hold your breath for a short time. This makes it easier to get different views or better views of your heart. In some cases, you may receive contrast through an IV in one of your veins. This can improve the quality of the pictures from your heart. The procedure may vary among health care providers and hospitals.   What can I expect after the test? You may return to your normal, everyday life, including diet, activities, and medicines, unless your health care provider tells you not to do that. Follow these instructions at home: It is up to you to get the results of your test. Ask your health care provider, or the department that is doing the test, when your results will be ready. Keep all follow-up visits. This is important. Summary An echocardiogram is a test that uses sound waves (ultrasound) to produce images of the heart. Images from an echocardiogram can provide important information about the size and shape of your heart, heart muscle function, heart valve function, and other possible heart problems. You do not need to do anything to prepare before this test. You may eat and drink normally. After the echocardiogram is completed, you may return to your normal, everyday life, unless your health care provider tells you not to do that. This information is not intended to replace advice given to you by your health care provider. Make sure you  discuss any questions you have with your health care provider. Document Revised: 01/30/2020 Document Reviewed: 01/30/2020 Elsevier Patient Education  2021 Elsevier Inc.   Important Information About Sugar        

## 2023-05-12 ENCOUNTER — Ambulatory Visit: Payer: PPO | Admitting: Podiatry

## 2023-05-12 ENCOUNTER — Encounter: Payer: Self-pay | Admitting: Podiatry

## 2023-05-12 ENCOUNTER — Ambulatory Visit (INDEPENDENT_AMBULATORY_CARE_PROVIDER_SITE_OTHER): Payer: PPO

## 2023-05-12 DIAGNOSIS — L600 Ingrowing nail: Secondary | ICD-10-CM

## 2023-05-12 DIAGNOSIS — M21612 Bunion of left foot: Secondary | ICD-10-CM | POA: Diagnosis not present

## 2023-05-12 DIAGNOSIS — G629 Polyneuropathy, unspecified: Secondary | ICD-10-CM | POA: Diagnosis not present

## 2023-05-12 MED ORDER — PREGABALIN 75 MG PO CAPS: 75.0000 mg | ORAL_CAPSULE | Freq: Every day | ORAL | 0 refills | Status: DC

## 2023-05-12 NOTE — Patient Instructions (Signed)

## 2023-05-12 NOTE — Progress Notes (Signed)
Subjective:  Patient ID: Sean Hodges, male    DOB: 04/15/1941,  MRN: [id number]  Sean Hodges presents to clinic today for:  Chief Complaint  Patient presents with   Numbness    Nueropathic Pain b/l L>R worse in toes but abormal feeling to ankles. HX chemo for lymphoma 20 years ago and multiple low back surgeries   Toe Pain    Left hallux and MPJ pain worse over the past month or so. Hx hammertoe correction with Dr. Marylene Land 2-5 L.   Patient has an ingrown painful toenail on the left hallux medial nail border.  He also notes he has neuropathy which got worse after his chemotherapy treatment.  Left is worse than right and its mostly in the toes but states that his entire feet can sometimes feel abnormal and burning.  He does have a history of lumbar surgeries.  States that he was placed on gabapentin in the past and did not tolerate this well so he stopped taking the medication.  He notes that he got some inserts from our office in the past and felt that they provided some improvement and asked how long they usually last before the knee replaced.  He notes pain and stiffness around the great toe joint of the left toe.  PCP is Nabors, Scherrie November, DO.  Allergies  Allergen Reactions   Gabapentin Nausea Only and Other (See Comments)    dizziness   Lipitor [Atorvastatin] Other (See Comments)    Myalgias    Review of Systems: Negative except as noted in the HPI.  Objective:  Sean Hodges is a pleasant 82 y.o. male in NAD. AAO x 3.  Vascular Examination: Capillary refill time is 3-5 seconds to toes bilateral. Palpable pedal pulses b/l LE. Digital hair present b/l. No pedal edema b/l. Skin temperature gradient WNL b/l. No varicosities b/l. No cyanosis or clubbing noted b/l.   Dermatological Examination: There is incurvation of the left hallux medial nail border.  There is pain on palpation of the affected nail border.  No active drainage is noted.  No erythema is  noted.  There is some hypertrophic tissue along the medial nail margin.  Neurological Examination: Protective sensation intact with Semmes-Weinstein 10 gram monofilament b/l LE. Vibratory sensation diminished b/l LE.  Light touch sensation diminished to the toes bilateral.  Orthopedic examination: There is pain on palpation and restricted range of motion of the left first MPJ.  No surrounding erythema or edema at the joint level.  Bony enlargement palpated along the dorsal and dorsal medial aspect of the first MPJ left foot  Radiographic examination (left foot, 3 weightbearing views, 05/12/2023): Normal osseous mineralization noted.  Significant joint space narrowing at the first metatarsal-medial cuneiform joint as well as the first metatarsal-phalangeal joint.  Erosive changes seen on the medial aspect of the first metatarsal head as well as a small portion of the medial aspect of the base of the first proximal phalanx.  Dorsal spurring seen at the first metatarsal-medial cuneiform joint on the lateral view.  Inferior calcaneal spur noted.  There is increased soft tissue density the medial aspect of the first metatarsal head.  Tibial sesamoid is bipartite.  This is in position 4.  Assessment/Plan: 1. Bunion of great toe of left foot   2. Ingrown toenail of left foot   3. Neuropathy     Meds ordered this encounter  Medications   pregabalin (LYRICA) 75 MG capsule    Sig: Take 1  capsule (75 mg total) by mouth daily.    Dispense:  30 capsule    Refill:  0   Discussed patient's condition today.  After obtaining patient consent, the left hallux was anesthetized with a 50:50 mixture of 1% lidocaine plain and 0.5% bupivacaine plain for a total of 3cc's administered.  Upon confirmation of anesthesia, a freer elevator was utilized to free the left hallux medial nail border from the nail bed.  The nail border was then avulsed proximal to the eponychium and removed in toto.  The area was inspected for  any remaining spicules.  A chemical matrixectomy was performed with NaOH and neutralized with acetic acid solution.  Antibiotic ointment and a DSD were applied, followed by a Coban dressing.  Patient tolerated the anesthetic and procedure well and will f/u in 2-3 weeks for recheck.  Patient given post-procedure instructions for daily 15-minute Epsom salt soaks, antibiotic ointment and daily use of Bandaids until toe starts to dry / form eschar.   Discussed placing the patient on a 30-day trial of Lyrica to see if this helps his neuropathy symptoms.  Discussed possible side effects of drowsiness initially.  Therefore will only have him take 1 pill daily instead of 1 pill twice daily to see how he tolerates the medication.  Patient fitted for power step size 10-1/2.  This will help offload the first MPJ.  Also recommended Voltaren gel massaged into the joint twice daily as needed.  Based on the radiographic findings and the type of erosions seen at the medial first MPJ, there is suspicion of possible history of gout in this joint.  Return in about 2 weeks (around 05/26/2023) for PNA recheck.   Clerance Lav, DPM, FACFAS [facility]     [address] [place], [place] [address]                Office [phone]  Fax [phone]

## 2023-05-27 DIAGNOSIS — L0100 Impetigo, unspecified: Secondary | ICD-10-CM | POA: Diagnosis not present

## 2023-05-27 DIAGNOSIS — L570 Actinic keratosis: Secondary | ICD-10-CM | POA: Diagnosis not present

## 2023-05-27 DIAGNOSIS — D225 Melanocytic nevi of trunk: Secondary | ICD-10-CM | POA: Diagnosis not present

## 2023-05-27 DIAGNOSIS — L821 Other seborrheic keratosis: Secondary | ICD-10-CM | POA: Diagnosis not present

## 2023-05-27 DIAGNOSIS — L814 Other melanin hyperpigmentation: Secondary | ICD-10-CM | POA: Diagnosis not present

## 2023-05-28 ENCOUNTER — Ambulatory Visit: Payer: PPO | Admitting: Podiatry

## 2023-05-28 DIAGNOSIS — L03032 Cellulitis of left toe: Secondary | ICD-10-CM

## 2023-05-28 MED ORDER — DOXYCYCLINE HYCLATE 100 MG PO CAPS: 100.0000 mg | ORAL_CAPSULE | Freq: Two times a day (BID) | ORAL | 0 refills | Status: AC

## 2023-05-28 NOTE — Progress Notes (Unsigned)
      HPI: 82 y.o. male presents today for a PNA recheck on left hallux medial nail border.    He stated he is also having a little bit of pain in the past couple of days which he was not having previously after the procedure.    Past Medical History:  Diagnosis Date   Arthritis    knee osteoarthritis   Benign prostatic hyperplasia 07/03/2016   Cancer (HCC)    nhl   Chronic pain of left knee 11/15/2014   DDD (degenerative disc disease), lumbar 07/03/2016   Disease of thyroid gland 07/03/2016   Overview:  Left Thyroidectomy (original site of lymphoma)   Dyspnea on exertion 08/30/2014   Follicular lymphoma (HCC) 07/29/2011   Diagnosed May 2000.  Stage IV--thyroid involvement.   GERD (gastroesophageal reflux disease) 07/03/2016   History of cardiomyopathy 08/07/2022   Hypercholesterolemia    Hyperlipidemia    Hypertension    Left knee pain 11/15/2014   Loose body of left knee 11/20/2014   Lumbar stenosis 07/03/2016   Lumbar stenosis with neurogenic claudication 09/21/2017   Lymphoma (HCC)    Neuropathy due to chemotherapeutic drug (HCC) 09/17/2013   Nonischemic cardiomyopathy (HCC) 03/07/2015   Port-A-Cath in place 05/01/2020   Postoperative urinary retention 10/05/2017   Precordial chest pain 08/30/2014   S/P left knee arthroscopy 11/26/2014   Spondylosis of lumbar joint 07/03/2016    Past Surgical History:  Procedure Laterality Date   arthroscopies Bilateral    Left eye surgery  2013   THYROIDECTOMY, PARTIAL Left    vocal cord     polyps removed   Allergies  Allergen Reactions   Gabapentin Nausea Only and Other (See Comments)    dizziness   Lipitor [Atorvastatin] Other (See Comments)    Myalgias    Physical Exam: The area is mildly macerated and there is localized cellulitis which has popped up in the past 3 days.  This is isolated to the proximal nail fold along the medial nail border.  Minimal localized edema in the same area.  Palpable pedal pulses  noted.  Assessment/Plan of Care: 1. Cellulitis of left toe     Meds ordered this encounter  Medications   doxycycline (VIBRAMYCIN) 100 MG capsule    Sig: Take 1 capsule (100 mg total) by mouth 2 (two) times daily for 7 days.    Dispense:  14 capsule    Refill:  0   Discussed clinical findings with patient today.  Will get him started on an oral antibiotic and have him switch to iodine ointment to the area as well as letting the toe air out and D/C Band-Aids - only use gauze wrapping on the toe.  Recheck in 1 to 2 weeks   Topaz Raglin DBurna Mortimer, DPM, FACFAS [facility]     [address] [place], [place] [address]                Office [phone]  Fax [phone]

## 2023-05-31 DIAGNOSIS — Z0000 Encounter for general adult medical examination without abnormal findings: Secondary | ICD-10-CM | POA: Diagnosis not present

## 2023-05-31 DIAGNOSIS — Z79899 Other long term (current) drug therapy: Secondary | ICD-10-CM | POA: Diagnosis not present

## 2023-05-31 DIAGNOSIS — Z6828 Body mass index (BMI) 28.0-28.9, adult: Secondary | ICD-10-CM | POA: Diagnosis not present

## 2023-05-31 DIAGNOSIS — E782 Mixed hyperlipidemia: Secondary | ICD-10-CM | POA: Diagnosis not present

## 2023-05-31 DIAGNOSIS — Z1331 Encounter for screening for depression: Secondary | ICD-10-CM | POA: Diagnosis not present

## 2023-05-31 DIAGNOSIS — G629 Polyneuropathy, unspecified: Secondary | ICD-10-CM | POA: Diagnosis not present

## 2023-05-31 LAB — LAB REPORT - SCANNED
A1c: 5.3
Free T4: 1.03 ng/dL
TSH: 2.3 (ref 0.41–5.90)

## 2023-06-09 ENCOUNTER — Ambulatory Visit: Payer: PPO | Attending: Cardiology

## 2023-06-09 DIAGNOSIS — I428 Other cardiomyopathies: Secondary | ICD-10-CM | POA: Diagnosis not present

## 2023-06-09 DIAGNOSIS — I2510 Atherosclerotic heart disease of native coronary artery without angina pectoris: Secondary | ICD-10-CM

## 2023-06-09 MED ORDER — PERFLUTREN LIPID MICROSPHERE
1.0000 mL | INTRAVENOUS | Status: AC | PRN
  Administered 2023-06-09: 10 mL via INTRAVENOUS

## 2023-06-10 ENCOUNTER — Ambulatory Visit: Payer: PPO | Admitting: Podiatry

## 2023-06-10 DIAGNOSIS — L03032 Cellulitis of left toe: Secondary | ICD-10-CM

## 2023-06-10 LAB — ECHOCARDIOGRAM COMPLETE
Area-P 1/2: 3.71 cm2
P 1/2 time: 595 ms
S' Lateral: 3.5 cm

## 2023-06-10 NOTE — Progress Notes (Deleted)
[facility]  [address] [place],  [place]  [address] [phone]  Clinic Day:  02/09/2023  Referring physician:  [facility]  HISTORY OF PRESENT ILLNESS:  The patient is an 82 y.o. male with recurrent low grade follicular lymphoma.  There is also history of diffuse large B cell lymphoma.  He last completed 6 cycles of Revlimid/Rituxan in October 2023.  His PET afterwards showed a complete response to therapy.  He comes in today for routine follow-up.  Since his last visit, the patient has been doing well.  He denies having any B symptoms or new lymphadenopathy which concerns him for disease recurrence.    With respect to his lymphoma history, his diffuse large B-cell lymphoma was initially diagnosed in May 2000 per biopsy of a mass in his left thyroid.  As CT scans showed evidence of disease above and below his diaphragm, he was given 6 cycles of CHOP chemotherapy, followed by weekly Rituxan for 8 treatments.  His disease did well up until August 2008 when CT and PET scans showed evidence of disease recurrence.  At that time, a repeat biopsy showed follicular lymphoma, which led to him undergoing 6 cycles of Rituxan-CVP.  The plan was to continue maintenance Rituxan for 2 years, but the patient only received 1 cycle of maintenance Rituxan before discontinuing it in early 2009.  The patient has had biopsies and resections of a fleshy skin nodule behind his right ear, whose pathology revealed follicular lymphoma, but with focal areas within it suggesting potential transformation into a more aggressive subtype.  However, this area never flared up to where intervention has been necessary.  He began having problems with his right eyelid, for which a biopsy of the area confirmed the presence of recurrent low-grade follicular lymphoma.  This biopsy and systemic disease spread on a PET scan led to him being placed on Revlimid/Rituxan, which was his first  systemic therapy since 2009.  He completed 6 cycles of this regimen in October 2023, with a PET scan afterwards showing a complete response.    PHYSICAL EXAM  There were no vitals taken for this visit. Wt Readings from Last 3 Encounters:  04/30/23 194 lb 6.4 oz (88.2 kg)  02/10/23 197 lb 8 oz (89.6 kg)  02/09/23 195 lb 11.2 oz (88.8 kg)   There is no height or weight on file to calculate BMI. Performance status (ECOG): 1 Physical Exam Constitutional:      Appearance: Normal appearance. He is not ill-appearing.  HENT:     Mouth/Throat:     Mouth: Mucous membranes are moist.     Pharynx: Oropharynx is clear. No oropharyngeal exudate or posterior oropharyngeal erythema.  Eyes:     Comments: No evidence of any lower right eyelid thickening, consistent with positive treatment response  Cardiovascular:     Rate and Rhythm: Normal rate and regular rhythm.     Heart sounds: No murmur heard.    No friction rub. No gallop.  Pulmonary:     Effort: Pulmonary effort is normal. No respiratory distress.     Breath sounds: Normal breath sounds. No wheezing, rhonchi or rales.  Abdominal:     General: Bowel sounds are normal. There is no distension.     Palpations: Abdomen is soft. There is no mass.     Tenderness: There is no abdominal tenderness.  Musculoskeletal:        General: No swelling.     Right lower leg:  No edema.     Left lower leg: No edema.  Lymphadenopathy:     Cervical: No cervical adenopathy.     Upper Body:     Right upper body: No supraclavicular or axillary adenopathy.     Left upper body: No supraclavicular or axillary adenopathy.     Lower Body: No right inguinal adenopathy. No left inguinal adenopathy.  Skin:    General: Skin is warm.     Coloration: Skin is not jaundiced.     Findings: No lesion or rash.     Comments: Diffusely dry skin with numerous brown-colored flat moles over most of his body  Neurological:     General: No focal deficit present.     Mental  Status: He is alert and oriented to person, place, and time. Mental status is at baseline.  Psychiatric:        Mood and Affect: Mood normal.        Behavior: Behavior normal.        Thought Content: Thought content normal.    LABS:    Latest Reference Range & Units 02/09/23 08:51  Sodium 135 - 145 mmol/L 137  Potassium 3.5 - 5.1 mmol/L 4.1  Chloride 98 - 111 mmol/L 100  CO2 22 - 32 mmol/L 29  Glucose 70 - 99 mg/dL 161 (H)  BUN 8 - 23 mg/dL 22  Creatinine 0.96 - 0.45 mg/dL 4.09 (H)  Calcium 8.9 - 10.3 mg/dL 8.9  Anion gap 5 - 15  8  Alkaline Phosphatase 38 - 126 U/L 43  Albumin 3.5 - 5.0 g/dL 4.0  AST 15 - 41 U/L 10 (L)  ALT 0 - 44 U/L 12  Total Protein 6.5 - 8.1 g/dL 6.3 (L)  Total Bilirubin 0.3 - 1.2 mg/dL 0.7  GFR, Est Non African American >60 mL/min 55 (L)  LDH 98 - 192 U/L 128  (H): Data is abnormally high (L): Data is abnormally low  ASSESSMENT & PLAN:  Assessment/Plan:  An 82 y.o. male with biopsy-proven recurrence of low-grade follicular lymphoma.  He also has a history of diffuse large B-cell lymphoma.  Based upon his physical exam and labs today, the patient remains disease free.  Clinically, he is doing very well.  As that is the case, I will see him back in 4 months for repeat clinical assessment.  The patient understands all the plans discussed today and is in agreement with them.  Jadarrius Maselli Kirby Funk, MD

## 2023-06-10 NOTE — Progress Notes (Signed)
       Subjective:  Patient ID: Sean Hodges, male    DOB: 06-17-1941,  MRN: [id number]  Chief Complaint  Patient presents with   fu infection    Left great toe fu. Pt states it is doing better this week, no pain in the last 3 days. Been using iodine, completed abx. Appears a little swollen at base of nail. Is using mupirsprone at base.    Sean Hodges presents to clinic today for f/u of PNA to the left great toe.  He took the antibiotics that were prescribed.  He states that it is feeling and looking better.  PCP is Nabors, Scherrie November, DO.  Allergies  Allergen Reactions   Gabapentin Nausea Only and Other (See Comments)    dizziness   Lipitor [Atorvastatin] Other (See Comments)    Myalgias     Objective:  Vascular Examination: Capillary refill time is 3-5 seconds to toes bilateral. Palpable pedal pulses b/l LE. Digital hair present b/l. No pedal edema b/l. Skin temperature gradient WNL b/l. No varicosities b/l. No cyanosis or clubbing noted b/l.   Dermatological Examination: Upon inspection of the PNA site, there are no clinical signs of infection.  No purulence, no necrosis, no malodor present.  Minimal to no erythema present.  Eschar formed along nail margin.  Minimal to no pain on palpation of area.   Assessment/Plan: 1. Cellulitis of left toe    He can discontinue soaks.  Follow-up as needed.  Decrease mupirocin ointment.  The callus along the medial nail margin may be shaved at some point in the future if not softened sufficiently by the mupirocin ointment.  Betadine solution and a Band-Aid were applied to the toe today.  Follow-up as needed  Clerance Lav, DPM, FACFAS [facility]     [address] [place], [place] [address]                Office [phone]  Fax [phone]

## 2023-06-11 ENCOUNTER — Inpatient Hospital Stay: Payer: PPO | Attending: Oncology

## 2023-06-11 ENCOUNTER — Telehealth: Payer: Self-pay | Admitting: Oncology

## 2023-06-11 ENCOUNTER — Inpatient Hospital Stay (HOSPITAL_BASED_OUTPATIENT_CLINIC_OR_DEPARTMENT_OTHER): Payer: PPO | Admitting: Oncology

## 2023-06-11 ENCOUNTER — Other Ambulatory Visit: Payer: Self-pay | Admitting: Oncology

## 2023-06-11 VITALS — BP 164/76 | HR 64 | Temp 97.5°F | Resp 18 | Ht 69.0 in | Wt 194.0 lb

## 2023-06-11 DIAGNOSIS — C8298 Follicular lymphoma, unspecified, lymph nodes of multiple sites: Secondary | ICD-10-CM

## 2023-06-11 DIAGNOSIS — C8290 Follicular lymphoma, unspecified, unspecified site: Secondary | ICD-10-CM | POA: Insufficient documentation

## 2023-06-11 LAB — CBC WITH DIFFERENTIAL (CANCER CENTER ONLY)
Abs Immature Granulocytes: 0.03 10*3/uL (ref 0.00–0.07)
Basophils Absolute: 0.1 10*3/uL (ref 0.0–0.1)
Basophils Relative: 1 %
Eosinophils Absolute: 0.3 10*3/uL (ref 0.0–0.5)
Eosinophils Relative: 3 %
HCT: 41.6 % (ref 39.0–52.0)
Hemoglobin: 14.4 g/dL (ref 13.0–17.0)
Immature Granulocytes: 0 %
Lymphocytes Relative: 32 %
Lymphs Abs: 2.4 10*3/uL (ref 0.7–4.0)
MCH: 30.1 pg (ref 26.0–34.0)
MCHC: 34.6 g/dL (ref 30.0–36.0)
MCV: 87 fL (ref 80.0–100.0)
Monocytes Absolute: 0.8 10*3/uL (ref 0.1–1.0)
Monocytes Relative: 12 %
Neutro Abs: 3.8 10*3/uL (ref 1.7–7.7)
Neutrophils Relative %: 52 %
Platelet Count: 175 10*3/uL (ref 150–400)
RBC: 4.78 MIL/uL (ref 4.22–5.81)
RDW: 13.4 % (ref 11.5–15.5)
WBC Count: 7.3 10*3/uL (ref 4.0–10.5)
nRBC: 0 % (ref 0.0–0.2)
nRBC: 0 /100{WBCs}

## 2023-06-11 LAB — CMP (CANCER CENTER ONLY)
ALT: 8 U/L (ref 0–44)
AST: 12 U/L — ABNORMAL LOW (ref 15–41)
Albumin: 4.5 g/dL (ref 3.5–5.0)
Alkaline Phosphatase: 54 U/L (ref 38–126)
Anion gap: 9 (ref 5–15)
BUN: 20 mg/dL (ref 8–23)
CO2: 30 mmol/L (ref 22–32)
Calcium: 9.8 mg/dL (ref 8.9–10.3)
Chloride: 102 mmol/L (ref 98–111)
Creatinine: 1.3 mg/dL — ABNORMAL HIGH (ref 0.61–1.24)
GFR, Estimated: 54 mL/min — ABNORMAL LOW (ref >60)
Glucose, Bld: 97 mg/dL (ref 70–99)
Potassium: 4.2 mmol/L (ref 3.5–5.1)
Sodium: 141 mmol/L (ref 135–145)
Total Bilirubin: 0.4 mg/dL (ref <1.2)
Total Protein: 6.6 g/dL (ref 6.5–8.1)

## 2023-06-11 LAB — LACTATE DEHYDROGENASE: LDH: 122 U/L (ref 98–192)

## 2023-06-11 NOTE — Progress Notes (Signed)
[facility]  [address] [place],  [place]  [address] [phone]  Clinic Day:  06/11/2023  Referring physician: Annamaria Helling, DO   HISTORY OF PRESENT ILLNESS:  The patient is a 82 y.o. male with recurrent low grade follicular lymphoma.  There is also history of diffuse large B cell lymphoma.  He last completed 6 cycles of Revlimid/Rituxan in October 2023.  His PET afterwards showed a complete response to therapy.  He comes in today for routine follow-up.  Since his last visit, the patient has been doing well.  He denies having any B symptoms or new lymphadenopathy which concerns him for disease recurrence.    With respect to his lymphoma history, his diffuse large B-cell lymphoma was initially diagnosed in May 2000 per biopsy of a mass in his left thyroid.  As CT scans showed evidence of disease above and below his diaphragm, he was given 6 cycles of CHOP chemotherapy, followed by weekly Rituxan for 8 treatments.  His disease did well up until August 2008 when CT and PET scans showed evidence of disease recurrence.  At that time, a repeat biopsy showed follicular lymphoma, which led to him undergoing 6 cycles of Rituxan-CVP.  The plan was to continue maintenance Rituxan for 2 years, but the patient only received 1 cycle of maintenance Rituxan before discontinuing it in early 2009.  The patient has had biopsies and resections of a fleshy skin nodule behind his right ear, whose pathology revealed follicular lymphoma, but with focal areas within it suggesting potential transformation into a more aggressive subtype.  However, this area never flared up to where intervention has been necessary.  He began having problems with his right eyelid, for which a biopsy of the area confirmed the presence of recurrent low-grade follicular lymphoma.  This biopsy and systemic disease spread on a PET scan led to him being placed on Revlimid/Rituxan, which was his first systemic  therapy since 2009.  He completed 6 cycles of this regimen in October 2023, with a PET scan afterwards showing a complete response.      PHYSICAL EXAM:  Blood pressure (!) 164/76, pulse 64, temperature (!) 97.5 F (36.4 C), temperature source Oral, resp. rate 18, height 5\' 9"  (1.753 m), weight 194 lb (88 kg), SpO2 99%. Wt Readings from Last 3 Encounters:  06/11/23 194 lb (88 kg)  04/30/23 194 lb 6.4 oz (88.2 kg)  02/10/23 197 lb 8 oz (89.6 kg)   Body mass index is 28.65 kg/m. Performance status (ECOG): 1 - Symptomatic but completely ambulatory Physical Exam Constitutional:      Appearance: Normal appearance. He is not ill-appearing.  HENT:     Mouth/Throat:     Mouth: Mucous membranes are moist.     Pharynx: Oropharynx is clear. No oropharyngeal exudate or posterior oropharyngeal erythema.  Cardiovascular:     Rate and Rhythm: Normal rate and regular rhythm.     Heart sounds: No murmur heard.    No friction rub. No gallop.  Pulmonary:     Effort: Pulmonary effort is normal. No respiratory distress.     Breath sounds: Normal breath sounds. No wheezing, rhonchi or rales.  Abdominal:     General: Bowel sounds are normal. There is no distension.     Palpations: Abdomen is soft. There is no mass.     Tenderness: There is no abdominal tenderness.  Musculoskeletal:        General: No swelling.     Right  lower leg: No edema.     Left lower leg: No edema.  Lymphadenopathy:     Cervical: No cervical adenopathy.     Upper Body:     Right upper body: No supraclavicular or axillary adenopathy.     Left upper body: No supraclavicular or axillary adenopathy.     Lower Body: No right inguinal adenopathy. No left inguinal adenopathy.  Skin:    General: Skin is warm.     Coloration: Skin is not jaundiced.     Findings: No lesion or rash.  Neurological:     General: No focal deficit present.     Mental Status: He is alert and oriented to person, place, and time. Mental status is at  baseline.  Psychiatric:        Mood and Affect: Mood normal.        Behavior: Behavior normal.        Thought Content: Thought content normal.     LABS:      Latest Ref Rng & Units 06/11/2023    9:25 AM 02/09/2023   12:00 AM 10/08/2022   12:00 AM  CBC  WBC 4.0 - 10.5 K/uL 7.3  7.4     5.9      Hemoglobin 13.0 - 17.0 g/dL 40.9  81.1     91.4      Hematocrit 39.0 - 52.0 % 41.6  43     42      Platelets 150 - 400 K/uL 175  171     183         This result is from an external source.      Latest Ref Rng & Units 06/11/2023    9:25 AM 02/09/2023    8:51 AM 10/08/2022   12:00 AM  CMP  Glucose 70 - 99 mg/dL 97  782    BUN 8 - 23 mg/dL 20  22  20       Creatinine 0.61 - 1.24 mg/dL 9.56  2.13  1.2      Sodium 135 - 145 mmol/L 141  137  137      Potassium 3.5 - 5.1 mmol/L 4.2  4.1  4.5      Chloride 98 - 111 mmol/L 102  100  101      CO2 22 - 32 mmol/L 30  29  29       Calcium 8.9 - 10.3 mg/dL 9.8  8.9  9.1      Total Protein 6.5 - 8.1 g/dL 6.6  6.3    Total Bilirubin <1.2 mg/dL 0.4  0.7    Alkaline Phos 38 - 126 U/L 54  43  63      AST 15 - 41 U/L 12  10  14       ALT 0 - 44 U/L 8  12  13          This result is from an external source.    Lab Results  Component Value Date   LDH 122 06/11/2023   LDH 128 02/09/2023   LDH 151 10/08/2022    ASSESSMENT & PLAN:  Assessment/Plan:  A 82 y.o. male with biopsy-proven recurrence of low-grade follicular lymphoma.  He also has a history of diffuse large B-cell lymphoma.  Based upon his physical exam and labs today, the patient remains disease free.  Clinically, he is doing very well.  As that is the case, I do feel comfortable spacing his future visits out to every 6 months.  The  patient understands all the plans discussed today and is in agreement with them.     Shabre Kreher Kirby Funk, MD

## 2023-06-11 NOTE — Telephone Encounter (Signed)
06/11/23 Spoke with patient and scheduled next appt

## 2023-06-30 ENCOUNTER — Encounter: Payer: Self-pay | Admitting: Cardiology

## 2023-06-30 ENCOUNTER — Ambulatory Visit: Payer: PPO | Attending: Cardiology | Admitting: Cardiology

## 2023-06-30 VITALS — BP 146/74 | HR 64 | Ht 69.0 in | Wt 194.6 lb

## 2023-06-30 DIAGNOSIS — E782 Mixed hyperlipidemia: Secondary | ICD-10-CM

## 2023-06-30 DIAGNOSIS — I428 Other cardiomyopathies: Secondary | ICD-10-CM | POA: Diagnosis not present

## 2023-06-30 DIAGNOSIS — I2510 Atherosclerotic heart disease of native coronary artery without angina pectoris: Secondary | ICD-10-CM | POA: Diagnosis not present

## 2023-06-30 MED ORDER — ROSUVASTATIN CALCIUM 20 MG PO TABS: 20.0000 mg | ORAL_TABLET | Freq: Every day | ORAL | 3 refills | Status: DC

## 2023-06-30 NOTE — Patient Instructions (Signed)
 Medication Instructions:  Your physician has recommended you make the following change in your medication:   Stop Pravastatin  Start Rosuvastatin  20 mg daily  *If you need a refill on your cardiac medications before your next appointment, please call your pharmacy*   Lab Work: Your physician recommends that you have a CMP today in the office.  Your physician recommends that you return for lab work in: 6 weeks for fasting lipids and CMP You need to have labs done when you are fasting.  You can come Monday through Friday 8:30 am to 12:00 pm and 1:15 to 4:30. You do not need to make an appointment as the order has already been placed.   If you have labs (blood work) drawn today and your tests are completely normal, you will receive your results only by: MyChart Message (if you have MyChart) OR A paper copy in the mail If you have any lab test that is abnormal or we need to change your treatment, we will call you to review the results.   Testing/Procedures:   [facility] at [facility] [address] [place], [place] [address] Phone: [phone]   Please arrive 15 minutes prior to your appointment time for registration and insurance purposes.  The test will take approximately 3 to 4 hours to complete; you may bring reading material.  If someone comes with you to your appointment, they will need to remain in the main lobby due to limited space in the testing area.   How to prepare for your Myocardial Perfusion Test: Do not eat or drink 3 hours prior to your test, except you may have water. Do not consume products containing caffeine (regular or decaffeinated) 12 hours prior to your test. (ex: coffee, chocolate, sodas, tea). Do bring a list of your current medications with you.  If not listed below, you may take your medications as normal. Do wear comfortable clothes (no dresses or overalls) and walking shoes, tennis shoes preferred (No heels or open toe shoes are  allowed). Do NOT wear cologne, perfume, aftershave, or lotions (deodorant is allowed). If these instructions are not followed, your test will have to be rescheduled.  If you cannot keep your appointment, please provide 24 hours notification to the Nuclear Lab, to avoid a possible $50 charge to your account.     Your physician has requested that you have an echocardiogram before your 6 month FU. Echocardiography is a painless test that uses sound waves to create images of your heart. It provides your doctor with information about the size and shape of your heart and how well your heart's chambers and valves are working. This procedure takes approximately one hour. There are no restrictions for this procedure. Please do NOT wear cologne, perfume, aftershave, or lotions (deodorant is allowed). Please arrive 15 minutes prior to your appointment time.  Please note: We ask at that you not bring children with you during ultrasound (echo/ vascular) testing. Due to room size and safety concerns, children are not allowed in the ultrasound rooms during exams. Our front office staff cannot provide observation of children in our lobby area while testing is being conducted. An adult accompanying a patient to their appointment will only be allowed in the ultrasound room at the discretion of the ultrasound technician under special circumstances. We apologize for any inconvenience.    Follow-Up: At [facility], you and your health needs are our priority.  As part of our continuing mission to provide you with exceptional  heart care, we have created designated Provider Care Teams.  These Care Teams include your primary Cardiologist (physician) and Advanced Practice Providers (APPs -  Physician Assistants and Nurse Practitioners) who all work together to provide you with the care you need, when you need it.  We recommend signing up for the patient portal called MyChart.  Sign up information is provided on  this After Visit Summary.  MyChart is used to connect with patients for Virtual Visits (Telemedicine).  Patients are able to view lab/test results, encounter notes, upcoming appointments, etc.  Non-urgent messages can be sent to your provider as well.   To learn more about what you can do with MyChart, go to [email or web address].    Your next appointment:   6 month(s)  Provider:   Jennifer Crape, MD   Other Instructions  Cardiac Nuclear Scan A cardiac nuclear scan is a test that is done to check the flow of blood to your heart. It is done when you are resting and when you are exercising. The test looks for problems such as: Not enough blood reaching a portion of the heart. The heart muscle not working as it should. You may need this test if you have: Heart disease. Lab results that are not normal. Had heart surgery or a balloon procedure to open up blocked arteries (angioplasty) or a small mesh tube (stent). Chest pain. Shortness of breath. Had a heart attack. In this test, a special dye (tracer) is put into your bloodstream. The tracer will travel to your heart. A camera will then take pictures of your heart to see how the tracer moves through your heart. This test is usually done at a hospital and takes 2-4 hours. Tell a doctor about: Any allergies you have. All medicines you are taking, including vitamins, herbs, eye drops, creams, and over-the-counter medicines. Any bleeding problems you have. Any surgeries you have had. Any medical conditions you have. Whether you are pregnant or may be pregnant. Any history of asthma or long-term (chronic) lung disease. Any history of heart rhythm disorders or heart valve conditions. What are the risks? Your doctor will talk with you about risks. These may include: Serious chest pain and heart attack. This is only a risk if the stress portion of the test is done. Fast or uneven heartbeats (palpitations). A feeling of warmth in your chest.  This feeling usually does not last long. Allergic reaction to the tracer. Shortness of breath or trouble breathing. What happens before the test? Ask your doctor about changing or stopping your normal medicines. Follow instructions from your doctor about what you cannot eat or drink. Remove your jewelry on the day of the test. Ask your doctor if you need to avoid nicotine or caffeine. What happens during the test? An IV tube will be inserted into one of your veins. Your doctor will give you a small amount of tracer through the IV tube. You will wait for 20-40 minutes while the tracer moves through your bloodstream. Your heart will be monitored with an electrocardiogram (ECG). You will lie down on an exam table. Pictures of your heart will be taken for about 15-20 minutes. You may also have a stress test. For this test, one of these things may be done: You will be asked to exercise on a treadmill or a stationary bike. You will be given medicines that will make your heart work harder. This is done if you are unable to exercise. When blood flow to your heart has peaked,  a tracer will again be given through the IV tube. After 20-40 minutes, you will get back on the exam table. More pictures will be taken of your heart. Depending on the tracer that is used, more pictures may need to be taken 3-4 hours later. Your IV tube will be removed when the test is over. The test may vary among doctors and hospitals. What happens after the test? Ask your doctor: Whether you can return to your normal schedule, including diet, activities, travel, and medicines. Whether you should drink more fluids. This will help to remove the tracer from your body. Ask your doctor, or the department that is doing the test: When will my results be ready? How will I get my results? What are my treatment options? What other tests do I need? What are my next steps? This information is not intended to replace advice given  to you by your health care provider. Make sure you discuss any questions you have with your health care provider. Document Revised: 11/04/2021 Document Reviewed: 11/04/2021 Elsevier Patient Education  2023 Elsevier Inc.  Echocardiogram An echocardiogram is a test that uses sound waves (ultrasound) to produce images of the heart. Images from an echocardiogram can provide important information about: Heart size and shape. The size and thickness and movement of your heart's walls. Heart muscle function and strength. Heart valve function or if you have stenosis. Stenosis is when the heart valves are too narrow. If blood is flowing backward through the heart valves (regurgitation). A tumor or infectious growth around the heart valves. Areas of heart muscle that are not working well because of poor blood flow or injury from a heart attack. Aneurysm detection. An aneurysm is a weak or damaged part of an artery wall. The wall bulges out from the normal force of blood pumping through the body. Tell a health care provider about: Any allergies you have. All medicines you are taking, including vitamins, herbs, eye drops, creams, and over-the-counter medicines. Any blood disorders you have. Any surgeries you have had. Any medical conditions you have. Whether you are pregnant or may be pregnant. What are the risks? Generally, this is a safe test. However, problems may occur, including an allergic reaction to dye (contrast) that may be used during the test. What happens before the test? No specific preparation is needed. You may eat and drink normally. What happens during the test?  You will take off your clothes from the waist up and put on a hospital gown. Electrodes or electrocardiogram (ECG)patches may be placed on your chest. The electrodes or patches are then connected to a device that monitors your heart rate and rhythm. You will lie down on a table for an ultrasound exam. A gel will be  applied to your chest to help sound waves pass through your skin. A handheld device, called a transducer, will be pressed against your chest and moved over your heart. The transducer produces sound waves that travel to your heart and bounce back (or echo back) to the transducer. These sound waves will be captured in real-time and changed into images of your heart that can be viewed on a video monitor. The images will be recorded on a computer and reviewed by your health care provider. You may be asked to change positions or hold your breath for a short time. This makes it easier to get different views or better views of your heart. In some cases, you may receive contrast through an IV in one of your  veins. This can improve the quality of the pictures from your heart. The procedure may vary among health care providers and hospitals. What can I expect after the test? You may return to your normal, everyday life, including diet, activities, and medicines, unless your health care provider tells you not to do that. Follow these instructions at home: It is up to you to get the results of your test. Ask your health care provider, or the department that is doing the test, when your results will be ready. Keep all follow-up visits. This is important. Summary An echocardiogram is a test that uses sound waves (ultrasound) to produce images of the heart. Images from an echocardiogram can provide important information about the size and shape of your heart, heart muscle function, heart valve function, and other possible heart problems. You do not need to do anything to prepare before this test. You may eat and drink normally. After the echocardiogram is completed, you may return to your normal, everyday life, unless your health care provider tells you not to do that. This information is not intended to replace advice given to you by your health care provider. Make sure you discuss any questions you have with  your health care provider. Document Revised: 02/19/2021 Document Reviewed: 01/30/2020 Elsevier Patient Education  2023 Elsevier Inc.    Important Information About Sugar

## 2023-06-30 NOTE — Progress Notes (Signed)
 Cardiology Office Note:    Date:  06/30/2023   ID:  Sean Hodges, DOB 03-07-1941, MRN [id number]  PCP:  Conley Dene BROCKS, DO  Cardiologist:  Jennifer JONELLE Crape, MD   Referring MD: Conley Dene BROCKS, DO    ASSESSMENT:    1. Coronary artery calcification   2. Nonischemic cardiomyopathy (HCC)   3. Mixed hyperlipidemia    PLAN:    In order of problems listed above:  Coronary artery calcification found on CT scan and mildly depressed ejection fraction: I discussed my findings with the patient at length.  He would be best served by doing a exercise stress Cardiolite to assess for any objective evidence of progression of coronary artery disease.  He is agreeable.  He has significant calcification on recent CT scan.  I prefer not to do invasive evaluation on this gentleman in view of renal insufficiency. Essential hypertension: Blood pressure is stable and diet was emphasized.  Follow-up blood pressure was 132/70 at the clinic.  He has an element of whitecoat hypertension. Mixed dyslipidemia: On lipid-lowering medications.  Not at goal.  Will have baseline blood work and switch his medications to rosuvastatin  20 mg daily.  Liver lipid check will be in 6 months. Renal insufficiency: Stable and monitored by primary care. Patient will be seen in follow-up appointment in 6 months or earlier if the patient has any concerns.    Medication Adjustments/Labs and Tests Ordered: Current medicines are reviewed at length with the patient today.  Concerns regarding medicines are outlined above.  No orders of the defined types were placed in this encounter.  No orders of the defined types were placed in this encounter.    No chief complaint on file.    History of Present Illness:    Sean Hodges is a 83 y.o. male.  Patient has history of coronary artery calcification, essential hypertension, mixed dyslipidemia and mildly reduced ejection fraction.  He has history of  cardiomyopathy.  He denies any problems at this time and takes care of activities of daily living.  No chest pain orthopnea or PND.  At the time of my evaluation, the patient is alert awake oriented and in no distress.  She has an element of whitecoat hypertension.  His blood pressures at home are fine.  He walks more than 45 minutes a day on a daily basis.  Past Medical History:  Diagnosis Date   Arthritis    knee osteoarthritis   Benign prostatic hyperplasia 07/03/2016   Cancer (HCC)    nhl   Chronic pain of left knee 11/15/2014   Coronary artery calcification 04/30/2023   DDD (degenerative disc disease), lumbar 07/03/2016   Disease of thyroid  gland 07/03/2016   Overview:  Left Thyroidectomy (original site of lymphoma)   Dyspnea on exertion 08/30/2014   Follicular lymphoma (HCC) 07/29/2011   Diagnosed May 2000.  Stage IV--thyroid  involvement.   GERD (gastroesophageal reflux disease) 07/03/2016   History of cardiomyopathy 08/07/2022   Hypercholesterolemia    Hyperlipidemia    Hypertension    Left knee pain 11/15/2014   Loose body of left knee 11/20/2014   Lumbar stenosis 07/03/2016   Lumbar stenosis with neurogenic claudication 09/21/2017   Lymphoma (HCC)    Neuropathy due to chemotherapeutic drug (HCC) 09/17/2013   Nonischemic cardiomyopathy (HCC) 03/07/2015   Port-A-Cath in place 05/01/2020   Postoperative urinary retention 10/05/2017   Precordial chest pain 08/30/2014   S/P left knee arthroscopy 11/26/2014   Spondylosis of lumbar joint 07/03/2016  Past Surgical History:  Procedure Laterality Date   arthroscopies Bilateral    Left eye surgery  2013   THYROIDECTOMY, PARTIAL Left    vocal cord     polyps removed    Current Medications: Current Meds  Medication Sig   Acetaminophen  (TYLENOL  PO) Take 100 mg by mouth daily.   aspirin EC 81 MG tablet Take 81 mg by mouth daily.   calcium  & magnesium carbonates (MYLANTA) 311-232 MG per tablet Take 1 tablet by mouth 2  (two) times daily.   Cholecalciferol (VITAMIN D3 PO) Take 500 Units by mouth 2 (two) times daily.   COENZYME Q-10 PO Take 100 mg by mouth daily.   enalapril (VASOTEC) 10 MG tablet Take 1 tablet by mouth daily.   famotidine (PEPCID) 40 MG tablet Take 40 mg by mouth daily.   fish oil-omega-3 fatty acids 1000 MG capsule Take 1 g by mouth daily.   fluorouracil (EFUDEX) 5 % cream Apply 1 Application topically 2 (two) times daily.   gabapentin  (NEURONTIN ) 100 MG capsule Take 100 mg by mouth at bedtime.   glucosamine-chondroitin 500-400 MG tablet Take 1 tablet by mouth 2 (two) times daily.    lidocaine  (LIDODERM ) 5 % Place 1 patch onto the skin as needed for pain.   pravastatin (PRAVACHOL) 20 MG tablet Take 20 mg by mouth daily.   saw palmetto 500 MG capsule Take 500 mg by mouth 2 (two) times daily.   vitamin B-12 (CYANOCOBALAMIN ) 1000 MCG tablet Take 1,000 mcg by mouth daily.    Current Facility-Administered Medications for the 06/30/23 encounter (Office Visit) with Aleshia Cartelli, Jennifer SAUNDERS, MD  Medication   triamcinolone  acetonide (KENALOG -40) injection 20 mg     Allergies:   Gabapentin  and Lipitor [atorvastatin]   Social History   Socioeconomic History   Marital status: Married    Spouse name: Not on file   Number of children: 3   Years of education: Not on file   Highest education level: Not on file  Occupational History   Occupation: retired-engineer  Tobacco Use   Smoking status: Former    Current packs/day: 0.00    Types: Cigarettes    Quit date: 01/20/1974    Years since quitting: 49.4   Smokeless tobacco: Former    Quit date: 06/22/1973  Vaping Use   Vaping status: Never Used  Substance and Sexual Activity   Alcohol use: Never   Drug use: Never   Sexual activity: Not on file  Other Topics Concern   Not on file  Social History Narrative   Patient is very active, walks 3 miles a day, former chief financial officer, 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 3 children one in [place], 2 in [place]   Social Drivers of Corporate Investment Banker Strain: Not on file  Food Insecurity: Not on file  Transportation Needs: Not on file  Physical Activity: Not on file  Stress: Not on file  Social Connections: Not on file     Family History: The patient's family history includes Cancer in his brother, brother, brother, brother, and brother.  ROS:   Please see the history of present illness.    All other systems reviewed and are negative.  EKGs/Labs/Other Studies Reviewed:    The following studies were reviewed today: I discussed findings with the patient at length.   Recent Labs: 07/10/2022: TSH 2.226 06/11/2023: ALT 8; BUN 20; Creatinine 1.30; Hemoglobin 14.4; Platelet Count 175; Potassium  4.2; Sodium 141  Recent Lipid Panel    Component Value Date/Time   CHOL 152 08/30/2014 1028   TRIG 90 08/30/2014 1028   HDL 39 (L) 08/30/2014 1028   CHOLHDL 3.9 08/30/2014 1028   VLDL 18 08/30/2014 1028   LDLCALC 95 08/30/2014 1028    Physical Exam:    VS:  BP (!) 146/74   Pulse 64   Ht 5' 9 (1.753 m)   Wt 194 lb 9.6 oz (88.3 kg)   SpO2 99%   BMI 28.74 kg/m     Wt Readings from Last 3 Encounters:  06/30/23 194 lb 9.6 oz (88.3 kg)  06/11/23 194 lb (88 kg)  04/30/23 194 lb 6.4 oz (88.2 kg)     GEN: Patient is in no acute distress HEENT: Normal NECK: No JVD; No carotid bruits LYMPHATICS: No lymphadenopathy CARDIAC: Hear sounds regular, 2/6 systolic murmur at the apex. RESPIRATORY:  Clear to auscultation without rales, wheezing or rhonchi  ABDOMEN: Soft, non-tender, non-distended MUSCULOSKELETAL:  No edema; No deformity  SKIN: Warm and dry NEUROLOGIC:  Alert and oriented x 3 PSYCHIATRIC:  Normal affect   Signed, Jennifer JONELLE Crape, MD  06/30/2023 10:26 AM    [facility]

## 2023-07-01 LAB — COMPREHENSIVE METABOLIC PANEL
ALT: 12 [IU]/L (ref 0–44)
AST: 10 [IU]/L (ref 0–40)
Albumin: 4.4 g/dL (ref 3.7–4.7)
Alkaline Phosphatase: 58 [IU]/L (ref 44–121)
BUN/Creatinine Ratio: 19 (ref 10–24)
BUN: 23 mg/dL (ref 8–27)
Bilirubin Total: 0.5 mg/dL (ref 0.0–1.2)
CO2: 24 mmol/L (ref 20–29)
Calcium: 9.6 mg/dL (ref 8.6–10.2)
Chloride: 102 mmol/L (ref 96–106)
Creatinine, Ser: 1.19 mg/dL (ref 0.76–1.27)
Globulin, Total: 1.8 g/dL (ref 1.5–4.5)
Glucose: 94 mg/dL (ref 70–99)
Potassium: 4.5 mmol/L (ref 3.5–5.2)
Sodium: 142 mmol/L (ref 134–144)
Total Protein: 6.2 g/dL (ref 6.0–8.5)
eGFR: 60 mL/min/{1.73_m2} (ref >59)

## 2023-07-08 ENCOUNTER — Ambulatory Visit: Payer: PPO | Attending: Cardiology

## 2023-07-08 DIAGNOSIS — I2510 Atherosclerotic heart disease of native coronary artery without angina pectoris: Secondary | ICD-10-CM

## 2023-07-08 DIAGNOSIS — I428 Other cardiomyopathies: Secondary | ICD-10-CM

## 2023-07-08 MED ORDER — TECHNETIUM TC 99M TETROFOSMIN IV KIT
30.3000 | PACK | Freq: Once | INTRAVENOUS | Status: AC | PRN
Start: 2023-07-08 — End: 2023-07-08
  Administered 2023-07-08: 30.3 via INTRAVENOUS

## 2023-07-08 MED ORDER — TECHNETIUM TC 99M TETROFOSMIN IV KIT
10.2000 | PACK | Freq: Once | INTRAVENOUS | Status: AC | PRN
  Administered 2023-07-08: 10.2 via INTRAVENOUS

## 2023-07-09 DIAGNOSIS — G629 Polyneuropathy, unspecified: Secondary | ICD-10-CM | POA: Diagnosis not present

## 2023-07-09 DIAGNOSIS — N182 Chronic kidney disease, stage 2 (mild): Secondary | ICD-10-CM | POA: Diagnosis not present

## 2023-07-09 DIAGNOSIS — N183 Chronic kidney disease, stage 3 unspecified: Secondary | ICD-10-CM | POA: Diagnosis not present

## 2023-07-13 LAB — MYOCARDIAL PERFUSION IMAGING
Angina Index: 0
Duke Treadmill Score: 4
Estimated workload: 5
Exercise duration (min): 4 min
Exercise duration (sec): 15 s
LV dias vol: 94 mL (ref 62–150)
LV sys vol: 45 mL
MPHR: 136 {beats}/min
Nuc Stress EF: 52 %
Peak HR: 123 {beats}/min
Percent HR: 90 %
Rest HR: 73 {beats}/min
Rest Nuclear Isotope Dose: 10.2 mCi
SDS: 5
SRS: 9
SSS: 11
ST Depression (mm): 0 mm
Stress Nuclear Isotope Dose: 30.3 mCi
TID: 0.82

## 2023-07-14 ENCOUNTER — Other Ambulatory Visit: Payer: Self-pay

## 2023-07-14 DIAGNOSIS — I428 Other cardiomyopathies: Secondary | ICD-10-CM

## 2023-07-14 NOTE — Progress Notes (Unsigned)
 Marland Kitchen

## 2023-08-16 DIAGNOSIS — E782 Mixed hyperlipidemia: Secondary | ICD-10-CM | POA: Diagnosis not present

## 2023-08-17 LAB — LIPID PANEL
Chol/HDL Ratio: 3 {ratio} (ref 0.0–5.0)
Cholesterol, Total: 138 mg/dL (ref 100–199)
HDL: 46 mg/dL (ref >39)
LDL Chol Calc (NIH): 72 mg/dL (ref 0–99)
Triglycerides: 110 mg/dL (ref 0–149)
VLDL Cholesterol Cal: 20 mg/dL (ref 5–40)

## 2023-08-17 LAB — COMPREHENSIVE METABOLIC PANEL
ALT: 16 [IU]/L (ref 0–44)
AST: 11 [IU]/L (ref 0–40)
Albumin: 4.6 g/dL (ref 3.7–4.7)
Alkaline Phosphatase: 54 [IU]/L (ref 44–121)
BUN/Creatinine Ratio: 18 (ref 10–24)
BUN: 21 mg/dL (ref 8–27)
Bilirubin Total: 0.6 mg/dL (ref 0.0–1.2)
CO2: 24 mmol/L (ref 20–29)
Calcium: 9.4 mg/dL (ref 8.6–10.2)
Chloride: 101 mmol/L (ref 96–106)
Creatinine, Ser: 1.18 mg/dL (ref 0.76–1.27)
Globulin, Total: 2.2 g/dL (ref 1.5–4.5)
Glucose: 104 mg/dL — ABNORMAL HIGH (ref 70–99)
Potassium: 4.4 mmol/L (ref 3.5–5.2)
Sodium: 141 mmol/L (ref 134–144)
Total Protein: 6.8 g/dL (ref 6.0–8.5)
eGFR: 61 mL/min/{1.73_m2} (ref >59)

## 2023-08-27 ENCOUNTER — Encounter: Payer: Self-pay | Admitting: Cardiology

## 2023-08-27 ENCOUNTER — Ambulatory Visit: Payer: PPO | Attending: Cardiology | Admitting: Cardiology

## 2023-08-27 VITALS — BP 134/64 | HR 62 | Ht 69.6 in | Wt 192.4 lb

## 2023-08-27 DIAGNOSIS — I2510 Atherosclerotic heart disease of native coronary artery without angina pectoris: Secondary | ICD-10-CM | POA: Diagnosis not present

## 2023-08-27 DIAGNOSIS — I428 Other cardiomyopathies: Secondary | ICD-10-CM

## 2023-08-27 DIAGNOSIS — I10 Essential (primary) hypertension: Secondary | ICD-10-CM | POA: Diagnosis not present

## 2023-08-27 DIAGNOSIS — Z8679 Personal history of other diseases of the circulatory system: Secondary | ICD-10-CM | POA: Diagnosis not present

## 2023-08-27 DIAGNOSIS — E782 Mixed hyperlipidemia: Secondary | ICD-10-CM

## 2023-08-27 NOTE — Patient Instructions (Signed)

## 2023-08-27 NOTE — Progress Notes (Signed)
 Cardiology Office Note:    Date:  08/27/2023   ID:  Sean Hodges, DOB Nov 08, 1940, MRN [id number]  PCP:  Annamaria Helling, DO  Cardiologist:  Garwin Brothers, MD   Referring MD: Annamaria Helling, DO    ASSESSMENT:    1. Coronary artery calcification   2. Primary hypertension   3. Nonischemic cardiomyopathy (HCC)   4. History of cardiomyopathy   5. Mixed hyperlipidemia    PLAN:    In order of problems listed above:  Coronary artery calcification: Secondary prevention stressed with the patient.  Importance of compliance with diet medication stressed and he vocalized understanding.  He was advised to walk at least half an hour a day on a daily basis and he told me that he will try this.  He has orthopedic back issues. Cardiomyopathy: Mildly depressed ejection fraction.  Medical management.  Patient has multiple comorbidities including lymphoma.  This is per his request also.  Last stress test ejection fraction was normal.  Ejection fraction on echo was close to normal.  Mildly depressed Essential hypertension: Blood pressure is stable and diet was emphasized. Mixed dyslipidemia: On lipid-lowering medications.  Lipids followed by primary care team reviewed from [facility] sheet and discussed with the patient at length. Patient will be seen in follow-up appointment in 6 months or earlier if the patient has any concerns.    Medication Adjustments/Labs and Tests Ordered: Current medicines are reviewed at length with the patient today.  Concerns regarding medicines are outlined above.  No orders of the defined types were placed in this encounter.  No orders of the defined types were placed in this encounter.    No chief complaint on file.    History of Present Illness:    Sean Hodges is a 83 y.o. male.  Patient has past medical history of coronary artery calcification with nonischemic cardiomyopathy, essential hypertension, mixed dyslipidemia and lymphoma.  He denies  any problems at this time and takes care of activities of daily living.  No chest pain orthopnea or PND.  He is an active gentleman.  At the time of my evaluation, the patient is alert awake oriented and in no distress.  Past Medical History:  Diagnosis Date   Arthritis    knee osteoarthritis   Benign prostatic hyperplasia 07/03/2016   Cancer (HCC)    nhl   Chronic pain of left knee 11/15/2014   Coronary artery calcification 04/30/2023   DDD (degenerative disc disease), lumbar 07/03/2016   Disease of thyroid gland 07/03/2016   Overview:  Left Thyroidectomy (original site of lymphoma)   Dyspnea on exertion 08/30/2014   Follicular lymphoma (HCC) 07/29/2011   Diagnosed May 2000.  Stage IV--thyroid involvement.   GERD (gastroesophageal reflux disease) 07/03/2016   History of cardiomyopathy 08/07/2022   Hypercholesterolemia    Hyperlipidemia    Hypertension    Left knee pain 11/15/2014   Loose body of left knee 11/20/2014   Lumbar stenosis 07/03/2016   Lumbar stenosis with neurogenic claudication 09/21/2017   Lymphoma (HCC)    Neuropathy due to chemotherapeutic drug (HCC) 09/17/2013   Nonischemic cardiomyopathy (HCC) 03/07/2015   Port-A-Cath in place 05/01/2020   Postoperative urinary retention 10/05/2017   Precordial chest pain 08/30/2014   S/P left knee arthroscopy 11/26/2014   Spondylosis of lumbar joint 07/03/2016    Past Surgical History:  Procedure Laterality Date   arthroscopies Bilateral    Left eye surgery  2013   THYROIDECTOMY, PARTIAL Left    vocal  cord     polyps removed    Current Medications: Current Meds  Medication Sig   Acetaminophen (TYLENOL PO) Take 100 mg by mouth daily.   aspirin EC 81 MG tablet Take 81 mg by mouth daily.   calcium & magnesium carbonates (MYLANTA) 311-232 MG per tablet Take 1 tablet by mouth 2 (two) times daily.   Cholecalciferol (VITAMIN D3 PO) Take 500 Units by mouth 2 (two) times daily.   COENZYME Q-10 PO Take 100 mg by mouth  daily.   enalapril (VASOTEC) 10 MG tablet Take 1 tablet by mouth daily.   famotidine (PEPCID) 40 MG tablet Take 40 mg by mouth daily.   fish oil-omega-3 fatty acids 1000 MG capsule Take 1 g by mouth daily.   fluorouracil (EFUDEX) 5 % cream Apply 1 Application topically 2 (two) times daily.   glucosamine-chondroitin 500-400 MG tablet Take 1 tablet by mouth 2 (two) times daily.    lidocaine (LIDODERM) 5 % Place 1 patch onto the skin as needed for pain.   rosuvastatin (CRESTOR) 20 MG tablet Take 1 tablet (20 mg total) by mouth daily.   saw palmetto 500 MG capsule Take 500 mg by mouth 2 (two) times daily.   vitamin B-12 (CYANOCOBALAMIN) 1000 MCG tablet Take 1,000 mcg by mouth daily.    Current Facility-Administered Medications for the 08/27/23 encounter (Office Visit) with Debbra Digiulio, Aundra Dubin, MD  Medication   triamcinolone acetonide (KENALOG-40) injection 20 mg     Allergies:   Lipitor [atorvastatin]   Social History   Socioeconomic History   Marital status: Married    Spouse name: Not on file   Number of children: 3   Years of education: Not on file   Highest education level: Not on file  Occupational History   Occupation: retired-engineer  Tobacco Use   Smoking status: Former    Current packs/day: 0.00    Types: Cigarettes    Quit date: 01/20/1974    Years since quitting: 49.6   Smokeless tobacco: Former    Quit date: 06/22/1973  Vaping Use   Vaping status: Never Used  Substance and Sexual Activity   Alcohol use: Never   Drug use: Never   Sexual activity: Not on file  Other Topics Concern   Not on file  Social History Narrative   Patient is very active, walks 3 miles a day, former Chief Financial Officer, 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 3 children [place] in [place], 2 in [place]   Social Drivers of Corporate investment banker Strain: Not on file  Food Insecurity: Not on file  Transportation Needs: Not on file  Physical Activity:  Not on file  Stress: Not on file  Social Connections: Not on file     Family History: The patient's family history includes Cancer in his brother, brother, brother, brother, and brother.  ROS:   Please see the history of present illness.    All other systems reviewed and are negative.  EKGs/Labs/Other Studies Reviewed:    The following studies were reviewed today: I discussed my findings with the patient at length   Recent Labs: 05/31/2023: TSH 2.30 06/11/2023: Hemoglobin 14.4; Platelet Count 175 08/16/2023: ALT 16; BUN 21; Creatinine, Ser 1.18; Potassium 4.4; Sodium 141  Recent Lipid Panel    Component Value Date/Time   CHOL 138 08/16/2023 0833   TRIG 110 08/16/2023 0833   HDL 46 08/16/2023 0833   CHOLHDL 3.0 08/16/2023 2956  CHOLHDL 3.9 08/30/2014 1028   VLDL 18 08/30/2014 1028   LDLCALC 72 08/16/2023 0833    Physical Exam:    VS:  BP 134/64   Pulse 62   Ht 5' 9.6" (1.768 m)   Wt 192 lb 6.4 oz (87.3 kg)   SpO2 98%   BMI 27.92 kg/m     Wt Readings from Last 3 Encounters:  08/27/23 192 lb 6.4 oz (87.3 kg)  07/08/23 194 lb (88 kg)  06/30/23 194 lb 9.6 oz (88.3 kg)     GEN: Patient is in no acute distress HEENT: Normal NECK: No JVD; No carotid bruits LYMPHATICS: No lymphadenopathy CARDIAC: Hear sounds regular, 2/6 systolic murmur at the apex. RESPIRATORY:  Clear to auscultation without rales, wheezing or rhonchi  ABDOMEN: Soft, non-tender, non-distended MUSCULOSKELETAL:  No edema; No deformity  SKIN: Warm and dry NEUROLOGIC:  Alert and oriented x 3 PSYCHIATRIC:  Normal affect   Signed, Garwin Brothers, MD  08/27/2023 9:00 AM    [facility]

## 2023-08-30 DIAGNOSIS — G629 Polyneuropathy, unspecified: Secondary | ICD-10-CM | POA: Diagnosis not present

## 2023-08-30 DIAGNOSIS — Z6828 Body mass index (BMI) 28.0-28.9, adult: Secondary | ICD-10-CM | POA: Diagnosis not present

## 2023-09-02 ENCOUNTER — Other Ambulatory Visit (HOSPITAL_COMMUNITY): Payer: Self-pay

## 2023-10-19 DIAGNOSIS — M461 Sacroiliitis, not elsewhere classified: Secondary | ICD-10-CM | POA: Diagnosis not present

## 2023-11-05 DIAGNOSIS — D225 Melanocytic nevi of trunk: Secondary | ICD-10-CM | POA: Diagnosis not present

## 2023-11-05 DIAGNOSIS — L820 Inflamed seborrheic keratosis: Secondary | ICD-10-CM | POA: Diagnosis not present

## 2023-11-05 DIAGNOSIS — L814 Other melanin hyperpigmentation: Secondary | ICD-10-CM | POA: Diagnosis not present

## 2023-11-05 DIAGNOSIS — L570 Actinic keratosis: Secondary | ICD-10-CM | POA: Diagnosis not present

## 2023-11-05 DIAGNOSIS — L578 Other skin changes due to chronic exposure to nonionizing radiation: Secondary | ICD-10-CM | POA: Diagnosis not present

## 2023-11-05 DIAGNOSIS — C44622 Squamous cell carcinoma of skin of right upper limb, including shoulder: Secondary | ICD-10-CM | POA: Diagnosis not present

## 2023-11-12 DIAGNOSIS — M533 Sacrococcygeal disorders, not elsewhere classified: Secondary | ICD-10-CM | POA: Diagnosis not present

## 2023-11-12 DIAGNOSIS — G8929 Other chronic pain: Secondary | ICD-10-CM | POA: Diagnosis not present

## 2023-11-17 DIAGNOSIS — C44622 Squamous cell carcinoma of skin of right upper limb, including shoulder: Secondary | ICD-10-CM | POA: Diagnosis not present

## 2023-11-18 DIAGNOSIS — N183 Chronic kidney disease, stage 3 unspecified: Secondary | ICD-10-CM | POA: Diagnosis not present

## 2023-11-18 DIAGNOSIS — R0781 Pleurodynia: Secondary | ICD-10-CM | POA: Diagnosis not present

## 2023-11-18 DIAGNOSIS — G629 Polyneuropathy, unspecified: Secondary | ICD-10-CM | POA: Diagnosis not present

## 2023-11-18 DIAGNOSIS — Z6827 Body mass index (BMI) 27.0-27.9, adult: Secondary | ICD-10-CM | POA: Diagnosis not present

## 2023-11-18 DIAGNOSIS — Z8572 Personal history of non-Hodgkin lymphomas: Secondary | ICD-10-CM | POA: Diagnosis not present

## 2023-11-25 DIAGNOSIS — R0781 Pleurodynia: Secondary | ICD-10-CM | POA: Diagnosis not present

## 2023-11-25 DIAGNOSIS — Z8572 Personal history of non-Hodgkin lymphomas: Secondary | ICD-10-CM | POA: Diagnosis not present

## 2023-11-26 DIAGNOSIS — C8590 Non-Hodgkin lymphoma, unspecified, unspecified site: Secondary | ICD-10-CM | POA: Diagnosis not present

## 2023-12-08 DIAGNOSIS — H26102 Unspecified traumatic cataract, left eye: Secondary | ICD-10-CM | POA: Diagnosis not present

## 2023-12-08 DIAGNOSIS — Z961 Presence of intraocular lens: Secondary | ICD-10-CM | POA: Diagnosis not present

## 2023-12-09 ENCOUNTER — Ambulatory Visit: Payer: Self-pay | Admitting: Cardiology

## 2023-12-09 ENCOUNTER — Ambulatory Visit: Attending: Cardiology

## 2023-12-09 DIAGNOSIS — I428 Other cardiomyopathies: Secondary | ICD-10-CM | POA: Diagnosis not present

## 2023-12-09 LAB — ECHOCARDIOGRAM COMPLETE
Area-P 1/2: 3.2 cm2
P 1/2 time: 708 ms

## 2023-12-09 MED ORDER — PERFLUTREN LIPID MICROSPHERE
1.0000 mL | INTRAVENOUS | Status: AC | PRN
  Administered 2023-12-09: 10 mL via INTRAVENOUS

## 2023-12-09 NOTE — Progress Notes (Signed)
 [facility]  [address] [place],  [place]  [address] [phone]  Clinic Day:  12/10/2023  Referring physician: Russell Court, DO   HISTORY OF PRESENT ILLNESS:  The patient is an 83 y.o. male with recurrent low grade follicular lymphoma.  There is also history of diffuse large B cell lymphoma.  He last completed 6 cycles of Revlimid /Rituxan  in October 2023.  His PET afterwards showed a complete response to therapy.  He comes in today for routine follow-up.  Since his last visit, the patient has been doing well.  He denies having any B symptoms or new lymphadenopathy which concerns him for possible disease recurrence.    With respect to his lymphoma history, his diffuse large B-cell lymphoma was initially diagnosed in May 2000 per biopsy of a mass in his left thyroid .  As CT scans showed evidence of disease above and below his diaphragm, he was given 6 cycles of CHOP chemotherapy, followed by weekly Rituxan  for 8 treatments.  His disease did well up until August 2008 when CT and PET scans showed evidence of disease recurrence.  At that time, a repeat biopsy showed follicular lymphoma, which led to him undergoing 6 cycles of Rituxan -CVP.  The plan was to continue maintenance Rituxan  for 2 years, but the patient only received 1 cycle of maintenance Rituxan  before discontinuing it in early 2009.  The patient has had biopsies and resections of a fleshy skin nodule behind his right ear, whose pathology revealed follicular lymphoma, but with focal areas within it suggesting potential transformation into a more aggressive subtype.  However, this area never flared up to where intervention has been necessary.  He began having problems with his right eyelid, for which a biopsy of the area confirmed the presence of recurrent low-grade follicular lymphoma.  This biopsy and systemic disease spread on a PET scan led to him being placed on Revlimid /Rituxan , which was his first  systemic therapy since 2009.  He completed 6 cycles of this regimen in October 2023, with a PET scan afterwards showing a complete response.     PHYSICAL EXAM:  Blood pressure 135/72, pulse (!) 57, temperature 97.9 F (36.6 C), temperature source Oral, resp. rate 16, height 5' 9.6 (1.768 m), weight 187 lb 11.2 oz (85.1 kg), SpO2 99%. Wt Readings from Last 3 Encounters:  12/10/23 187 lb 11.2 oz (85.1 kg)  08/27/23 192 lb 6.4 oz (87.3 kg)  07/08/23 194 lb (88 kg)   Body mass index is 27.24 kg/m. Performance status (ECOG): 1 - Symptomatic but completely ambulatory Physical Exam Constitutional:      Appearance: Normal appearance. He is not ill-appearing.  HENT:     Mouth/Throat:     Mouth: Mucous membranes are moist.     Pharynx: Oropharynx is clear. No oropharyngeal exudate or posterior oropharyngeal erythema.   Cardiovascular:     Rate and Rhythm: Normal rate and regular rhythm.     Heart sounds: No murmur heard.    No friction rub. No gallop.  Pulmonary:     Effort: Pulmonary effort is normal. No respiratory distress.     Breath sounds: Normal breath sounds. No wheezing, rhonchi or rales.  Abdominal:     General: Bowel sounds are normal. There is no distension.     Palpations: Abdomen is soft. There is no mass.     Tenderness: There is no abdominal tenderness.   Musculoskeletal:        General: No swelling.  Right lower leg: No edema.     Left lower leg: No edema.  Lymphadenopathy:     Cervical: No cervical adenopathy.     Upper Body:     Right upper body: No supraclavicular or axillary adenopathy.     Left upper body: No supraclavicular or axillary adenopathy.     Lower Body: No right inguinal adenopathy. No left inguinal adenopathy.   Skin:    General: Skin is warm.     Coloration: Skin is not jaundiced.     Findings: No lesion or rash.   Neurological:     General: No focal deficit present.     Mental Status: He is alert and oriented to person, place, and time.  Mental status is at baseline.   Psychiatric:        Mood and Affect: Mood normal.        Behavior: Behavior normal.        Thought Content: Thought content normal.    LABS:      Latest Ref Rng & Units 12/10/2023    9:42 AM 06/11/2023    9:25 AM 02/09/2023   12:00 AM  CBC  WBC 4.0 - 10.5 K/uL 7.3  7.3  7.4      Hemoglobin 13.0 - 17.0 g/dL 13.0  86.5  78.4      Hematocrit 39.0 - 52.0 % 40.5  41.6  43      Platelets 150 - 400 K/uL 144  175  171         This result is from an external source.      Latest Ref Rng & Units 12/10/2023    9:42 AM 08/16/2023    8:33 AM 06/30/2023   10:54 AM  CMP  Glucose 70 - 99 mg/dL 92  696  94   BUN 8 - 23 mg/dL 28  21  23    Creatinine 0.61 - 1.24 mg/dL 2.95  2.84  1.32   Sodium 135 - 145 mmol/L 139  141  142   Potassium 3.5 - 5.1 mmol/L 4.6  4.4  4.5   Chloride 98 - 111 mmol/L 103  101  102   CO2 22 - 32 mmol/L 27  24  24    Calcium  8.9 - 10.3 mg/dL 9.9  9.4  9.6   Total Protein 6.5 - 8.1 g/dL 6.4  6.8  6.2   Total Bilirubin 0.0 - 1.2 mg/dL 0.4  0.6  0.5   Alkaline Phos 38 - 126 U/L 56  54  58   AST 15 - 41 U/L 10  11  10    ALT 0 - 44 U/L 12  16  12     Lab Results  Component Value Date   LDH 143 12/10/2023   LDH 122 06/11/2023   LDH 128 02/09/2023    ASSESSMENT & PLAN:  Assessment/Plan:  An 83 y.o. male with biopsy-proven recurrence of low-grade follicular lymphoma.  He also has a history of diffuse large B-cell lymphoma.  Based upon his physical exam and labs today, the patient remains disease free.  Clinically, he is doing very well.  As that is the case, I do feel comfortable seeing him back in another 6 months.  The patient understands all the plans discussed today and is in agreement with them.     Maynor Mwangi Felicia Horde, MD

## 2023-12-10 ENCOUNTER — Other Ambulatory Visit: Payer: Self-pay | Admitting: Oncology

## 2023-12-10 ENCOUNTER — Telehealth: Payer: Self-pay | Admitting: Oncology

## 2023-12-10 ENCOUNTER — Inpatient Hospital Stay: Payer: PPO | Admitting: Oncology

## 2023-12-10 ENCOUNTER — Inpatient Hospital Stay: Payer: PPO | Attending: Oncology

## 2023-12-10 VITALS — BP 135/72 | HR 57 | Temp 97.9°F | Resp 16 | Ht 69.6 in | Wt 187.7 lb

## 2023-12-10 DIAGNOSIS — C8289 Other types of follicular lymphoma, extranodal and solid organ sites: Secondary | ICD-10-CM

## 2023-12-10 DIAGNOSIS — C8298 Follicular lymphoma, unspecified, lymph nodes of multiple sites: Secondary | ICD-10-CM

## 2023-12-10 DIAGNOSIS — Z95828 Presence of other vascular implants and grafts: Secondary | ICD-10-CM

## 2023-12-10 DIAGNOSIS — C8290 Follicular lymphoma, unspecified, unspecified site: Secondary | ICD-10-CM | POA: Diagnosis not present

## 2023-12-10 LAB — CMP (CANCER CENTER ONLY)
ALT: 12 U/L (ref 0–44)
AST: 10 U/L — ABNORMAL LOW (ref 15–41)
Albumin: 4.5 g/dL (ref 3.5–5.0)
Alkaline Phosphatase: 56 U/L (ref 38–126)
Anion gap: 9 (ref 5–15)
BUN: 28 mg/dL — ABNORMAL HIGH (ref 8–23)
CO2: 27 mmol/L (ref 22–32)
Calcium: 9.9 mg/dL (ref 8.9–10.3)
Chloride: 103 mmol/L (ref 98–111)
Creatinine: 1.27 mg/dL — ABNORMAL HIGH (ref 0.61–1.24)
GFR, Estimated: 56 mL/min — ABNORMAL LOW (ref >60)
Glucose, Bld: 92 mg/dL (ref 70–99)
Potassium: 4.6 mmol/L (ref 3.5–5.1)
Sodium: 139 mmol/L (ref 135–145)
Total Bilirubin: 0.4 mg/dL (ref 0.0–1.2)
Total Protein: 6.4 g/dL — ABNORMAL LOW (ref 6.5–8.1)

## 2023-12-10 LAB — CBC WITH DIFFERENTIAL (CANCER CENTER ONLY)
Abs Immature Granulocytes: 0.01 10*3/uL (ref 0.00–0.07)
Basophils Absolute: 0.1 10*3/uL (ref 0.0–0.1)
Basophils Relative: 1 %
Eosinophils Absolute: 0.2 10*3/uL (ref 0.0–0.5)
Eosinophils Relative: 3 %
HCT: 40.5 % (ref 39.0–52.0)
Hemoglobin: 13.5 g/dL (ref 13.0–17.0)
Immature Granulocytes: 0 %
Lymphocytes Relative: 32 %
Lymphs Abs: 2.3 10*3/uL (ref 0.7–4.0)
MCH: 29.3 pg (ref 26.0–34.0)
MCHC: 33.3 g/dL (ref 30.0–36.0)
MCV: 87.9 fL (ref 80.0–100.0)
Monocytes Absolute: 0.9 10*3/uL (ref 0.1–1.0)
Monocytes Relative: 12 %
Neutro Abs: 3.8 10*3/uL (ref 1.7–7.7)
Neutrophils Relative %: 52 %
Platelet Count: 144 10*3/uL — ABNORMAL LOW (ref 150–400)
RBC: 4.61 MIL/uL (ref 4.22–5.81)
RDW: 13.4 % (ref 11.5–15.5)
WBC Count: 7.3 10*3/uL (ref 4.0–10.5)
nRBC: 0 % (ref 0.0–0.2)

## 2023-12-10 LAB — LACTATE DEHYDROGENASE: LDH: 143 U/L (ref 98–192)

## 2023-12-10 MED ORDER — HEPARIN SOD (PORK) LOCK FLUSH 100 UNIT/ML IV SOLN
500.0000 [IU] | Freq: Once | INTRAVENOUS | Status: AC | PRN
  Administered 2023-12-10: 500 [IU]

## 2023-12-10 MED ORDER — SODIUM CHLORIDE 0.9% FLUSH
10.0000 mL | INTRAVENOUS | Status: DC | PRN
  Administered 2023-12-10: 10 mL

## 2023-12-10 NOTE — Telephone Encounter (Signed)
 Patient has been scheduled for follow-up visit per 12/10/23 LOS.  Pt given an appt calendar with date and time.

## 2023-12-15 NOTE — Telephone Encounter (Signed)
Results reviewed with pt as per Dr. Revankar's note.  Pt verbalized understanding and had no additional questions. Routed to PCP.  

## 2024-01-03 DIAGNOSIS — Q181 Preauricular sinus and cyst: Secondary | ICD-10-CM | POA: Diagnosis not present

## 2024-02-28 DIAGNOSIS — H0100A Unspecified blepharitis right eye, upper and lower eyelids: Secondary | ICD-10-CM | POA: Diagnosis not present

## 2024-02-28 DIAGNOSIS — H5319 Other subjective visual disturbances: Secondary | ICD-10-CM | POA: Diagnosis not present

## 2024-02-28 DIAGNOSIS — H1820 Unspecified corneal edema: Secondary | ICD-10-CM | POA: Diagnosis not present

## 2024-02-29 DIAGNOSIS — E042 Nontoxic multinodular goiter: Secondary | ICD-10-CM | POA: Diagnosis not present

## 2024-02-29 DIAGNOSIS — H6192 Disorder of left external ear, unspecified: Secondary | ICD-10-CM | POA: Diagnosis not present

## 2024-02-29 DIAGNOSIS — R350 Frequency of micturition: Secondary | ICD-10-CM | POA: Diagnosis not present

## 2024-02-29 DIAGNOSIS — Z79899 Other long term (current) drug therapy: Secondary | ICD-10-CM | POA: Diagnosis not present

## 2024-02-29 DIAGNOSIS — Z0000 Encounter for general adult medical examination without abnormal findings: Secondary | ICD-10-CM | POA: Diagnosis not present

## 2024-02-29 DIAGNOSIS — Z2821 Immunization not carried out because of patient refusal: Secondary | ICD-10-CM | POA: Diagnosis not present

## 2024-02-29 DIAGNOSIS — Z6827 Body mass index (BMI) 27.0-27.9, adult: Secondary | ICD-10-CM | POA: Diagnosis not present

## 2024-02-29 DIAGNOSIS — Z86711 Personal history of pulmonary embolism: Secondary | ICD-10-CM | POA: Diagnosis not present

## 2024-02-29 DIAGNOSIS — Z1339 Encounter for screening examination for other mental health and behavioral disorders: Secondary | ICD-10-CM | POA: Diagnosis not present

## 2024-02-29 DIAGNOSIS — Z1331 Encounter for screening for depression: Secondary | ICD-10-CM | POA: Diagnosis not present

## 2024-02-29 DIAGNOSIS — I428 Other cardiomyopathies: Secondary | ICD-10-CM | POA: Diagnosis not present

## 2024-02-29 LAB — LAB REPORT - SCANNED: EGFR: 58

## 2024-03-27 DIAGNOSIS — Z8572 Personal history of non-Hodgkin lymphomas: Secondary | ICD-10-CM | POA: Diagnosis not present

## 2024-03-27 DIAGNOSIS — Q181 Preauricular sinus and cyst: Secondary | ICD-10-CM | POA: Diagnosis not present

## 2024-04-03 DIAGNOSIS — Q181 Preauricular sinus and cyst: Secondary | ICD-10-CM | POA: Diagnosis not present

## 2024-05-02 DIAGNOSIS — M25562 Pain in left knee: Secondary | ICD-10-CM | POA: Diagnosis not present

## 2024-05-02 DIAGNOSIS — M545 Low back pain, unspecified: Secondary | ICD-10-CM | POA: Diagnosis not present

## 2024-05-08 DIAGNOSIS — L821 Other seborrheic keratosis: Secondary | ICD-10-CM | POA: Diagnosis not present

## 2024-05-08 DIAGNOSIS — C44622 Squamous cell carcinoma of skin of right upper limb, including shoulder: Secondary | ICD-10-CM | POA: Diagnosis not present

## 2024-05-08 DIAGNOSIS — L814 Other melanin hyperpigmentation: Secondary | ICD-10-CM | POA: Diagnosis not present

## 2024-05-08 DIAGNOSIS — L578 Other skin changes due to chronic exposure to nonionizing radiation: Secondary | ICD-10-CM | POA: Diagnosis not present

## 2024-05-08 DIAGNOSIS — L570 Actinic keratosis: Secondary | ICD-10-CM | POA: Diagnosis not present

## 2024-05-15 DIAGNOSIS — R202 Paresthesia of skin: Secondary | ICD-10-CM | POA: Diagnosis not present

## 2024-05-15 DIAGNOSIS — R079 Chest pain, unspecified: Secondary | ICD-10-CM | POA: Diagnosis not present

## 2024-05-15 DIAGNOSIS — M79601 Pain in right arm: Secondary | ICD-10-CM | POA: Diagnosis not present

## 2024-05-15 DIAGNOSIS — M4312 Spondylolisthesis, cervical region: Secondary | ICD-10-CM | POA: Diagnosis not present

## 2024-05-15 DIAGNOSIS — R200 Anesthesia of skin: Secondary | ICD-10-CM | POA: Diagnosis not present

## 2024-05-15 DIAGNOSIS — M47812 Spondylosis without myelopathy or radiculopathy, cervical region: Secondary | ICD-10-CM | POA: Diagnosis not present

## 2024-05-15 LAB — LAB REPORT - SCANNED: EGFR: 66

## 2024-05-17 DIAGNOSIS — M25511 Pain in right shoulder: Secondary | ICD-10-CM | POA: Diagnosis not present

## 2024-05-17 DIAGNOSIS — G629 Polyneuropathy, unspecified: Secondary | ICD-10-CM | POA: Diagnosis not present

## 2024-05-24 ENCOUNTER — Encounter: Payer: Self-pay | Admitting: Cardiology

## 2024-05-24 ENCOUNTER — Ambulatory Visit: Attending: Cardiology | Admitting: Cardiology

## 2024-05-24 VITALS — BP 130/78 | HR 69 | Ht 69.0 in | Wt 192.8 lb

## 2024-05-24 DIAGNOSIS — I2510 Atherosclerotic heart disease of native coronary artery without angina pectoris: Secondary | ICD-10-CM

## 2024-05-24 DIAGNOSIS — I10 Essential (primary) hypertension: Secondary | ICD-10-CM | POA: Diagnosis not present

## 2024-05-24 DIAGNOSIS — I428 Other cardiomyopathies: Secondary | ICD-10-CM

## 2024-05-24 DIAGNOSIS — E782 Mixed hyperlipidemia: Secondary | ICD-10-CM | POA: Diagnosis not present

## 2024-05-24 NOTE — Patient Instructions (Signed)

## 2024-05-24 NOTE — Progress Notes (Signed)
 Cardiology Office Note:    Date:  05/24/2024   ID:  [name], DOB 30-Nov-1940, MRN [id number]  PCP:  Conley Dene BROCKS., MD  Cardiologist:  Jennifer JONELLE Crape, MD   Referring MD: Conley Dene BROCKS., MD    ASSESSMENT:    1. Mixed hyperlipidemia   2. Coronary artery calcification   3. Primary hypertension   4. Nonischemic cardiomyopathy (HCC)    PLAN:    In order of problems listed above:  Coronary artery disease: Secondary prevention stressed with the patient.  Importance of compliance with diet medication stressed and patient verbalized standing.  He is doing excellent exercise with half an hour of walking every day asymptomatic and I told him to continue this. Essential hypertension: Blood pressure stable and diet was emphasized.  Lifestyle modification and dietary issues were discussed. Mixed dyslipidemia: On lipid-lowering medications I reviewed records from primary care and discussed lipids.  They are at goal.   Patient went to hospital and had a complete evaluation done for right upper extremity pain and I reviewed and discussed with the patient at length. Patient will be seen in follow-up appointment in 6 months or earlier if the patient has any concerns.   Medication Adjustments/Labs and Tests Ordered: Current medicines are reviewed at length with the patient today.  Concerns regarding medicines are outlined above.  Orders Placed This Encounter  Procedures   EKG 12-Lead   No orders of the defined types were placed in this encounter.    No chief complaint on file.    History of Present Illness:    Sean Hodges is a 83 y.o. male.  Patient has past medical history of coronary artery calcification, essential hypertension, mixed dyslipidemia and history of cardiomyopathy.  Last ejection fraction was near normal.  He denies any problems at this time and takes care of activities of daily living.  He had issues with right upper extremity pain and was  found to have the possibility of a pinched nerve and is seeing orthopedic doctor tomorrow.  He walks half an hour a day without any problems.  Thank  Past Medical History:  Diagnosis Date   Arthritis    knee osteoarthritis   Benign prostatic hyperplasia 07/03/2016   Cancer (HCC)    nhl   Chronic pain of left knee 11/15/2014   Coronary artery calcification 04/30/2023   DDD (degenerative disc disease), lumbar 07/03/2016   Disease of thyroid  gland 07/03/2016   Overview:  Left Thyroidectomy (original site of lymphoma)   Dyspnea on exertion 08/30/2014   Follicular lymphoma (HCC) 07/29/2011   Diagnosed May 2000.  Stage IV--thyroid  involvement.   GERD (gastroesophageal reflux disease) 07/03/2016   History of cardiomyopathy 08/07/2022   Hypercholesterolemia    Hyperlipidemia    Hypertension    Left knee pain 11/15/2014   Loose body of left knee 11/20/2014   Lumbar stenosis 07/03/2016   Lumbar stenosis with neurogenic claudication 09/21/2017   Lymphoma (HCC)    Neuropathy due to chemotherapeutic drug 09/17/2013   Nonischemic cardiomyopathy (HCC) 03/07/2015   Port-A-Cath in place 05/01/2020   Postoperative urinary retention 10/05/2017   Precordial chest pain 08/30/2014   S/P left knee arthroscopy 11/26/2014   Spondylosis of lumbar joint 07/03/2016    Past Surgical History:  Procedure Laterality Date   arthroscopies Bilateral    Left eye surgery  2013   THYROIDECTOMY, PARTIAL Left    vocal cord     polyps removed    Current Medications: Current Meds  Medication Sig   Acetaminophen  (TYLENOL  PO) Take 100 mg by mouth daily.   calcium  & magnesium carbonates (MYLANTA) 311-232 MG per tablet Take 1 tablet by mouth 2 (two) times daily.   Cholecalciferol (VITAMIN D3 PO) Take 500 Units by mouth 2 (two) times daily.   COENZYME Q-10 PO Take 100 mg by mouth daily.   enalapril (VASOTEC) 10 MG tablet Take 1 tablet by mouth daily.   famotidine (PEPCID) 40 MG tablet Take 40 mg by mouth  daily.   fish oil-omega-3 fatty acids 1000 MG capsule Take 1 g by mouth daily.   fluorouracil (EFUDEX) 5 % cream Apply 1 Application topically 2 (two) times daily.   glucosamine-chondroitin 500-400 MG tablet Take 1 tablet by mouth 2 (two) times daily.    lidocaine  (LIDODERM ) 5 % Place 1 patch onto the skin as needed for pain.   rosuvastatin  (CRESTOR ) 20 MG tablet Take 1 tablet (20 mg total) by mouth daily.   saw palmetto 500 MG capsule Take 500 mg by mouth 2 (two) times daily.   vitamin B-12 (CYANOCOBALAMIN ) 1000 MCG tablet Take 1,000 mcg by mouth daily.    Current Facility-Administered Medications for the 05/24/24 encounter (Office Visit) with Adryel Wortmann, Jennifer SAUNDERS, MD  Medication   triamcinolone  acetonide (KENALOG -40) injection 20 mg     Allergies:   Lipitor [atorvastatin]   Social History   Socioeconomic History   Marital status: Married    Spouse name: Not on file   Number of children: 3   Years of education: Not on file   Highest education level: Not on file  Occupational History   Occupation: retired-engineer  Tobacco Use   Smoking status: Former    Current packs/day: 0.00    Types: Cigarettes    Quit date: 01/20/1974    Years since quitting: 50.3   Smokeless tobacco: Former    Quit date: 06/22/1973  Vaping Use   Vaping status: Never Used  Substance and Sexual Activity   Alcohol use: Never   Drug use: Never   Sexual activity: Not on file  Other Topics Concern   Not on file  Social History Narrative   Patient is very active, walks 3 miles a day, former chief financial officer, 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 3 children [place] in [place], 2 in [place]   Social Drivers of Corporate Investment Banker Strain: Not on file  Food Insecurity: Not on file  Transportation Needs: Not on file  Physical Activity: Not on file  Stress: Not on file  Social Connections: Not on file     Family History: The patient's family history  includes Cancer in his brother, brother, brother, brother, and brother.  ROS:   Please see the history of present illness.    All other systems reviewed and are negative.  EKGs/Labs/Other Studies Reviewed:    The following studies were reviewed today: .SABRAEKG Interpretation Date/Time:  Wednesday May 24 2024 08:56:22 EST Ventricular Rate:  69 PR Interval:  202 QRS Duration:  100 QT Interval:  380 QTC Calculation: 407 R Axis:   -3  Text Interpretation: Normal sinus rhythm Normal ECG When compared with ECG of 30-Apr-2023 10:05, Premature supraventricular complexes are no longer Present Confirmed by Edwyna Jennifer [phone]) on 05/24/2024 9:05:12 AM \   Recent Labs: 05/31/2023: TSH 2.30 12/10/2023: ALT 12; BUN 28; Creatinine 1.27; Hemoglobin 13.5; Platelet Count 144; Potassium 4.6; Sodium 139  Recent Lipid Panel    Component  Value Date/Time   CHOL 138 08/16/2023 0833   TRIG 110 08/16/2023 0833   HDL 46 08/16/2023 0833   CHOLHDL 3.0 08/16/2023 0833   CHOLHDL 3.9 08/30/2014 1028   VLDL 18 08/30/2014 1028   LDLCALC 72 08/16/2023 0833    Physical Exam:    VS:  BP 130/78   Pulse 69   Ht 5' 9 (1.753 m)   Wt 192 lb 12.8 oz (87.5 kg)   SpO2 97%   BMI 28.47 kg/m     Wt Readings from Last 3 Encounters:  05/24/24 192 lb 12.8 oz (87.5 kg)  12/10/23 187 lb 11.2 oz (85.1 kg)  08/27/23 192 lb 6.4 oz (87.3 kg)     GEN: Patient is in no acute distress HEENT: Normal NECK: No JVD; No carotid bruits LYMPHATICS: No lymphadenopathy CARDIAC: Hear sounds regular, 2/6 systolic murmur at the apex. RESPIRATORY:  Clear to auscultation without rales, wheezing or rhonchi  ABDOMEN: Soft, non-tender, non-distended MUSCULOSKELETAL:  No edema; No deformity  SKIN: Warm and dry NEUROLOGIC:  Alert and oriented x 3 PSYCHIATRIC:  Normal affect   Signed, Jennifer JONELLE Crape, MD  05/24/2024 9:06 AM    [facility]

## 2024-05-25 DIAGNOSIS — M5136 Other intervertebral disc degeneration, lumbar region with discogenic back pain only: Secondary | ICD-10-CM | POA: Diagnosis not present

## 2024-05-25 DIAGNOSIS — M47816 Spondylosis without myelopathy or radiculopathy, lumbar region: Secondary | ICD-10-CM | POA: Diagnosis not present

## 2024-06-01 DIAGNOSIS — R1032 Left lower quadrant pain: Secondary | ICD-10-CM | POA: Diagnosis not present

## 2024-06-08 ENCOUNTER — Telehealth: Payer: Self-pay

## 2024-06-08 NOTE — Transitions of Care (Post Inpatient/ED Visit) (Signed)
 06/08/2024  Name: Sean Hodges MRN: [id number] DOB: December 09, 1940  Today's TOC FU Call Status: Today's TOC FU Call Status:: Successful TOC FU Call Completed TOC FU Call Complete Date: 06/08/24  Patient's Name and Date of Birth confirmed. Name, DOB  Transition Care Management Follow-up Telephone Call Date of Discharge: 06/07/24 Discharge Facility: Other ([facility]) Name of Other ([facility]) Discharge Facility: [facility] Type of Discharge: Inpatient Admission Primary Inpatient Discharge Diagnosis:: Small bowel obstruction How have you been since you were released from the hospital?: Better Any questions or concerns?: No  Items Reviewed: Did you receive and understand the discharge instructions provided?: Yes Medications obtained,verified, and reconciled?: Yes (Medications Reviewed) Any new allergies since your discharge?: No Dietary orders reviewed?: Yes Type of Diet Ordered:: low residue Do you have support at home?: Yes People in Home [RPT]: spouse  Medications Reviewed Today: Medications Reviewed Today     Reviewed by Rumalda Alan PENNER, RN (Registered Nurse) on 06/08/24 at 1341  Med List Status: <None>   Medication Order Taking? Sig Documenting Provider Last Dose Status Informant  Acetaminophen  (TYLENOL  PO) [id number] Yes Take 100 mg by mouth daily.  Patient taking differently: Take 500 mg by mouth every 6 (six) hours as needed.   [provider]  Active   azithromycin (ZITHROMAX) 500 MG tablet [id number] Yes Take 500 mg by mouth daily. [provider]  Active            Med Note (ROSE, Jeniffer Culliver U   Thu Jun 08, 2024  1:40 PM) Take for 4 days.   calcium  & magnesium carbonates (MYLANTA) 311-232 MG per tablet [id number] Yes Take 1 tablet by mouth 2 (two) times daily. [provider]  Active   Cholecalciferol (VITAMIN D3 PO) [id number] Yes Take 500 Units by mouth 2 (two) times daily. [provider]  Active   COENZYME Q-10 PO [id number]  Yes Take 100 mg by mouth daily. [provider]  Active   dextromethorphan-guaiFENesin [facility] DM) 30-600 MG 12hr tablet [id number] Yes Take 1 tablet by mouth 2 (two) times daily. [provider]  Active   enalapril (VASOTEC) 10 MG tablet [id number] Yes Take 1 tablet by mouth daily. [provider]  Active   famotidine (PEPCID) 40 MG tablet [id number] Yes Take 40 mg by mouth daily. [provider]  Active   fish oil-omega-3 fatty acids 1000 MG capsule [id number]  Take 1 g by mouth daily.  Patient not taking: Reported on 06/08/2024   [provider]  Active   fluorouracil (EFUDEX) 5 % cream [id number] Yes Apply 1 Application topically 2 (two) times daily. [provider]  Active   glucosamine-chondroitin 500-400 MG tablet [id number]  Take 1 tablet by mouth 2 (two) times daily.   Patient not taking: Reported on 06/08/2024   [provider]  Active            Med Note CLEATUS JON HERO   Fri Mar 11, 2018  9:02 AM)    lidocaine  (LIDODERM ) 5 % [id number] Yes Place 1 patch onto the skin as needed for pain. [provider]  Active   rosuvastatin  (CRESTOR ) 20 MG tablet [id number] Yes Take 1 tablet (20 mg total) by mouth daily. Revankar, Jennifer SAUNDERS, MD  Active   saw palmetto 500 MG capsule [id number] Yes Take 500 mg by mouth 2 (two) times daily. [provider]  Active   sodium chloride  flush (NS) 0.9 % injection 10 mL [id number]   Ezzard Valaria LABOR, MD  Active   sodium chloride  flush (NS) 0.9 % injection 10 mL [id number]   Ezzard, Dequincy A, MD  Active   triamcinolone  acetonide (KENALOG -40) injection 20 mg [id number]   Burt Fus, DPM  Active   vitamin B-12 (CYANOCOBALAMIN ) 1000 MCG tablet [id number] Yes Take 1,000 mcg by mouth daily.  [provider]  Active            Med Note CLEATUS JON HERO   Fri Mar 11, 2018  9:02 AM)    Med List Note Carmella Asberry FALCON, [facility] 10/06/21 1147): Revlimid  filled through [facility] by  [employer] ([facility] location)            Home Care and Equipment/Supplies: Were Home Health Services Ordered?: Yes Name of Home Health Agency:: unknown Has Agency set up a time to come to your home?: No Any new equipment or medical supplies ordered?: Yes Name of Medical supply agency?:  (placed call to [facility] health and confirmed orders with Rosaline.  Patient will be seen tomorrow. Patinet informed.) Were you able to get the equipment/medical supplies?: Yes Do you have any questions related to the use of the equipment/supplies?: No  Functional Questionnaire: Do you need assistance with bathing/showering or dressing?: Yes Do you need assistance with meal preparation?: Yes Do you need assistance with eating?: No Do you have difficulty maintaining continence: Yes Do you need assistance with getting out of bed/getting out of a chair/moving?: Yes (family) Do you have difficulty managing or taking your medications?: Yes (wife)  Follow up appointments reviewed: PCP Follow-up appointment confirmed?: No (Wife will call and make an appointment) MD Provider Line Number:[phone] Given: No Specialist Hospital Follow-up appointment confirmed?: No Reason Specialist Follow-Up Not Confirmed: Patient has Specialist Provider Number and will Call for Appointment Do you need transportation to your follow-up appointment?: No Do you understand care options if your condition(s) worsen?: Yes-patient verbalized understanding  SDOH Interventions Today    Flowsheet Row Most Recent Value  SDOH Interventions   Food Insecurity Interventions Intervention Not Indicated  Housing Interventions Intervention Not Indicated  Transportation Interventions Intervention Not Indicated  Utilities Interventions Intervention Not Indicated   Spoke with patient and wife. Patient doing well.  Patient has all medications and is taking as prescribed.  Confirmed oxygen  and nebulizer was delivered and patient  understands how to use new equipment. Patient already has a walker and declined need for a new walker. Placed call to [facility] health and confirmed orders and start of care for tomorrow.  Reviewed importance of hospital follow up appointment with PCP and surgeon. Encouraged wife to call and schedule.  Reviewed importance of daily weight and BP's. Encouraged patient and or wife to record readings and take to MD office.   Reviewed importance of taking all antibiotics as prescribed.  Patient has 3 days left. Reviewed when to call MD for complications.  Reviewed and offered 30 day TOC and patient declined.   Alan Ee, RN, BSN, CEN [employer]- Transition of Care Team.  [facility] [phone]

## 2024-06-09 ENCOUNTER — Inpatient Hospital Stay

## 2024-06-09 ENCOUNTER — Inpatient Hospital Stay: Admitting: Oncology

## 2024-06-24 NOTE — H&P (Signed)
 [facility] Medicine History & Physical   Subjective  CC: Transfer for acute PE and GIB  History of Present Illness:  Sean Hodges is a 84 y.o. male w PMH of CAD, follicular lymphoma, hypertension, mixed dyslipidemia, history of cardiomyopathy (low normal EF 50-55% 11/2023) who was transferred from [facility] for acute PE complicated by GI bleed. Pt was admitted in early December with SBO that required ex lap. He was discharged but went back to hospital 12/23 with shortness of breath, found to have Flu A+ and had new PE (CTA- right upper lobar pulmonary artery extending in its branches, no right heart strain or aortic dissection seen).  LE dopplers negative. He was started on heparin  infusion and transition to Eliquis, but developed large volume bloody stool after starting Eliquis. It appears that during his first hospitalization, a brief episode of melena had occurred after the surgery, and self resolved before PE developed.  He received 2 u pRBCs and Hb around 8 prior to the transfer. Now he was started on Lovenox but it's felt he needs endoscopic source control of bleeding before long-term anticoagulation, hence transferred here for GI eval.   Here, pt reports breathing has improved over the past several days. He still feels dizzy when standing up or using the commode. VSS, on room air. Pt states he still has daily black stool, which was never cleared during the current hospital course. He was not on any oral iron.  His last dose of Lovenox was 1/2 evening before the transfer. Hb 8.1 on arrival.  Hospital course per OSH Discharge Summary: 84 year old male with recent small-bowel resection with primary anastomosis on 06/02/2024 was treated for volume overload with IV Lasix and discharged 6 days ago on home oxygen  2 L after ruling out PE with CTA of the chest presents to ED from home after complaining of shortness  of breath with changes in position and unable to catch his breath hence activated EMS. Patient was home with sats in the 70s on 2L oxygen  at home, was placed on non-rebreather-10 L, was given DuoNebs and steroids en route to the hospital. He has a remote history smoking-15 pack-year history of smoking in his teenage years. He does not normally wear oxygen . He also reports to being having black stools since his surgery which seems to be fading away. Patient denies chest pain, worsening cough, hemoptysis, nausea, diarrhea or recent febrile episodes. On arrival to ED patient was afebrile with temp of 98.1 F, tachycardic with heart rate in 105, tachypneic with respirations 29, blood pressure 133/91, Sp02 93% on high-flow oxygen . Labs-CBC unremarkable with normal white count of 9.2, hemoglobin of 12, platelets of 305. ABG with normal pH of 7.48/38/52/28 4 L via nasal cannula. Patient apparently responded well with breathing treatment and steroids. CMP was unremarkable with normal renal and LFTs, proBP was normal at 189, negative procalcitonin and troponin-. Lactic acid was normal at 1.8 repeated 1.2. Stool occults were negative. Chest x-ray was negative for acute findings, CTA of the chest suggestive of right sided pulmonary artery embolism. Patient was started on IV heparin  and referred to hospitalist service for further evaluation and management. 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 At baseline patient has been independent of IALs prior to his recent surgery, lives with his spouse.  Brief hospital course: Patient was admitted to acute medical floor with a diagnosis  of acute PE requiring anticoagulation in the setting of recent small-bowel resection with new diagnosis of influenza a requiring oxygen . Discussed with surgery who recommended anticoagulation from surgical standpoint. Patient was started on Tamiflu, Solu-Medrol  for underlying hypoxic respiratory failure secondary to influenza A. Patient's H&H was monitored closely, seemed to have tolerated heparin  hence on hospital day 3. Was transition to oral Eliquis. Within 48 hours of starting Eliquis patient was noted to have multiple bloody bowel movements suggestive of lower GI bleed hence Eliquis was discontinued, serial H&Hs seems to bring his H&H to 8.1. Patient completed 5 days of Tamiflu hence will was discontinued along with prednisone. Patient was continued on Protonix which was increased to twice daily. Currently patient has been weaned off oxygen  awaiting hemodynamically stability with stable hemoglobin. Patient received another unit of PRC within the past 24 hours. Tricky situation that patient not tolerating oral anticoagulation with Eliquis, could consider starting on Coumadin in 1-2 days if H&H stable.   Review of Systems  Respiratory:  Positive for shortness of breath.   Cardiovascular:  Positive for leg swelling.  Gastrointestinal:  Positive for blood in stool and melena.  Neurological:  Positive for dizziness and weakness.  All other systems reviewed and are negative.   Past Medical History:  Diagnosis Date   Arthritis    Benign prostatic hyperplasia    Cancer (*)    Chronic pain of left knee    Coronary artery calcification    DDD (degenerative disc disease), lumbar    Disease of thyroid  gland    Dyspnea on exertion    Follicular lymphoma (*)    GERD (gastroesophageal reflux disease)    History of cardiomyopathy    Hypercholesterolemia    Hyperlipidemia    Hypertension    Left knee pain    Loose body of left knee    Lumbar stenosis    Lumbar stenosis with  neurogenic claudication    Lymphoma (*)    Neuropathy due to chemotherapeutic drug (*)    Nonischemic cardiomyopathy (*)    Port-A-Cath in place    Postoperative urinary retention    Precordial chest pain    S/P left knee arthroscopy    Spondylosis of lumbar joint    Past Surgical History:  Procedure Laterality Date   Arthroscopies Bilateral    Eye surgery Left 2013   Thyroidectomy, partial Left    Vocal cord polyps     Family History[1]  Social History[2] Prior to Admission medications  Medication Sig Start Date End Date Taking? Authorizing Provider  aspirin (ECOTRIN LOW DOSE) EC tablet Take one tablet (81 mg dose) by mouth daily. Patient not taking: Reported on 05/25/2024    Historical Provider, MD  Calcium  & Magnesium Carbonates (MYLANTA PO) Take 1 tablet by mouth 2 (two) times daily.    Historical Provider, MD  CALCIUM  CARB-MAGNESIUM CARB PO Take 1 tablet by mouth 2 (two) times daily.    Historical Provider, MD  cholecalciferol (VITAMIN D -1000 MAX ST) 1,000 units (25 mcg) tablet Take one half tablet (500 Units dose) by mouth.    Historical Provider, MD  COENZYME Q-10 PO Take 100 mg by mouth daily.    Historical Provider, MD  enalapril maleate (VASOTEC) 10 mg tablet Take one tablet (10 mg  dose) by mouth daily.    Historical Provider, MD  famotidine (PEPCID) 40 mg tablet Take one tablet (40 mg dose) by mouth daily. 04/21/23   Historical Provider, MD  fish oil-omega-3 fatty acids 1000 MG capsule Take one capsule (1 g dose) by mouth daily.    Historical Provider, MD  fluorouracil (EFUDEX) 5 % cream Apply topically 2 (two) times daily.    Historical Provider, MD  glucosamine-chondroitin 500-400 mg per tablet Take one tablet by mouth 2 (two) times daily.    Historical Provider, MD  lidocaine  (LIDODERM ) 5% Place one patch onto the skin daily. Remove & Discard patch within 12 hours or as directed by MD    Historical Provider, MD  mupirocin (BACTROBAN) 2 % ointment  SMARTSIG:Topical 3-4 Times Daily 03/27/24   Historical Provider, MD  omega-3 acid ethyl esters (LOVAZA) 1 g capsule Take one capsule (1,000 mg dose) by mouth daily.    Historical Provider, MD  pravastatin (PRAVACHOL) 80 MG tablet Take one tablet (80 mg dose) by mouth every evening. Patient not taking: Reported on 05/25/2024    Historical Provider, MD  rosuvastatin  calcium  (CRESTOR ) 20 mg tablet Take one tablet (20 mg dose) by mouth daily. 06/30/23   Historical Provider, MD  Saw Palmetto 500 MG CAPS Take 500 mg by mouth 2 (two) times daily.    Historical Provider, MD  vitamin B-12 (CYANOCOBALAMIN ) 1000 mcg tablet Take one tablet (1,000 mcg dose) by mouth daily.    Historical Provider, MD   Allergies[3]  Objective  Temp:  [97.6 F (36.4 C)]  Heart Rate:  [83]  Resp:  [19]  BP: (134)/(68)  SpO2:  [92 %]     Physical Exam   Constitutional - resting comfortably; chronic ill appearance Eyes - pupils equal round and reactive to light and accomodation, extra ocular movements intact Nose - no gross deformity or drainage Mouth - no oral lesions noted Throat - no swelling or erythema   CV - (+)S1S2, no murmurs; no JVD or peripheral edema  Resp - CTA bilaterally, no wheezing or crackles; no clubbing or cyanosis  GI - (+)BS, soft, non-tender, non-distended.  Midline surgical scar with dressing clean MSK - ROM normal Skin - no rashes or wounds Neuro - alert, aware, oriented to person/place/time  Psych - normal mood and affect   Patient has skin breakdown/impairment on Admission?: no  Labs: Recent Labs    Units 06/24/24 0221  WBC thou/mcL 6.5  HGB gm/dL 8.3*  HCT % 72.5*  PLT thou/mcL 162   Recent Labs    Units 06/24/24 0221  NA mmol/L 143  K mmol/L 3.9  CL mmol/L 106  CO2 mmol/L 29  BUN mg/dL 18  CREATININE mg/dL 9.06  CALCIUM  mg/dL 8.2*  BILITOT mg/dL 0.4  AST U/L 16  ALT U/L 60*  ALKPHOS U/L 83  ALBUMIN gm/dL 3.0*   No results for input(s): TSH in the last 168 hours. No  results found for: T4 Recent Labs    Units 06/24/24 0221  INR  1.2  PTT second(s) 34   No results for input(s): TROPONIN, CK in the last 168 hours.  Invalid input(s): CK-MB No results for input(s): BNP in the last 168 hours.  Pertinent Radiological findings (summarize): Uploaded to PACS from OSH  Chest x-ray: No acute cardiopulmonary disease is seen  CTA chest: Filling defect in right upper lobar pulmonary artery extending in its branches suggestive pulmonary embolism, no right heart strain or aortic dissection seen. Bibasal atelectatic bands are  seen.  12/26-repeat chest x-ray-no acute cardiopulmonary findings noted  Cardiac tracings:     Assessment  Sean Hodges is a 84 y.o. male with the following problems:  Principal Problem:   Acute pulmonary embolism, unspecified pulmonary embolism type, unspecified whether acute cor pulmonale present (*) Active Problems:   Hypercholesterolemia   Port-A-Cath in place   GERD (gastroesophageal reflux disease)   Hypertension   Neuropathy due to chemotherapeutic drug (*)   Plan: # GIB in the setting of recent ex lap and anticoagulation Pt reports ongoing melena that has not been resolved over the past week; hb appears holding Discharge summary mentioned slight Hb drop the night of 1/1-1/2 concerning for slow GIB -telemetry -2 large-bore IVs -Type and cross; consented; transfusion goal Hb >7, plt >50. pRBC 2u prepared.   -NPO pending GI eval -PPI -GI consulted -Trend H/H -Hold home BP meds -Avoid Lovenox; SCD for DVT ppx  # Acute blood loss anemia - Check iron panel, ferritin, transferrin, retic count, and fecal occult blood - Transfusion and iv iron as needed  # Acute PE # History of PE Likely provoked in postop setting OSH CTA: Filling defect in right upper lobar pulmonary artery extending in its branches suggestive pulmonary embolism, no right heart strain or aortic dissection seen. Bibasal atelectatic  bands are seen. DVT ruled out. Likely hemodynamically insignificant PE with current normal vitals -Hold anticoagulation for now given ongoing melena -Check fecal occult blood.  If no bleeding, will favor heparin  over Lovenox or DOAC  # Influenza A -resolved Per patient, symptom onset was early December after surgery  -Completed Tamiflu and steroid  # HLD - Continue statin  # HTN - Hold BP meds due to soft BP  # GERD -PPI  # Neuropathy - Vitamin B12 - Continue PT OT  Abnormal blood counts:Acute Blood Loss Anemia, present on admission Plan: Repeat CBC in AM and See plan above  Fluid and electrolyte disorder:Euvolemic present on admission Plan:Repeat BMP in AM  Nutritional status: Body mass index is 27.47 kg/m. overweight Plan:PCP follow up and See plan aboveCardiac diet  Debility: Impaired mobility due to medical condition  Plan: Encourage OOB/mobility and Consult PT   Fluids IVF: none    Prophylaxis DVT: not indicated due to bleeding risk  General Code status: Do not resuscitate Surrogate decision maker is spouse PCP: No primary care provider on file. None Specialists:GI   Disposition Admit to Med-tele    Discussed plan of care with consulting physician and nurse I have discussed the diagnoses and care plan with the patient.    Jennine Pott, MD 06/24/2024 4:30 AM       [1] Family History Problem Relation Name Age of Onset   Cancer Brother      Brother      Brother      Brother      Brother     Heart failure Father      Mother    [2] Social History Socioeconomic History   Marital status: Married  Tobacco Use   Smoking status: Former    Current packs/day: 0.00    Average packs/day: 0.5 packs/day for 15.0 years (7.5 ttl pk-yrs)    Types: Cigarettes    Start date: 60    Quit date: 72    Years since quitting: 51.0    Passive exposure: Past   Smokeless tobacco: Former  Building Services Engineer status: Never Used  Substance and Sexual Activity    Alcohol use: Never  Drug use: Never  [3] Allergies Allergen Reactions   Atorvastatin Other    Causes Muscle Spasm Muscle aches, myalgias   Gabapentin  Other, Nausea And Vomiting and Nausea Only    Felt really sick

## 2024-06-24 NOTE — Interval H&P Note (Signed)
 H&P reviewed, patient examined and there is NO CHANGE to the patient status.  Lonni Bowers, MD 06/24/2024 / 9:30 AM

## 2024-06-24 NOTE — H&P (View-Only) (Signed)
 ------------------------------------------------------------------------------- Attestation signed by Lonni Bowers, MD at 06/24/24 [phone] I have reviewed the patient's medical record, history, notes, pertinent labs and imaging results.  I have personally interviewed and examined the patient.  I have discussed and reviewed the plan with the Advanced Practitioner and agree with the plan that has been outlined in the note.  Recommendations as noted below.  84 y.o. male with a history of follicular lymphoma, cardiomyopathy, coronary disease and hypertension is seen today for evaluation of GI bleeding.  Patient was admitted outside hospital for small bowel obstruction.  He underwent surgery in December.  Afterwards he presented with shortness of breath and found to have PE and influenza.  Patient was started on Eliquis and developed melena.  Patient describes his black tarry stools.  He has not seen red blood.  He has had some epigastric discomfort.  Patient states for the past year for back pain he has been taking a combination of ibuprofen and acetaminophen .  He takes famotidine at home.  He is not on a PPI.  Patient states that 10 years ago he had endoscopy and colonoscopy.  This was for screening.  Per his recollection, the studies were normal.  Patient has occasional dysphagia to solid foods.  He states that if the food is dry it can get hung up at the base of his neck.  He never has to regurgitate.  His weight and appetite been stable.  He has no family history of GI disease or malignancy.  Does not smoke cigarettes or drink alcohol.  On admission he was found have a hemoglobin 8.3.  He was transfused 2 units packed red blood cells at the outside hospital.  Patient's baseline hemoglobin is 14 g/dL.  GEN: Alert and oriented, no acute distress CV: Regular rate and rhythm RESP: Clear to auscultation bilaterally ABD: Soft with mild epigastric tenderness palpation.  No rebound or guarding  Patient likely  with upper GI bleed from peptic ulcer disease given his regular NSAID use.  Bleeding exacerbated by anticoagulation for his PE.  Mild dysphagia to solids which may be related to an esophageal ring.  We will further evaluate with an upper endoscopy.  IV PPI.  Monitor H&H and transfuse as needed. Hold anticoagulation.   60 minutes spent in patient care for this encounter including any face to face time, reviewing records and coordinating care (excluding separately reportable procedure time and any overlapping time).   Electronically signed:   Lonni Bowers, MD 06/24/2024 / 9:23 AM  -------------------------------------------------------------------------------  [facility]  Consultation [facility]   Consults  CC: melena  Referring provider: Jennine Pott, MD  Consulting MD: Medford Bowers  Date of Consultation: 06/25/2023  Reason for Consultation: melena    Assessment  Active Hospital Problems   *Acute pulmonary embolism, unspecified pulmonary embolism type, unspecified whether acute cor pulmonale present (*)    Hypertension    Hypercholesterolemia    GERD (gastroesophageal reflux disease)    Port-A-Cath in place    Neuropathy due to chemotherapeutic drug (*)  84 year old male history to include CAD,  follicular lymphoma, hypertension, mixed dyslipidemia, history of cardiomyopathy (low normal EF 50-55% 11/2023) who was transferred from OSH for acute PE complicated by GI bleed.  Patient was hospitalized in December for SBO requiring ex lap.  He was discharged home but presented back with shortness of breath.  Found to be flu a positive and new PE.  He completed a course of Tamiflu.  He was started on  anticoagulation and subsequently developed melena.  Hemoglobin down to 8.1 g/dL.  Anticoagulation has been held and he has been transferred to [facility] for further evaluation.  GI consultation requested for melena.  The patient's signs and symptoms  are certainly concerning for upper GI bleeding.  He does have risk factors for PUD including chronic NSAID use.  Recommend EGD today to evaluate the cause of bleeding.  Keep NPO.  PPI twice daily.  Monitor for signs of bleeding.  Recommendations  Plan: - EGD today - N.p.o. - PPI twice daily - Monitor labs, H&H.  Transfuse if needed. - Monitor for signs of bleeding - Supportive care - Continue recommendations of primary team  GI will follow  I personally saw and examined the patient and spent 15 minutes face-to-face for this encounter. (excluding separately reportable procedure time and any overlapping time).  Total time spent in care of this patient 55 minutes.   History  History of Present Illness:  Sean Hodges is a 84 y.o. male who is significant history to include CAD,  follicular lymphoma, hypertension, mixed dyslipidemia, history of cardiomyopathy (low normal EF 50-55% 11/2023) who was transferred from [facility] for acute PE complicated by GI bleed. Pt was admitted in early December with SBO that required ex lap. He was discharged but went back to hospital 12/23 with shortness of breath, found to have Flu A+ and had new PE (CTA- right upper lobar pulmonary artery extending in its branches, no right heart strain or aortic dissection seen).    The patient was started on heparin  infusion and transitioned to Eliquis but unfortunately developed bloody bowel movement.  It was reported he experienced melena.  Eliquis was then stopped and he was placed on Lovenox.  He did receive PRBC transfusion at OSH.  Patient denies any nausea or vomiting.  Denies abdominal pain.  He does report regular daily NSAID use ongoing for the past year for management of back pain.  Patient was transferred here to [facility] for GI coverage  Arrival to [facility] WC 6.5, hemoglobin 8.3, platelets 162, MCV 91 INR 1.2, PT 15.1 ALT 60, AST 16, total bilirubin 0.4, alk phos 83 Creatinine 0.93, BUN 18 Ferritin 258,  iron saturation 21% (post PRBC transfusion)     Past Medical History:  Diagnosis Date   Arthritis    Benign prostatic hyperplasia    Cancer (*)    Chronic pain of left knee    Coronary artery calcification    DDD (degenerative disc disease), lumbar    Disease of thyroid  gland    Dyspnea on exertion    Follicular lymphoma (*)    GERD (gastroesophageal reflux disease)    History of cardiomyopathy    Hypercholesterolemia    Hyperlipidemia    Hypertension    Left knee pain    Loose body of left knee    Lumbar stenosis    Lumbar stenosis with neurogenic claudication    Lymphoma (*)    Neuropathy due to chemotherapeutic drug (*)    Nonischemic cardiomyopathy (*)    Port-A-Cath in place    Postoperative urinary retention    Precordial chest pain    S/P left knee arthroscopy    Spondylosis of lumbar joint    Past Surgical History:  Procedure Laterality Date   Arthroscopies Bilateral    Eye surgery Left 2013   Thyroidectomy, partial Left    Vocal cord polyps      Allergies[1] Infusions:   NaCl     Medications:  calcium  carbonate  1 tablet Oral BID   cholecalciferol  1,000 Units Oral Daily   NaCl  10 mL Intracatheter Q12H SCH   pantoprazole  40 mg IntraVENous BID   Followed by   NOREEN ON 06/27/2024] pantoprazole sodium  40 mg Oral BID   rosuvastatin  calcium   20 mg Oral Daily   ferric Na complex  250 mg IntraVENous Q24H SCH   vitamin B-12  1,000 mcg Oral Daily   PRN medications: acetaminophen  Oral **OR** acetaminophen  Rectal,acetaminophen  Oral **OR** acetaminophen  Rectal,albuterol sulfate Nebulization,hydrALAZINE HCl IntraVENous,NaCl IntraVENous,NaCl Intracatheter **AND** NaCl Intracatheter,ondansetron  Oral **OR** ondansetron  IntraVENous,oxyCODONE  HCl Oral,oxyCODONE  HCl Oral,sennosides-docusate sodium Oral Prior to Admission medications  Medication Sig Start Date End Date Taking? Authorizing Provider  aspirin (ECOTRIN LOW DOSE) EC  tablet Take one tablet (81 mg dose) by mouth daily. Patient not taking: Reported on 05/25/2024    Historical Provider, MD  Calcium  & Magnesium Carbonates (MYLANTA PO) Take 1 tablet by mouth 2 (two) times daily.    Historical Provider, MD  CALCIUM  CARB-MAGNESIUM CARB PO Take 1 tablet by mouth 2 (two) times daily.    Historical Provider, MD  cholecalciferol (VITAMIN D -1000 MAX ST) 1,000 units (25 mcg) tablet Take one half tablet (500 Units dose) by mouth.    Historical Provider, MD  COENZYME Q-10 PO Take 100 mg by mouth daily.    Historical Provider, MD  enalapril maleate (VASOTEC) 10 mg tablet Take one tablet (10 mg dose) by mouth daily.    Historical Provider, MD  famotidine (PEPCID) 40 mg tablet Take one tablet (40 mg dose) by mouth daily. 04/21/23   Historical Provider, MD  fish oil-omega-3 fatty acids 1000 MG capsule Take one capsule (1 g dose) by mouth daily.    Historical Provider, MD  fluorouracil (EFUDEX) 5 % cream Apply topically 2 (two) times daily.    Historical Provider, MD  glucosamine-chondroitin 500-400 mg per tablet Take one tablet by mouth 2 (two) times daily.    Historical Provider, MD  lidocaine  (LIDODERM ) 5% Place one patch onto the skin daily. Remove & Discard patch within 12 hours or as directed by MD    Historical Provider, MD  mupirocin (BACTROBAN) 2 % ointment SMARTSIG:Topical 3-4 Times Daily 03/27/24   Historical Provider, MD  omega-3 acid ethyl esters (LOVAZA) 1 g capsule Take one capsule (1,000 mg dose) by mouth daily.    Historical Provider, MD  pravastatin (PRAVACHOL) 80 MG tablet Take one tablet (80 mg dose) by mouth every evening. Patient not taking: Reported on 05/25/2024    Historical Provider, MD  rosuvastatin  calcium  (CRESTOR ) 20 mg tablet Take one tablet (20 mg dose) by mouth daily. 06/30/23   Historical Provider, MD  Saw Palmetto 500 MG CAPS Take 500 mg by mouth 2 (two) times daily.    Historical Provider, MD  vitamin B-12 (CYANOCOBALAMIN ) 1000 mcg tablet Take one  tablet (1,000 mcg dose) by mouth daily.    Historical Provider, MD    Social History[2] Family History[3] Review of Systems  Constitutional:  Positive for fatigue. Negative for fever and unexpected weight change.  HENT:  Negative for trouble swallowing and voice change.   Eyes: Negative.   Respiratory:  Positive for shortness of breath. Negative for cough.   Cardiovascular:  Negative for chest pain and leg swelling.  Gastrointestinal:  Positive for blood in stool and diarrhea. Negative for abdominal pain, constipation, nausea and vomiting.  Genitourinary: Negative.   Musculoskeletal:  Negative for arthralgias and gait problem.  Skin:  Negative for  color change and rash.  Neurological:  Positive for weakness. Negative for headaches.  Psychiatric/Behavioral:  Negative for confusion. The patient is not nervous/anxious.     Physical Examination  Temp:  [97.6 F (36.4 C)-98.5 F (36.9 C)] 97.9 F (36.6 C) Heart Rate:  [83-87] 83 Resp:  [18-19] 18 BP: (122-150)/(64-75) 122/64 SpO2:  [90 %-93 %] 90 % Pain Score:   7 O2 Device: None (Room air) No data recorded   Intakes & Outputs (Last 24 hours) at 06/24/2024 0700 Last data filed at 06/24/2024 0525 24hr Volume @0700   Intake --  Output 500 mL  Net -500 mL    Physical Exam Constitutional:      General: He is not in acute distress. HENT:     Head: Normocephalic and atraumatic.     Mouth/Throat:     Mouth: Mucous membranes are moist.     Pharynx: Oropharynx is clear.  Eyes:     General: No scleral icterus.    Conjunctiva/sclera: Conjunctivae normal.  Cardiovascular:     Rate and Rhythm: Normal rate.  Musculoskeletal:     Right lower leg: No edema.     Left lower leg: No edema.  Pulmonary:     Effort: Pulmonary effort is normal. No respiratory distress.  Abdominal:     General: Bowel sounds are normal. There is no distension.     Tenderness: There is no abdominal tenderness. There is no guarding.  Skin:    General: Skin  is warm and dry.     Coloration: Skin is pale. Skin is not jaundiced.  Neurological:     Mental Status: He is alert and oriented to person, place, and time.  Psychiatric:        Mood and Affect: Mood normal.        Behavior: Behavior normal.    Results  Labs:  Recent Results (from the past 24 hours)  CBC And Differential   Collection Time: 06/24/24  2:21 AM  Result Value Ref Range   WBC 6.5 4.0 - 10.5 thou/mcL   RBC 2.99 (L) 4.63 - 6.08 million/mcL   HGB 8.3 (L) 13.7 - 17.5 gm/dL   HCT 72.5 (L) 59.8 - 48.9 %   MCV 91.6 79.0 - 92.2 fL   MCH 27.8 25.7 - 32.2 pg   MCHC 30.3 (L) 32.3 - 36.5 gm/dL   Plt Ct 837 849 - 599 thou/mcL   RDW SD 57.7 (H) 35.1 - 46.3 fL   MPV 9.8 9.4 - 12.4 fL   NRBC% 0.0 0.0 - 0.2 /100WBC   Absolute NRBC Count 0.00 0.00 - 0.01 thou/mcL   NEUTROPHIL % 57.7 %   LYMPHOCYTE % 21.9 %   MONOCYTE % 16.0 %   Eosinophil % 1.6 %   BASOPHIL % 0.5 %   IG% 2.3 %   ABSOLUTE NEUTROPHIL COUNT 3.73 1.50 - 7.50 thou/mcL   ABSOLUTE LYMPHOCYTE COUNT 1.41 1.00 - 4.50 thou/mcL   Absolute Monocyte Count 1.03 (H) 0.10 - 0.80 thou/mcL   Absolute Eosinophil Count 0.10 0.00 - 0.50 thou/mcL   Absolute Basophil Count 0.03 0.00 - 0.20 thou/mcL   Absolute Immature Granulocyte Count 0.15 (H) 0.00 - 0.03 thou/mcL   Comprehensive Metabolic Panel   Collection Time: 06/24/24  2:21 AM  Result Value Ref Range   Na 143 136 - 146 mmol/L   Potassium 3.9 3.7 - 5.4 mmol/L   Cl 106 97 - 108 mmol/L   CO2 29 20 - 32 mmol/L   AGAP 8  7 - 16 mmol/L   Glucose 113 (H) 65 - 99 mg/dL   BUN 18 8 - 27 mg/dL   Creatinine 9.06 9.23 - 1.27 mg/dL   Ca 8.2 (L) 8.6 - 89.7 mg/dL   ALK PHOS 83 25 - 839 U/L   T Bili 0.4 0.0 - 1.2 mg/dL   Total Protein 4.9 (L) 6.0 - 8.5 gm/dL   Alb 3.0 (L) 3.5 - 4.7 gm/dL   GLOBULIN 1.9 1.5 - 4.5 gm/dL   ALBUMIN/GLOBULIN RATIO 1.6 1.1 - 2.5   BUN/CREAT RATIO 19.4 11.0 - 26.0   ALT 60 (H) 0 - 55 U/L   AST 16 0 - 40 U/L   eGFR 80 >=60 mL/min/1.60m2  Type and Screen    Collection Time: 06/24/24  2:21 AM  Result Value Ref Range   ABO Rh Type O POS    Antibody Screen NEG    Specimen Expiration 06/27/2024 23:59   Protime-INR   Collection Time: 06/24/24  2:21 AM  Result Value Ref Range   PT 15.1 (H) 11.8 - 14.3 second(s)   INR 1.2 See Therapeutic ranges  PTT   Collection Time: 06/24/24  2:21 AM  Result Value Ref Range   PTT 34 22 - 35 second(s)  Previous Blood Bank History?   Collection Time: 06/24/24  2:21 AM  Result Value Ref Range   Prev Pt History? NO   Iron and Total Iron-Binding Capacity (TIBC)   Collection Time: 06/24/24  2:21 AM  Result Value Ref Range   Iron 37.3 (L) 40.0 - 155.0 mcg/dL   UIBC 856 (L) 849 - 624 mcg/dL   % Iron Sat 21 15 - 55 %   IBCT 180 (L) 250 - 450 mcg/dL  Ferritin   Collection Time: 06/24/24  2:21 AM  Result Value Ref Range   Ferritin 258.0 30.0 - 400.0 ng/mL  Transferrin   Collection Time: 06/24/24  2:21 AM  Result Value Ref Range   Transferrin 153 (L) 200 - 370 mg/dL  Reticulocyte Count Automated   Collection Time: 06/24/24  2:21 AM  Result Value Ref Range   Reticulocyte %  9.94 (H) 0.50 - 1.80 %   Immature Reticulocyte Fraction  34.3 (H) 2.3 - 15.9 %   Reticulocyte Hemoglobin  29.7 28.2 - 36.6 pg   Absolute Reticulocyte Count 0.2922 (H) 0.0164 - 0.0950 million/mcL  Prepare RBC, 2 Units   Collection Time: 06/24/24  2:21 AM  Result Value Ref Range   Unit ABO  O    Unit Rh POS    [facility] Product Code E0686    Unit # Barcode [id number]    Unit Status selected    Product Barcode [id number]    Prodt ABORH 5100    Prodt Exp [id number]    Unit ABO  O    Unit Rh POS    [facility] Product Code Z9663    Unit # Barcode [id number]    Unit Status selected    Product Barcode [id number]    Prodt ABORH 5100    Prodt Exp [id number]   ECG 12 lead   Collection Time: 06/24/24  3:01 AM  Result Value Ref Range   Acquisition Device MV360    Ventricular Rate 85 BPM   Atrial Rate 85 BPM   P-R Interval 194 ms   QRS  Duration 96 ms   Q-T Interval 364 ms   QTC Calculation(Bazett) 433 ms   Calculated P Axis 69 degrees   Calculated R Axis 47 degrees  Calculated T Axis 52 degrees   ECG Diagnosis      Normal sinus rhythm Normal ECG No previous ECGs available   Respiratory Panel Nasopharyngeal Nasopharyngeal   Collection Time: 06/24/24  3:43 AM   Specimen: Nasopharyngeal  Result Value Ref Range   SARS-COV-2 Not Detected Not Detected   Adenovirus Not Detected Not Detected   Coronavirus 229E Not Detected Not Detected   Coronavirus HKU1 Not Detected Not Detected   Coronavirus NL63 Not Detected Not Detected   Coronavirus OC43 Not Detected Not Detected   Human Metapneumovirus Not Detected Not Detected   Influenza A Not Detected Not Detected   Influenza B Not Detected Not Detected   Parainfluenza Virus 1 Not Detected Not Detected   Parainfluenza Virus 2 Not Detected Not Detected   Parainfluenza Virus 3 Not Detected Not Detected   Parainfluenza Virus 4 Not Detected Not Detected   Human Rhinovirus/Enterovirus Not Detected Not Detected   Respiratory Syncytial Virus Not Detected Not Detected   Mycoplasma pneumoniae Not Detected Not Detected   Chlamydophila pneumoniae Not Detected Not Detected   Bordetella pertussis Not Detected Not Detected   Bordetella parapertussis Not Detected Not Detected  ABO and RH Confirmation   Collection Time: 06/24/24  4:19 AM  Result Value Ref Range   ABO Rh Type O POS    Imaging: CT Outside Images Chest Result Date: 06/24/2024 Patient Name:  ZIV WELCHEL Date of Birth:  [birth year]  male Interpretation Procedure was imported into PACS for comparison purposes only.   CT Outside Images Abdomen Pelvis Result Date: 06/24/2024 Patient Name:  KAILO KOSIK Date of Birth:  12-03-1940  male Interpretation Procedure was imported into PACS for comparison purposes only.   XR Outside Images Chest Result Date: 06/24/2024 Patient Name:  LIZZIE COKLEY Date of Birth:   06-09-1941  male Interpretation Procedure was imported into PACS for comparison purposes only.   US  Outside Images Vascular Result Date: 06/24/2024 Patient Name:  CROY DRUMWRIGHT Date of Birth:  01/19/1941  male Interpretation Procedure was imported into PACS for comparison purposes only.    Electronically Signed: Frederic KANDICE Homans, PA-C 06/24/2024 8:26 AM       [1] Allergies Allergen Reactions   Atorvastatin Other    Causes Muscle Spasm Muscle aches, myalgias   Gabapentin  Other, Nausea And Vomiting and Nausea Only    Felt really sick  [2] Social History Socioeconomic History   Marital status: Married  Tobacco Use   Smoking status: Former    Current packs/day: 0.00    Average packs/day: 0.5 packs/day for 15.0 years (7.5 ttl pk-yrs)    Types: Cigarettes    Start date: 16    Quit date: 24    Years since quitting: 51.0    Passive exposure: Past   Smokeless tobacco: Former  Building Services Engineer status: Never Used  Substance and Sexual Activity   Alcohol use: Never   Drug use: Never  [3] Family History Problem Relation Name Age of Onset   Cancer Brother      Brother      Brother      Brother      Brother     Heart failure Father      Mother

## 2024-06-26 NOTE — Discharge Summary (Signed)
 [facility] [place] [facility] Inpatient Care Specialists Discharge Summary    Discharge Details  PATIENT NAME: Sean Hodges  MRN: [id number]  DOB: 1940-12-21 ADMIT Date: 06/24/2024  1:11 AM   D/C Date: 06/26/2024 Hospital LOS in days: 3     Discharge Physician: Lavanda CHRISTELLA Ned, MD Outpatient PCP: No primary care provider on file.  Discharge Diagnoses    Upper gi bleed Acute blood loss anemia Acute pulmonary embolism History of pulmonary embolism History of lymphoma  Hospital Course by Problem List    Sean Hodges is a 84 y.o. male w PMH of CAD, follicular lymphoma, hypertension, mixed dyslipidemia, history of cardiomyopathy (low normal EF 50-55% 11/2023) who was transferred from [facility] for acute PE complicated by GI bleed. Pt was admitted in early December with SBO that required ex lap. He was discharged but went back to hospital 12/23 with shortness of breath, found to have Flu A+ and had new PE (CTA- right upper lobar pulmonary artery extending in its branches, no right heart strain or aortic dissection seen).  LE dopplers negative. He was started on heparin  infusion and transition to Eliquis, but developed large volume bloody stool after starting Eliquis. It appears that during his first hospitalization, a brief episode of melena had occurred after the surgery, and self resolved before PE developed.  He received 2 u pRBCs and Hb around 8 prior to the transfer.   # GIB in the setting of recent ex lap and anticoagulation # Acute blood loss anemia Patient reported ongoing melena that had not resolved over the past week. FOBT positive. Ferritin normal, iron sat normal, iron mildly low. GI consulted here and performed EGD on 1/3 which showed multiple small angioectasias in the body of the stomach, bleeding was observed and cauterized. Patient was maintained on IV PPI BID, for which he will continue by mouth BID once discharged for two more weeks, then  daily. He received three doses of IV iron while admitted to help with the anemia. Eliquis to be restarted after 48 hours, 1/6. Discussed this with patient and wife. Detailed risk and benefits discussed below.  # Acute PE # History of PE # history of lymphoma Had recent PE in the setting of surgery (Dec 2025). OSH CTA: Filling defect in right upper lobar pulmonary artery extending in its branches suggestive pulmonary embolism, no right heart strain or aortic dissection seen. Bibasal atelectatic bands are seen. Patient also notes previous clots associated with chemotherapy. Discussed patient with patient's primary oncologist, Dr. Ezzard via phone call 1/5. We discussed putting patient on Eliquis for 3 months for treatment of PE and then discontinue after that. Discussed with pharmacy here about deferring Eliquis load, since patient HDS, and continuing on 5 mg BID for full course. Signs and symptoms of bleeding discussed extensively with wife and patient. We discussed if he bleeds again he will need to have another discussion about continuation of AC, or not. He accepts the risk of GIB at this time and will start Eliquis after discharge, >48 hours after GI procedure, based on specialist recommendations. We discussed quick follow up with his PCP and primary oncologist.  The remainder of the patient's medical problems were chronic and stable without any further intervention this admission. The patient will continue the current treatments and medications.  Recommendations to Outpatient Physicians/Follow-Up Needed   Follow up with PCP Follow up with primary oncologist  Significant Studies  Labs on Discharge:  Recent Labs    Units  06/25/24 0413 06/24/24 0221  WBC thou/mcL 7.8 6.5  HGB gm/dL 8.4* 8.3*  HCT % 73.6* 27.4*  PLT thou/mcL 204 162   Recent Labs    Units 06/25/24 0413 06/24/24 0221  NA mmol/L 138 143  K mmol/L 3.9 3.9  CL mmol/L 103 106  CO2 mmol/L 27 29  BUN mg/dL 12 18  CREATININE  mg/dL 8.98 9.06  CALCIUM  mg/dL 8.2* 8.2*   Recent Labs    Units 06/25/24 0413 06/24/24 0221  BILITOT mg/dL 0.5 0.4  AST U/L 11 16  ALT U/L 42 60*  ALKPHOS U/L 77 83  ALBUMIN gm/dL 2.9* 3.0*   No results for input(s): TSH, HGBA1C in the last 168 hours. Recent Labs    Units 06/24/24 0221  INR  1.2  PTT second(s) 34   No results for input(s): CHOL, LDL, HDL, TRIG in the last 168 hours. No results for input(s): TROPONIN, CK in the last 168 hours.  Invalid input(s): CK-MB  Diagnostic findings:   Date of procedure: 06/24/2024   Procedure: EGD with Gold probe cautery of angioectasias   Indication: Anemia and GI bleeding   Postprocedure Diagnosis:  Multiple small gastric angioectasias which were oozing.  These were cauterized with gold probe cautery   Impression:  The esophagus appeared normal.  Multiple small angioectasias in the body of the stomach; bleeding was observed; induced coagulation with bipolar cautery.  The duodenum appeared normal.      Recommendations: Monitor for further GI bleeding Hold anticoagulation IV PPI for at least 48 hours more Diet as tolerated   Findings: The esophagus appeared normal.  Multiple small angioectasias in the body of the stomach; bleeding was observed; induced coagulation with bipolar cautery probe.  The duodenum appeared normal.   Discharge Physical Examination  Vitals:  Vitals:   06/26/24 0744  BP: 119/66  Pulse: 90  Resp: 16  Temp: 97.7 F (36.5 C)  SpO2: 93%    Wt Readings from Last 1 Encounters:  06/24/24 84.4 kg (186 lb)    Physical Exam:   General: NAD, well-appearing HEENT: EOMI, PERRL, MMM Cardiovascular: RRR, no murmurs, no LE edema, extremities warm and well perfused  Respiratory: CTAB, no crackles or wheezes, normal work of breathing on room air Abdomen: SNTND, normoactive bowel sounds Neuro: A&Ox4.   Post-Hospital Care  Discharge destination: Home with home health   Potential  for rehab: Good  Discharge medications:   Current Discharge Medication List     PAUSE taking these medications as directed      Details  enalapril maleate 10 mg tablet Wait to take this until your doctor or other care provider tells you to start again. Commonly known as: VASOTEC  Take one tablet (10 mg dose) by mouth every morning. Pause reason: Other (Comment) Pause comment: BP normal       START taking these medications      Details  apixaban 5 mg tablet Commonly known as: ELIQUIS Start taking on: June 27, 2024  Take one tablet (5 mg dose) by mouth 2 (two) times daily. Indication: DVT/PE (3 months) Quantity: 180 tablet   pantoprazole sodium 40 mg tablet Commonly known as: PROTONIX Start taking on: June 27, 2024  Take one tablet (40 mg) twice a day for two weeks then take once daily, in the morning. Quantity: 74 tablet       CONTINUE these medications which have NOT CHANGED      Details  albuterol sulfate 2.5 mg/3 mL nebulizer solution Commonly known  as: PROVENTIL  Take 3 mLs (2.5 mg dose) by nebulization every 6 (six) hours as needed for Wheezing.   Calcium -Magnesium-Zinc-D3 333 MG-133 MG-8 MG-5 MCG Tabs  Take 1 tablet by mouth 2 (two) times daily.   co-enzyme Q-10 100 mg capsule Commonly known as: UBIQUINONE  Take one capsule (100 mg dose) by mouth every morning.   fish oil-omega-3 fatty acids 1000 MG capsule  Take one capsule (1 g dose) by mouth every morning.   fluorouracil 5 % cream Commonly known as: EFUDEX  Apply 1 g topically 2 (two) times a day as needed (carcinoma of skin).   glucosamine-chondroitin 500-400 mg per tablet  Take one tablet by mouth 2 (two) times daily.   lidocaine  5% Commonly known as: LIDODERM   Place one patch onto the skin every 24 (twenty-four) hours as needed (apply to back). Remove & Discard patch within 12 hours or as directed by MD   mupirocin 2 % ointment Commonly known as: BACTROBAN  Apply one Application  topically 3 (three) times a day as needed (apply to skin cancer areas).   OCUSOFT LID SCRUB ALLERGY Pads  Place 1 Pad into both eyes 2 (two) times daily.   polyethyl glycol-propyl glycol 0.4-0.3 % ophthalmic solution Commonly known as: SYSTANE  Place one drop into both eyes 2 (two) times daily.   rosuvastatin  calcium  20 mg tablet Commonly known as: CRESTOR   Take one tablet (20 mg dose) by mouth at bedtime.   vitamin B-12 1,000 mcg tablet Commonly known as: CYANOCOBALAMIN   Take one tablet (1,000 mcg dose) by mouth every morning.      * You might also be taking other medications not listed above. If you have questions about any of your other medications, talk to the person who prescribed them or your Primary Care Provider.          STOP taking these medications    aspirin EC tablet Commonly known as: ECOTRIN LOW DOSE   CALCIUM  CARB-MAGNESIUM CARB PO   famotidine 40 mg tablet Commonly known as: PEPCID   MYLANTA PO   omega-3 acid ethyl esters 1 g capsule Commonly known as: LOVAZA   pravastatin 80 MG tablet Commonly known as: PRAVACHOL   Saw Palmetto 500 MG Caps   VITAMIN D -1000 MAX ST 25 MCG (1000 UT) tablet Generic drug: cholecalciferol        Diet: Cardiac diet  Wound care orders:  Wound Orders (From admission, onward)     Start       06/27/24 0900  WOUND CARE     Comments: To abdominal surgical site: Gently cleanse with saline and gauze. Pat dry. Cover area with half of an Abd pad (cut in half). Secure with white paper tape.  *Change daily*  Order Comments: To abdominal surgical site: Gently cleanse with saline and gauze. Pat dry. Cover area with half of an Abd pad (cut in half). Secure with white paper tape. *Change daily*        06/26/24 2100  WOUND CARE     Comments: Reconsult wound/ostomy should appearance of skin/wound differ from previous wound care assessment on 06/26/24  Order Comments: Reconsult wound/ostomy should appearance of skin/wound  differ from previous wound care assessment on 06/26/24        06/26/24 2000  WOUND CARE     Comments: Buttocks and Heels: Clean area with moisturizer cleanser spray. apply moisture barrier ointment liberally (blue ointment).  APPLY BID AND PRN TURNS.  Order Comments: Buttocks and Heels: Clean area  with moisturizer cleanser spray. apply moisture barrier ointment liberally (blue ointment).  APPLY BID AND PRN TURNS.                  Follow-up Appointment Date and Time: Future Appointments  Date Time Provider Department Center  07/04/2024 10:00 AM Debby Lamar Kansky, MD [facility] None  07/10/2024  2:15 PM Cordella DELENA Minder, PA [facility]    Consults: GI  Code Status: No CPR  Patient education: The patient was educated on warning signs regarding the current medical conditions. If any of these issues were to arise or worsen, the patient was instructed to contact the PCP or seek further medical evaluation in the emergency room.  Time spent with discharge coordination care: 45 minutes   Lavanda CHRISTELLA Debby, MD [facility] Inpatient Care Specialist  06/26/2024 *Some images could not be shown.

## 2024-06-27 ENCOUNTER — Telehealth: Payer: Self-pay

## 2024-06-27 NOTE — Transitions of Care (Post Inpatient/ED Visit) (Signed)
" ° °  06/27/2024  Name: Sean Hodges MRN: [id number] DOB: [birth year]  Today's TOC FU Call Status: Today's TOC FU Call Status:: Successful TOC FU Call Completed (spoke with patient briefly who states home health is coming tomorrow and declined TOC call program - encouraged patient to schedule hospital f/u with PCP) TOC FU Call Complete Date: 06/27/24  Patient's Name and Date of Birth confirmed. DOB, Name  Transition Care Management Follow-up Telephone Call Date of Discharge: 06/26/24 Discharge Facility: Other [facility]) Name of Other (Non[facility]) Discharge Facility: [facility] Type of Discharge: Inpatient Admission Primary Inpatient Discharge Diagnosis:: Upper gi bleed   Shona Prow RN, CCM [place]  VBCI-Population Health RN Care Manager [phone]  "

## 2024-06-28 ENCOUNTER — Other Ambulatory Visit: Payer: Self-pay | Admitting: Oncology

## 2024-06-28 DIAGNOSIS — C8298 Follicular lymphoma, unspecified, lymph nodes of multiple sites: Secondary | ICD-10-CM

## 2024-06-28 NOTE — Progress Notes (Signed)
 " [facility] [address] [place],  [place]  [address] [phone]  Clinic Day:  06/29/2024  Referring physician: Conley Dene BROCKS., MD   HISTORY OF PRESENT ILLNESS:  The patient is an 84 y.o. male with recurrent low grade follicular lymphoma.  There is also history of diffuse large B cell lymphoma.  He last completed 6 cycles of Revlimid /Rituxan  in October 2023.  His PET afterwards showed a complete response to therapy.  He comes in today for routine follow-up.  However, the patient has had a complicated last few months, with respect to other health issues.  In December 2025, he was hospitalized for small bowel obstruction, which led to a segmental resection of bowel tissue.  Fortunately, there was no evidence of recurrent lymphoma.  Serosal adhesions and transmural ischemic enteritis were appreciated.  However, shortly after that surgery, he began having black stools.  Furthermore, he became acutely short of breath.  He went back to our local hospital, for which a CT angiogram was done, which revealed a segmental pulmonary embolus.  He was initially placed on heparin  and transitioned to Eliquis.  Unfortunately, during that transition, his black stools developed.  He was then transferred to a tertiary hospital.  While there, an EGD was done, which showed multiple bleeding angioectasias in the stomach, which were fulgurated.  They were thought to be the culprit behind his black stools.  Fortunately, since then, the patient has had no further black stools, despite being on Eliquis for his pulmonary embolus.  As his pulmonary embolus was thought to be iatrogenic in nature from his recent surgery and also influenza A, the plan is for him to be kept on Eliquis for 3 months.  He comes into clinic today doing fairly well.  Understandably, he is weak from all of his recent hospitalizations.  Of note, he was also transfused multiple units of blood and given IV iron.  He was  scheduled receive another course of IV iron, but due to a line infiltration, this was canceled.  As a pertains to his lymphoma history, the patient denied ever having any B symptoms, bulky lymphadenopathy, or other findings which have concerned him for possible disease recurrence.  With respect to his lymphoma history, his diffuse large B-cell lymphoma was initially diagnosed in May 2000 per biopsy of a mass in his left thyroid .  As CT scans showed evidence of disease above and below his diaphragm, he was given 6 cycles of CHOP chemotherapy, followed by weekly Rituxan  for 8 treatments.  His disease did well up until August 2008 when CT and PET scans showed evidence of disease recurrence.  At that time, a repeat biopsy showed follicular lymphoma, which led to him undergoing 6 cycles of Rituxan -CVP.  The plan was to continue maintenance Rituxan  for 2 years, but the patient only received 1 cycle of maintenance Rituxan  before discontinuing it in early 2009.  The patient has had biopsies and resections of a fleshy skin nodule behind his right ear, whose pathology revealed follicular lymphoma, but with focal areas within it suggesting potential transformation into a more aggressive subtype.  However, this area never flared up to where intervention has been necessary.  He began having problems with his right eyelid, for which a biopsy of the area confirmed the presence of recurrent low-grade follicular lymphoma.  This biopsy and systemic disease spread on a PET scan led to him being placed on Revlimid /Rituxan , which was his first systemic therapy since 2009.  He completed 6 cycles of this regimen in October 2023, with a PET scan afterwards showing a complete response.     PHYSICAL EXAM:  There were no vitals taken for this visit. Wt Readings from Last 3 Encounters:  05/24/24 192 lb 12.8 oz (87.5 kg)  12/10/23 187 lb 11.2 oz (85.1 kg)  08/27/23 192 lb 6.4 oz (87.3 kg)   There is no height or weight on file to  calculate BMI. Performance status (ECOG): 1 - Symptomatic but completely ambulatory Physical Exam Constitutional:      Appearance: Normal appearance. He is not ill-appearing.  HENT:     Mouth/Throat:     Mouth: Mucous membranes are moist.     Pharynx: Oropharynx is clear. No oropharyngeal exudate or posterior oropharyngeal erythema.  Cardiovascular:     Rate and Rhythm: Normal rate and regular rhythm.     Heart sounds: No murmur heard.    No friction rub. No gallop.  Pulmonary:     Effort: Pulmonary effort is normal. No respiratory distress.     Breath sounds: Normal breath sounds. No wheezing, rhonchi or rales.  Abdominal:     General: Bowel sounds are normal. There is no distension.     Palpations: Abdomen is soft. There is no mass.     Tenderness: There is no abdominal tenderness.  Musculoskeletal:        General: No swelling.     Right lower leg: No edema.     Left lower leg: No edema.  Lymphadenopathy:     Cervical: No cervical adenopathy.     Upper Body:     Right upper body: No supraclavicular or axillary adenopathy.     Left upper body: No supraclavicular or axillary adenopathy.     Lower Body: No right inguinal adenopathy. No left inguinal adenopathy.  Skin:    General: Skin is warm.     Coloration: Skin is not jaundiced.     Findings: No lesion or rash.  Neurological:     General: No focal deficit present.     Mental Status: He is alert and oriented to person, place, and time. Mental status is at baseline.  Psychiatric:        Mood and Affect: Mood normal.        Behavior: Behavior normal.        Thought Content: Thought content normal.    LABS:      Latest Ref Rng & Units 06/29/2024    8:42 AM 12/10/2023    9:42 AM 06/11/2023    9:25 AM  CBC  WBC 4.0 - 10.5 K/uL 6.9  7.3  7.3   Hemoglobin 13.0 - 17.0 g/dL 9.3  86.4  85.5   Hematocrit 39.0 - 52.0 % 29.0  40.5  41.6   Platelets 150 - 400 K/uL 171  144  175       Latest Ref Rng & Units 06/29/2024    8:42  AM 12/10/2023    9:42 AM 08/16/2023    8:33 AM  CMP  Glucose 70 - 99 mg/dL 888  92  895   BUN 8 - 23 mg/dL 15  28  21    Creatinine 0.61 - 1.24 mg/dL 8.94  8.72  8.81   Sodium 135 - 145 mmol/L 141  139  141   Potassium 3.5 - 5.1 mmol/L 3.7  4.6  4.4   Chloride 98 - 111 mmol/L 105  103  101   CO2 22 - 32 mmol/L 26  27  24   Calcium  8.9 - 10.3 mg/dL 9.3  9.9  9.4   Total Protein 6.5 - 8.1 g/dL 5.8  6.4  6.8   Total Bilirubin 0.0 - 1.2 mg/dL 0.5  0.4  0.6   Alkaline Phos 38 - 126 U/L 72  56  54   AST 15 - 41 U/L 13  10  11    ALT 0 - 44 U/L 19  12  16     Lab Results  Component Value Date   LDH 233 06/29/2024   LDH 143 12/10/2023   LDH 122 06/11/2023    ASSESSMENT & PLAN:  Assessment/Plan:  An 83 y.o. male with biopsy-proven recurrence of low-grade follicular lymphoma.  He also has a history of diffuse large B-cell lymphoma.  However, based upon his physical exam, scans, recent pathology and labs, the patient remains disease free.  As mentioned previously, he was recently hospitalized with a small bowel obstruction, a pulmonary embolus and an upper GI bleed.  It appears his SBO has been surgically corrected.  He is currently on Eliquis 5 mg BID, which he will take for 3 months.  He knows to contact our office over these next weeks if he develops another GI bleed, for which I would tell him to hold his Eliquis.  I will also arrange for him to receive IV Monoferric  (iron) to further bolster his iron stores and improve his hemoglobin.  I will see him back in 3 months to reassess his anemia and to ensure he has no more problems with blood clots before he comes off of his Eliquis.  The patient understands all the plans discussed today and is in agreement with them.     Renee Erb DELENA Kerns, MD       "

## 2024-06-29 ENCOUNTER — Encounter: Payer: Self-pay | Admitting: Oncology

## 2024-06-29 ENCOUNTER — Inpatient Hospital Stay: Admitting: Oncology

## 2024-06-29 ENCOUNTER — Other Ambulatory Visit: Payer: Self-pay | Admitting: Oncology

## 2024-06-29 ENCOUNTER — Telehealth: Payer: Self-pay | Admitting: Oncology

## 2024-06-29 ENCOUNTER — Inpatient Hospital Stay: Attending: Oncology

## 2024-06-29 VITALS — BP 133/66 | HR 96 | Temp 98.2°F | Resp 16 | Ht 69.0 in | Wt 178.2 lb

## 2024-06-29 DIAGNOSIS — C8298 Follicular lymphoma, unspecified, lymph nodes of multiple sites: Secondary | ICD-10-CM

## 2024-06-29 DIAGNOSIS — C8218 Follicular lymphoma grade II, lymph nodes of multiple sites: Secondary | ICD-10-CM | POA: Diagnosis not present

## 2024-06-29 DIAGNOSIS — D509 Iron deficiency anemia, unspecified: Secondary | ICD-10-CM | POA: Insufficient documentation

## 2024-06-29 LAB — CMP (CANCER CENTER ONLY)
ALT: 19 U/L (ref 0–44)
AST: 13 U/L — ABNORMAL LOW (ref 15–41)
Albumin: 3.6 g/dL (ref 3.5–5.0)
Alkaline Phosphatase: 72 U/L (ref 38–126)
Anion gap: 9 (ref 5–15)
BUN: 15 mg/dL (ref 8–23)
CO2: 26 mmol/L (ref 22–32)
Calcium: 9.3 mg/dL (ref 8.9–10.3)
Chloride: 105 mmol/L (ref 98–111)
Creatinine: 1.05 mg/dL (ref 0.61–1.24)
GFR, Estimated: >60 mL/min
Glucose, Bld: 111 mg/dL — ABNORMAL HIGH (ref 70–99)
Potassium: 3.7 mmol/L (ref 3.5–5.1)
Sodium: 141 mmol/L (ref 135–145)
Total Bilirubin: 0.5 mg/dL (ref 0.0–1.2)
Total Protein: 5.8 g/dL — ABNORMAL LOW (ref 6.5–8.1)

## 2024-06-29 LAB — CBC WITH DIFFERENTIAL (CANCER CENTER ONLY)
Abs Immature Granulocytes: 0.03 K/uL (ref 0.00–0.07)
Basophils Absolute: 0 K/uL (ref 0.0–0.1)
Basophils Relative: 0 %
Eosinophils Absolute: 0.2 K/uL (ref 0.0–0.5)
Eosinophils Relative: 2 %
HCT: 29 % — ABNORMAL LOW (ref 39.0–52.0)
Hemoglobin: 9.3 g/dL — ABNORMAL LOW (ref 13.0–17.0)
Immature Granulocytes: 0 %
Lymphocytes Relative: 20 %
Lymphs Abs: 1.3 K/uL (ref 0.7–4.0)
MCH: 29.1 pg (ref 26.0–34.0)
MCHC: 32.1 g/dL (ref 30.0–36.0)
MCV: 90.6 fL (ref 80.0–100.0)
Monocytes Absolute: 0.8 K/uL (ref 0.1–1.0)
Monocytes Relative: 12 %
Neutro Abs: 4.5 K/uL (ref 1.7–7.7)
Neutrophils Relative %: 66 %
Platelet Count: 171 K/uL (ref 150–400)
RBC: 3.2 MIL/uL — ABNORMAL LOW (ref 4.22–5.81)
RDW: 17.3 % — ABNORMAL HIGH (ref 11.5–15.5)
WBC Count: 6.9 K/uL (ref 4.0–10.5)
nRBC: 0 % (ref 0.0–0.2)

## 2024-06-29 LAB — IRON AND TIBC
Iron: 46 ug/dL (ref 45–182)
Saturation Ratios: 19 % (ref 17.9–39.5)
TIBC: 245 ug/dL — ABNORMAL LOW (ref 250–450)
UIBC: 200 ug/dL

## 2024-06-29 LAB — LACTATE DEHYDROGENASE: LDH: 233 U/L (ref 105–235)

## 2024-06-29 LAB — FERRITIN: Ferritin: 624 ng/mL — ABNORMAL HIGH (ref 24–336)

## 2024-06-29 NOTE — Telephone Encounter (Signed)
 Patient has been scheduled for follow-up visit per 06/29/2024 LOS.  Pt given an appt calendar with date and time.

## 2024-06-30 ENCOUNTER — Encounter: Payer: Self-pay | Admitting: Oncology

## 2024-06-30 ENCOUNTER — Inpatient Hospital Stay

## 2024-06-30 VITALS — BP 117/55 | HR 78 | Temp 98.6°F | Resp 18 | Ht 69.0 in

## 2024-06-30 DIAGNOSIS — D509 Iron deficiency anemia, unspecified: Secondary | ICD-10-CM | POA: Insufficient documentation

## 2024-06-30 MED ORDER — SODIUM CHLORIDE 0.9 % IV SOLN
1000.0000 mg | Freq: Once | INTRAVENOUS | Status: AC
  Administered 2024-06-30: 1000 mg via INTRAVENOUS
  Filled 2024-06-30: qty 1000

## 2024-06-30 MED ORDER — SODIUM CHLORIDE 0.9 % IV SOLN: INTRAVENOUS | Status: DC

## 2024-06-30 MED ORDER — LORATADINE 10 MG PO TABS
10.0000 mg | ORAL_TABLET | Freq: Once | ORAL | Status: AC
  Administered 2024-06-30: 10 mg via ORAL
  Filled 2024-06-30: qty 1

## 2024-06-30 MED ORDER — ACETAMINOPHEN 325 MG PO TABS
650.0000 mg | ORAL_TABLET | Freq: Once | ORAL | Status: AC
  Administered 2024-06-30: 650 mg via ORAL
  Filled 2024-06-30: qty 2

## 2024-06-30 NOTE — Patient Instructions (Signed)

## 2024-07-05 ENCOUNTER — Inpatient Hospital Stay: Admitting: Dietician

## 2024-07-05 ENCOUNTER — Telehealth: Payer: Self-pay | Admitting: Dietician

## 2024-07-05 NOTE — Telephone Encounter (Signed)
 Patient screened on MST. First attempt to reach. Provided my cell# on voice mail to return call to set up a nutrition consult.  Micheline Craven, RDN, LDN Registered Dietitian, [facility] Part Time Remote (Usual office hours: Tuesday-Thursday) Cell: [phone]

## 2024-07-05 NOTE — Progress Notes (Signed)
 Nutrition Assessment  Patient returned message left today.  Reason for Assessment: MST screen for weight loss.    ASSESSMENT: The patient is an 84 y.o. male with recurrent low grade follicular lymphoma.  PMH diffuse large B cell lymphoma. Past  December 2025, he was hospitalized for small bowel obstruction, which led to a segmental resection of bowel.  HGB low r/t losses in stools. Patient reports stools no longer black, but have been hard and uncomfortable to pass. Using Children's Miralax to help.  Nothing taste good, no appetite, no energy.  Eating but used to eat salads and vegetables prior to bowel obstruction.  Usual intake now: Breakfast: scramble eggs toast or oatmeal now Boost Chocolate Chicken salad sandwich for lunch and dinner Pot of chili freezes it and takes small bowls. Fruit (orange, apple, blueberries) orange sherbet at night Fluids: water uses a pint size cup now getting 3-4  pints was only doing 2.  Also 3 cups coffee in morning and hot green tea.    Anthropometrics:   Height: 69 Weight: 06/29/24  178.3# UBW: 190-195# BMI: 26.32    NUTRITION DIAGNOSIS: Inadequate PO intake to meet increased nutrient needs, r/t anorexia, taste changes and SBO  INTERVENTION:  Relayed that nutrition services are wrap around service provided at no charge and encouraged continued communication if experiencing continued weight loss or any nutritional impact symptoms (NIS). Educated on importance of adequate calorie and protein energy intake  with nutrient dense foods when possible to maintain weight/strength. Encouraged weekly weight at home to monitor trend. Reviewed strategies for anemia. Encouraged soft soluble fibers and cautioned against over consuming insoluble fibers with recent SBO. Suggested oral nutrition supplement 1.5 cal/ml or increasing to BID to help maintain weight.  Emailed Nutrition Tip sheet  for  anemia with contact information provided   MONITORING, EVALUATION,  GOAL: weight, PO intake, Nutrition Impact Symptoms, labs Goal is weight maintenance  Next Visit: Remote next month  Micheline Craven, RDN, LDN Registered Dietitian, [facility] Part Time Remote (Usual office hours: Tuesday-Thursday) Cell: [phone]

## 2024-07-06 ENCOUNTER — Other Ambulatory Visit: Payer: Self-pay | Admitting: Cardiology

## 2024-07-11 ENCOUNTER — Other Ambulatory Visit: Payer: Self-pay | Admitting: Cardiology

## 2024-07-12 ENCOUNTER — Other Ambulatory Visit: Payer: Self-pay | Admitting: Cardiology

## 2024-07-19 ENCOUNTER — Other Ambulatory Visit: Payer: Self-pay

## 2024-07-19 DIAGNOSIS — I2699 Other pulmonary embolism without acute cor pulmonale: Secondary | ICD-10-CM

## 2024-07-19 MED ORDER — APIXABAN 5 MG PO TABS: 5.0000 mg | ORAL_TABLET | Freq: Two times a day (BID) | ORAL | 0 refills | Status: AC

## 2024-08-02 ENCOUNTER — Inpatient Hospital Stay: Attending: Oncology | Admitting: Dietician

## 2024-09-27 ENCOUNTER — Inpatient Hospital Stay: Admitting: Oncology

## 2024-09-27 ENCOUNTER — Inpatient Hospital Stay
